# Patient Record
Sex: Female | Born: 1990
Health system: Southern US, Community
[De-identification: ages and names within clinical notes are randomized; demographics above are authoritative.]

## PROBLEM LIST (undated history)

## (undated) DIAGNOSIS — F909 Attention-deficit hyperactivity disorder, unspecified type: Secondary | ICD-10-CM

## (undated) DIAGNOSIS — M1990 Unspecified osteoarthritis, unspecified site: Secondary | ICD-10-CM

## (undated) DIAGNOSIS — F410 Panic disorder [episodic paroxysmal anxiety] without agoraphobia: Secondary | ICD-10-CM

## (undated) DIAGNOSIS — J45909 Unspecified asthma, uncomplicated: Secondary | ICD-10-CM

## (undated) DIAGNOSIS — K8020 Calculus of gallbladder without cholecystitis without obstruction: Secondary | ICD-10-CM

## (undated) HISTORY — DX: Panic disorder (episodic paroxysmal anxiety): F41.0

---
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
## 2010-12-01 ENCOUNTER — Emergency Department
Admission: EM | Admit: 2010-12-01 | Disposition: A | Discharge status: Home / Self Care | Payer: Self-pay | Source: Ambulatory Visit

## 2011-11-02 ENCOUNTER — Emergency Department
Admission: EM | Admit: 2011-11-02 | Disposition: A | Discharge status: Home / Self Care | Payer: Self-pay | Source: Ambulatory Visit

## 2012-01-12 ENCOUNTER — Emergency Department
Admission: EM | Admit: 2012-01-12 | Disposition: A | Discharge status: Home / Self Care | Payer: Self-pay | Source: Ambulatory Visit

## 2012-03-13 ENCOUNTER — Emergency Department
Admission: EM | Admit: 2012-03-13 | Disposition: A | Discharge status: Home / Self Care | Payer: Self-pay | Source: Ambulatory Visit

## 2012-08-13 NOTE — ED Notes (Signed)
[facility]                          EMERGENCY DEPARTMENT REPORT              NAME: Rachael Bradford, Rachael Bradford          DOB:         04-16-1990       MR#:  [id number]                            ACCT#:      [id number]              _________________________________________________________________       DATE OF VISIT:       10/25/2009              BED NUMBER:       25.              TIME OF VISIT:       The patient was seen at 8:10 p.m.              CHIEF COMPLAINT:       "My back and chest hurt."              PRE-HOSPITAL CARE:       None.              HISTORY OF PRESENT ILLNESS:       This is a 22 year old female.  She complains of pain which       started in her upper back.  She denies any injury or trauma,       recent heavy lifting or any exertional activity that would have       caused pain in this area.  She states the pain radiates into her       chest and across her back.  She denies any abdominal pain or       vomiting.  She states the pain is worse with deep inspiration.       She does complain of pain which radiates around to her chest as       well.  She denies any history of heart disease.  She has no       family history of heart disease.  However, mother did have a       pulmonary embolus.              REVIEW OF SYSTEMS:       No nausea, vomiting.  No headache.  No fever, chills.  Positive       nonproductive cough.  Positive chest pain.  No shortness of       breath.  No abdominal pain.  No urinary frequency, diarrhea, or       skin rash.  All other systems negative except as noted per HPI.              PAST MEDICAL HISTORY:       None.              MEDICATIONS:       None.              ALLERGIES:       None.              FAMILY HISTORY:       Significant for PE.  SOCIAL HISTORY:       No current use of alcohol, tobacco, illicit drugs.              PHYSICAL EXAMINATION:       VITAL SIGNS:  Temperature is 36.9, pulse 114, respiratory rate       18, BP  142/92, pulse ox 100% on room air.  Nurse's notes are       reviewed by myself.              GENERAL:  Awake, alert 22 year old female, sitting up on the       stretcher.  She appears somewhat uncomfortable.  She is       nontoxic.       HEENT:  Head and scalp show no contusions, lacerations,       abrasions.  Conjunctivae not pale.  Nares show no nasal       discharge.  Oropharynx moist without lesions.       NECK:  Supple.  No lymphadenopathy, JVD or thyromegaly.       CARDIOVASCULAR:  Slightly tachycardic without murmurs, gallops,       rubs.       LUNGS:  Clear to auscultation in all fields without rales,       rhonchi, or wheezing.  There is some splinting on deep       inspiration.       CHEST WALL:  Nontender to palpation.       ABDOMEN:  Soft, nontender in all quadrants.  No guarding,       rebound, or peritoneal signs.       BACK:  She has mild diffuse tenderness to palpation diffusely       across the entire thoracic area.  Lumbar area is unremarkable.       There is no focal bony point tenderness.  Pelvis is stable.       EXTREMITIES:  Warm, well perfused.  No calf swelling or       tenderness.       SKIN:  Warm, dry, without rashes.       NEUROLOGIC:  She is awake, alert, appropriate without focal       deficits.              IMPRESSION:       This is a 22 year old female with a 1-week history of       progressive back and chest pain.  She is tachycardiac.  Pain is       worse with deep inspiration.  Family history of pulmonary       embolus.  Certainly concern for possible pulmonary embolism.       This could also represent musculoskeletal chest wall and back       pain which is more likely.  Since she has been coughing       frequently this could be diagnosis as well.  I would also have       to consider pneumonia or pneumothorax; however, I think less       likely.  At this point, I will get a CT scan of her chest, chest       x-ray, CBC, Chem 8 for further evaluation.              ANCILLARY SERVICES  PERFORMED:       CBC shows white count 10.1, H and H 15.1 and 43.9, platelet       count  266.  Chest x-ray shows no acute abnormalities.  CT scan       of the chest shows no evidence of pulmonary embolus.  Chem 8 is       unremarkable.              EMERGENCY DEPARTMENT COURSE AND TREATMENT:       The patient remained stable during the course in the emergency       room.  I discussed with her and her family the findings.  EKG       was also performed which showed sinus rhythm with right axis       deviation.  No ischemic changes are noted.  I discussed with the       family the findings.  I suspect with negative CT of her chest       this is more likely musculoskeletal in origin.  I will treat her       with Toradol for pain.  She is to follow up with primary care       physician.  She is return for increasing pain, shortness of       breath, worsening symptoms or other concerns.              FINAL DIAGNOSES:       1.  Thoracic muscle strain.       2.  Chest pain.                                   **PRELIMINARY REPORT UNLESS SIGNED**       SEE DOCUMENT IMAGING SYSTEM FOR FINAL REPORT                                 ________________________________                              Myna Bright, MD                                            ID:   [id number]       DocType:   [id number]       \:   JR       /:   338       DD:  10/25/2009 22:46:35       DT:  10/26/2009 16:42:02       JOB: [id number]       >  Authenticated by Mariea Stable MD On 11/03/2009 09:48:11 AM

## 2012-09-11 ENCOUNTER — Emergency Department
Admission: EM | Admit: 2012-09-11 | Discharge: 2012-09-11 | Disposition: A | Discharge status: Home / Self Care | Payer: Self-pay | Attending: Emergency Medicine | Admitting: Emergency Medicine

## 2012-09-11 ENCOUNTER — Emergency Department: Payer: Self-pay

## 2012-09-11 DIAGNOSIS — G43909 Migraine, unspecified, not intractable, without status migrainosus: Secondary | ICD-10-CM | POA: Insufficient documentation

## 2012-09-11 DIAGNOSIS — Z87891 Personal history of nicotine dependence: Secondary | ICD-10-CM | POA: Insufficient documentation

## 2012-09-11 LAB — ECG 12-LEAD
P Wave Axis: 56 deg
P Wave Duration: 118 ms
P-R Interval: 176 ms
Patient Age: 22 years
Q-T Dispersion: 56 ms
Q-T Interval(Corrected): 433 ms
Q-T Interval: 346 ms
QRS Axis: 86 deg
QRS Duration: 102 ms
T Axis: 48 deg
Ventricular Rate: 94 /min

## 2012-09-11 MED ORDER — PROMETHAZINE HCL 25 MG/ML IJ SOLN
INTRAMUSCULAR | Status: AC
Start: 2012-09-11 — End: ?
  Filled 2012-09-11: qty 1

## 2012-09-11 MED ORDER — DIPHENHYDRAMINE HCL 50 MG/ML IJ SOLN
25.0000 mg | Freq: Once | INTRAMUSCULAR | Status: AC
Start: 2012-09-11 — End: 2012-09-11
  Administered 2012-09-11: 25 mg via INTRAVENOUS

## 2012-09-11 MED ORDER — KETOROLAC TROMETHAMINE 30 MG/ML IJ SOLN
30.0000 mg | Freq: Once | INTRAMUSCULAR | Status: AC
Start: 2012-09-11 — End: 2012-09-11
  Administered 2012-09-11: 30 mg via INTRAVENOUS

## 2012-09-11 MED ORDER — PROMETHAZINE HCL 25 MG/ML IJ SOLN
12.5000 mg | Freq: Once | INTRAMUSCULAR | Status: AC
Start: 2012-09-11 — End: 2012-09-11
  Administered 2012-09-11: 12.5 mg via INTRAVENOUS

## 2012-09-11 MED ORDER — DIPHENHYDRAMINE HCL 50 MG/ML IJ SOLN
INTRAMUSCULAR | Status: AC
Start: 2012-09-11 — End: ?
  Filled 2012-09-11: qty 1

## 2012-09-11 MED ORDER — KETOROLAC TROMETHAMINE 30 MG/ML IJ SOLN
INTRAMUSCULAR | Status: AC
Start: 2012-09-11 — End: ?
  Filled 2012-09-11: qty 1

## 2012-09-11 NOTE — ED Provider Notes (Signed)
[facility] History and Physical Exam      Patient Name: Rachael Bradford,Rachael Bradford  Encounter Date:  09/11/2012  Attending Physician: Myna Bright, MD  PCP: Christa See, MD  Patient DOB:  07/29/1990  MRN:  [id number]  Room:  N5/N5-A      History of Presenting Illness     Chief complaint: Migraine and Chest Pain    HPI/ROS is limited by: none  HPI/ROS given by: patient    Location: frontal head  Duration: 1 day  Severity: moderate    Rachael Bradford is a 22 y.o. female who presents with a migraine HA  Headache: Patient complains of headache. She does have a headache at this time.  She also starting having sharp pain in her chest 2 hours ago.      Description of Headaches:  Location of pain: retro-orbital  Radiation of pain?:occipital  Character of pain:sharp and throbbing  Severity of pain: 8  Accompanying symptoms: photophobia  Prodromal sx?: none  Rapidity of onset: gradual  Typical duration of individual headache: several year  Are most headaches similar in presentation? yes  Typical precipitants:none  Temporal Pattern of Headaches:  Started having HAs several years ago  Worst time of day: no pattern  Awaken from sleep?: no  Seasonal pattern?: no  'Clustering' of HAs over time? no  Overall pattern since problem began: unchanged    Degree of Functional Impairment: mild    Current Use of Meds to Treat HA:  Abortive meds? acetaminophen, NSAIDs)  Daily use? no  Prophylactic meds? none    Additional Relevant History:  History of head/neck trauma? no  History of head/neck surgery? no  Family h/o headache problems? no  Use of meds that might worsen HAs? no  Exposure to carbon monoxide? no  Substance use: caffeine: pt was drinking a "coke" when sx started      Review of Systems     Review of Systems   Constitutional: Negative for fever, chills, diaphoresis and fatigue.   HENT: Negative for ear pain, nosebleeds, congestion, sore throat, neck pain and dental problem.    Eyes: Negative for  pain, discharge, redness and itching.   Respiratory: Positive for chest tightness. Negative for cough, shortness of breath and wheezing.    Cardiovascular: Negative for chest pain, palpitations and leg swelling.   Gastrointestinal: Negative for nausea, vomiting, abdominal pain, diarrhea, constipation and blood in stool.   Genitourinary: Negative for dysuria, urgency, frequency, hematuria, flank pain, decreased urine volume and difficulty urinating.   Musculoskeletal: Negative for myalgias, back pain and joint swelling.   Skin: Negative for pallor, rash and wound.   Neurological: Positive for headaches. Negative for dizziness, syncope, speech difficulty, weakness and numbness.   Hematological: Negative for adenopathy. Does not bruise/bleed easily.   Psychiatric/Behavioral: Negative for suicidal ideas, confusion and agitation.   All other systems reviewed and are negative.      Allergies     Pt  has no known allergies.    Medications     No current outpatient prescriptions on file.     Past Medical History     Pt has no past medical history on file.    Past Surgical History     Pt has no past surgical history on file.    Family History     The family history is not on file.    Social History     Pt reports that she has never smoked. She does not have any smokeless  tobacco history on file. She reports that she drinks alcohol. She reports that she does not use illicit drugs.    Physical Exam     Blood pressure 131/81, pulse 83, temperature 98.2 F (36.8 C), resp. rate 18, height 1.6 m, weight 88.6 kg, last menstrual period 09/09/2012, SpO2 98.00%.  Physical Exam   Nursing note and vitals reviewed.  Constitutional: She is oriented to person, place, and time and well-developed, well-nourished, and in no distress.   HENT:   Head: Normocephalic and atraumatic.   Eyes: Conjunctivae normal and EOM are normal.   Neck: Normal range of motion. Neck supple.   Cardiovascular: Normal rate, regular rhythm and normal heart sounds.     Pulmonary/Chest: Effort normal and breath sounds normal. No respiratory distress.   Abdominal: Soft. There is no tenderness.   Musculoskeletal: Normal range of motion.   Neurological: She is alert and oriented to person, place, and time. She displays facial symmetry. She has a normal Finger-Nose-Finger Test and a normal Romberg Test. She shows no pronator drift. Gait normal. Gait normal.   Reflex Scores:       Patellar reflexes are 2+ on the right side and 2+ on the left side.  Skin: Skin is warm and dry.   Psychiatric: Affect and judgment normal.       Orders Placed     Orders Placed This Encounter   Procedures   . ECG 12 lead       Diagnostic Results         The results of the diagnostic studies below have been reviewed by myself:  EKG- nsr rate 94  Labs  Results     ** No Results found for the last 24 hours. **          Radiologic Studies  Radiology Results (24 Hour)     ** No Results found for the last 24 hours. **                MDM / Critical Care     Blood pressure 131/81, pulse 83, temperature 98.2 F (36.8 C), resp. rate 18, height 1.6 m, weight 88.6 kg, last menstrual period 09/09/2012, SpO2 98.00%.    This patient presents to the Emergency Department with a headache.  Based on my history and examination, several differential diagnoses including cluster headache, tension headache, sinusitis, and migraine have been considered.  Serious or potentially life-threatening causes of the patient's symptoms like meningitis or causes of brain swelling seem unlikely. The patient seems improved, is neurologically intact, and can be discharged home and managed with symptomatic care.  I advised the patient to return to the emergency department immediately should they develop worsening pain, fever, or any acute concerns .  Diagnostic impression and plan were discussed with the patient and/or family.  If ordered, results of lab/radiology tests were reviewed and discussed with the patient and/or family. Questions were  answered and concerns were addressed.  The patient was encouraged to follow-up with their primary care provider or specialist if not improved.      Procedures           Diagnosis / Disposition     Clinical Impression  1. Migraine without status migrainosus, not intractable        Disposition  ED Disposition     Discharge Caprisha Bridgett Marmolejos discharge to home/self care.    Condition at discharge: Good            Prescriptions  New Prescriptions  No medications on file                   Myna Bright, MD  09/11/12 2006

## 2012-09-11 NOTE — ED Notes (Signed)
Frontal migraine since last night, midsternal chest pain started 2 hrs ago. Posterior neck pain but able to move well. Mild sinus congestion.

## 2012-09-11 NOTE — Discharge Instructions (Signed)
Migraine Headache  Migraine headaches are related to changes in blood flow to the brain. This causes throbbing or constant pain on one or both sides of the head. The pain may last from a few hours to several days. There is usually nausea, vomiting, sensitivity to light and sound, and blurred vision. A migraine attack may be triggered by emotional stress, hormone changes during the menstrual cycle, oral contraceptives, alcohol use, certain foods containing tyramine, eye strain, weather changes, missing meals, or too little or too much sleep.  Home Care For This Headache:  1) If you were given pain medicine for this headache, do not drive yourself home . Arrange for a ride, instead. When you get home, try to sleep. You should feel much better when you wake up.  2) Migraine headaches may improve with an ice pack on the forehead or at the base of the skull. Heat to the back of your neck may relieve any neck spasm.  3) Drink only clear liquids or eat a very light diet to avoid nausea/vomiting until symptoms improve.  Preventing Future Headaches:  1) Pay attention to those factors that seem to trigger your headache. Try to avoid them when you can. If you have frequent headaches, it is useful to keep a diary of what you were doing, feeling or eating in the hours before each attack. Show this to your doctor to help find the cause of your headaches.  a) If you feel that stress is a factor in your headaches, look at the sources of stress in your life. Find ways to release the build-up of those stresses by using regular exercise, relaxation methods (yoga, meditation), bio-feedback or simply taking time-out for yourself. For more information about this, consult your doctor or go to a local bookstore and review books and tapes on this subject.  b) Tyramine is a substance present in the following foods : chocolate, yogurt, all cheeses except cottage cheese and cream cheese. smoked or pickled fish and meat (including herring,  caviar, bologna, pepperoni, salami), liver, avocados, bananas, figs, raisins, and red wine. Be aware that these foods may trigger a migraine in some persons. Try taking these foods out of your diet for 1-2 months to see if this reduces headache frequency.  Treating Future Attacks:  1) At the first sign of a headache, take time out if possible. Find a quiet, dark, comfortable place to sit or lie down. Let yourself relax or sleep.  2) An ice pack on the forehead or area of greatest pain may help. If you are having muscle spasm and tightness of the neck, a heating pad and massage to this area may be helpful.  3) If you have been prescribed a medicine to stop a migraine headache, use this at the very first warning sign of the headache (aura or initial pain) for best results.  Follow Up  with your doctor if the headache is not better within the next 24 hours. If you have frequent headaches you should discuss a treatment plan with your primary care doctor. Ask if you can have medicine to take at home the next time you get a bad headache. Poorly controlled chronic headaches may require a referral to a neurologist (headache specialist).  Get Prompt Medical Attention  if any of the following occur:   Your head pain gets worse, or does not improve within 24 hours   Repeated vomiting (can't keep liquids down)   Sinus or ear or throat pain (not already reported)     Fever of 100.4 F (38 C) or higher, or as directed by your healthcare provider   Stiff neck   Extreme drowsiness, confusion or fainting   Dizziness, vertigo (dizziness with spinning sensation)   Weakness of an arm or leg or one side of the face   Difficulty with speech or vision   2000-2014 [employer], [address], [place] [address]. All rights reserved. This information is not intended as a substitute for professional medical care. Always follow your healthcare professional's instructions.

## 2013-04-28 LAB — ECG 12-LEAD
Interpretation Text: BORDERLINE
P Wave Axis: 53 deg
P Wave Axis: 59 deg
P Wave Duration: 104 ms
P Wave Duration: 106 ms
P-R Interval: 168 ms
P-R Interval: 206 ms
Patient Age: 17 years
Patient Age: 19 years
Q-T Dispersion: 56 ms
Q-T Dispersion: 54 ms
Q-T Interval(Corrected): 397 ms
Q-T Interval(Corrected): 392 ms
Q-T Interval: 368 ms
Q-T Interval: 342 ms
QRS Axis: 95 deg
QRS Axis: 93 deg
QRS Duration: 98 ms
QRS Duration: 100 ms
T Axis: 59 deg
T Axis: 49 deg
Ventricular Rate: 77 /min
Ventricular Rate: 89 /min

## 2013-08-15 ENCOUNTER — Emergency Department: Payer: Self-pay

## 2013-08-15 ENCOUNTER — Emergency Department
Admission: EM | Admit: 2013-08-15 | Discharge: 2013-08-15 | Disposition: A | Discharge status: Home / Self Care | Payer: Self-pay | Attending: Emergency Medicine | Admitting: Emergency Medicine

## 2013-08-15 DIAGNOSIS — R519 Headache, unspecified: Secondary | ICD-10-CM

## 2013-08-15 DIAGNOSIS — R51 Headache: Secondary | ICD-10-CM | POA: Insufficient documentation

## 2013-08-15 MED ORDER — BUTALBITAL-APAP-CAFFEINE 50-325-40 MG PO TABS
1.0000 | ORAL_TABLET | ORAL | Status: AC | PRN
Start: 2013-08-15 — End: ?

## 2013-08-15 MED ORDER — IBUPROFEN 600 MG PO TABS
600.0000 mg | ORAL_TABLET | Freq: Four times a day (QID) | ORAL | Status: DC | PRN
Start: 2013-08-15 — End: 2013-08-21

## 2013-08-15 NOTE — ED Notes (Signed)
Pt has full ROM of neck but states it hurts when he moves her neck "sometimes". VSS. All system assessments clear.

## 2013-08-15 NOTE — ED Notes (Signed)
Pt was AAOx3. ABCs intact and no signs of distress noted. Steady Gait. No pt compliant upon leaving the ER. Pt stated that she understood her d/c instructions.

## 2013-08-15 NOTE — Discharge Instructions (Signed)
Headache [Unspecified]    The cause of your headache today is not clear, but it does not appear to be the sign of any serious illness.  Under stress, some people tense the muscles of their shoulder, neck and scalp without knowing it. If this condition lasts long enough, a TENSION HEADACHE can occur.  A MIGRAINE HEADACHE is caused by changes in blood flow to the brain. A migraine attack may be triggered by emotional stress, hormone changes during the menstrual cycle, oral contraceptives, alcohol use, certain foods containing tyramine, eye strain, weather changes, missing meals, lack of sleep or oversleeping.  Other causes of headache include a viral illness with high fever, head injury with concussion, sinus, ear or throat infection, dental pain and TMJ (jaw joint) pain. More serious but less common causes of headache include stroke, brain hemorrhage, brain tumor, meningitis and encephalitis.  Home Care:   If you were given pain medicine for this headache, do not drive yourself home. Arrange for a ride, instead. When you get home, try to sleep. You should feel much better when you wake up.   Apply heat to the back of your neck to relieve neck muscle spasm. Migraine headaches may respond best to an ice pack on the forehead or at the base of the skull.   If you are having nausea or vomiting, follow a light diet until your headache is relieved.   If you have a migraine type headache, use sunglasses when in the daylight or around bright indoor lighting until symptoms improve. Bright glaring light can worsen this kind of headache.  Follow Up  with your doctor if the headache is not better within the next 24 hours. If you have frequent headaches you should discuss a treatment plan with your primary care doctor. By being aware of the earliest signs of headache, and starting treatment right away, you may be able to stop the pain yourself.  Get Prompt Medical Attention  if any of the following occur:   Worsening of  your head pain or no improvement within 24 hours   Repeated vomiting (unable to keep liquids down)   Fever of 100.4F (38C) or higher, or as directed by your healthcare provider   Stiff neck   Extreme drowsiness, confusion or fainting   Dizziness, vertigo (dizziness with spinning sensation)   Weakness of an arm or leg or one side of the face   Difficulty with speech or vision   2000-2014 [employer], [address], [place] [address]. All rights reserved. This information is not intended as a substitute for professional medical care. Always follow your healthcare professional's instructions.

## 2013-08-15 NOTE — ED Notes (Signed)
Pt states that for the last week she has had horrible pain behind her right eye into her head. Pt states, " it is nothing like my usual headaches". Pt states it is a sharp pain that does not go away. AAOx4. PERRLA. ABCs intact and no signs of distress. Steady Gait.

## 2013-08-15 NOTE — ED Provider Notes (Signed)
[facility]  EMERGENCY DEPARTMENT  History and Physical Exam       Patient Name: Rachael, Bradford  Encounter Date:  08/15/2013  Physician Assistant: Boykin Peek, PA-C  Attending Physician: Dedra Skeens, MD  PCP: Christa See, MD  Patient DOB:  10/20/1990  MRN:  [id number]  Room:  E58/E58-A      History of Presenting Illness     Chief complaint: Headache    HPI/ROS given by: Patient    Rachael Bradford is a 23 y.o. female who presents with headache. Present x 1-2 weeks. Describes pain that originates either in the R temporal or R retroorbital area and then spreads across the R side of her head. Occasional photo/phonophobia. No nausea. No fever. No neck stiffness, but certain neck movements trigger the pain. Her pain also seems to be triggered by opening and closing her jaw.        Review of Systems     Review of Systems   Constitutional: Negative for fever, chills and unexpected weight change.   HENT: Negative for congestion, postnasal drip, rhinorrhea and sinus pressure.    Eyes: Positive for photophobia.   Respiratory: Negative for shortness of breath.    Cardiovascular: Negative for chest pain.   Gastrointestinal: Negative for nausea and vomiting.   Musculoskeletal: Positive for neck pain. Negative for neck stiffness.   Neurological: Positive for headaches. Negative for dizziness, weakness and numbness.   Hematological: Does not bruise/bleed easily.   Psychiatric/Behavioral: Negative for agitation.   All other systems reviewed and are negative.         Allergies & Medications     Pt has No Known Allergies.    Discharge Medication List as of 08/15/2013  7:37 PM           Past Medical History     Pt has no past medical history on file.     Past Surgical History     Pt  has no past surgical history on file.     Family History     The family history is not on file.     Social History     Pt reports that she has never smoked. She does not have any smokeless tobacco history  on file. She reports that she drinks alcohol. She reports that she does not use illicit drugs.     Physical Exam     Blood pressure 135/83, pulse 92, temperature 98.2 F (36.8 C), temperature source Oral, resp. rate 18, height 1.6 m, weight 83.6 kg, last menstrual period 07/21/2013, SpO2 99 %.    Constitutional: Well developed, well nourished, active, in no apparent distress.  HENT:   Head: Normocephalic, atraumatic. No scalp tenderness.  Ears: No external lesions.  Nose: No external lesions. No epistaxis or drainage.  Eyes: PERRL. No scleral icterus. No conjunctival injection. EOMI. No papilledema appreciated.  Neck: Trachea is midline. No JVD. Normal range of motion. No apparent masses. Neck supple. No meningismus.   Cardiovascular: Regular rhythm, S1 normal and S2 normal. No murmur heard.  Pulmonary/Chest: Effort normal. Lungs clear to auscultation bilaterally.   Abdominal: Soft, non-tender, non-distended. No masses.   Genitourinay/Anorectal: Defferred  Musculoskeletal: Normal range of motion. No deformity or apparent injury. R TMJ ttp and she experiences reproduction of pain with open/closing of jaw.  Neurological: Pt is alert. Cranial nerves are grossly intact. Moving all extremities without apparent deficit.   Psychiatric: Affect is appropriate. There is no agitation.   Skin: Skin is  warm, dry, well perfused. No rash noted. No cyanosis. No pallor. No apparent wound.       Diagnostic Results     The results of the diagnostic studies below have been reviewed by myself:    Labs  Results    ** No results found for the last 24 hours. **          Radiologic Studies  No results found.       ED Course and Medical Decision Making     ED Medication Orders    None        The differential diagnosis includes, but is not limited to meningitis, intracranial hemorrhage, subarachnoid hemorrhage, epidural hematoma, sinusitis, viral syndrome, temporal arteritis, intracranial mass, normal pressure hydrocephalus, pseudotumor  cerebri,  tension headache, migraine headache, cluster headache, herpes zoster, acute angle closure glaucoma, carbon monoxide poisoning.    Pain reproduced by jaw movements which suggested TMJ, although she also experiences pain independent of this. No meningismus. No sudden onset or other factor suggesting SAH. Will try fioricet and nsaid and pt instructed to f/u with neuro for persistent symptoms.     In addition to the above history, please see nursing notes. Allergies, meds, past medical, family, social hx, and the results of the diagnostic studies performed have been reviewed by myself.    I discussed this case with the attending physician in the emergency department and they agree with the assessment and treatment plan.     This chart was generated by an EMR and may contain errors or omissions not intended by the user.     Procedures / Critical Care     None     Diagnosis / Disposition     Clinical Impression  1. Headache        Disposition  ED Disposition    Discharge Somer Trotter Ramberg discharge to home/self care.    Condition at disposition: Stable              Follow up for Discharged Patients  Karlton Lemon, MD  [address]  [place] [address]  [phone]    Schedule an appointment as soon as possible for a visit        Prescriptions for Discharged Patients  Discharge Medication List as of 08/15/2013  7:37 PM      START taking these medications    Details   butalbital-acetaminophen-caffeine (FIORICET, ESGIC) 50-325-40 MG per tablet Take 1-2 tablets by mouth every 4 (four) hours as needed for Pain. Not to exceed 6 tablets in 24 hours, Starting 08/15/2013, Until Discontinued, Print      ibuprofen (ADVIL,MOTRIN) 600 MG tablet Take 1 tablet (600 mg total) by mouth every 6 (six) hours as needed for Pain., Starting 08/15/2013, Until Discontinued, Print                    Boykin Peek, [place]  08/16/13 [phone]

## 2013-08-21 ENCOUNTER — Emergency Department: Payer: Self-pay

## 2013-08-21 ENCOUNTER — Emergency Department
Admission: EM | Admit: 2013-08-21 | Discharge: 2013-08-21 | Disposition: A | Discharge status: Home / Self Care | Payer: Self-pay | Attending: Emergency Medicine | Admitting: Emergency Medicine

## 2013-08-21 DIAGNOSIS — M26629 Arthralgia of temporomandibular joint, unspecified side: Secondary | ICD-10-CM | POA: Insufficient documentation

## 2013-08-21 DIAGNOSIS — K0889 Other specified disorders of teeth and supporting structures: Secondary | ICD-10-CM | POA: Insufficient documentation

## 2013-08-21 MED ORDER — IBUPROFEN 600 MG PO TABS
ORAL_TABLET | ORAL | Status: AC
Start: 2013-08-21 — End: ?
  Filled 2013-08-21: qty 1

## 2013-08-21 MED ORDER — IBUPROFEN 600 MG PO TABS
600.0000 mg | ORAL_TABLET | Freq: Four times a day (QID) | ORAL | Status: AC | PRN
Start: 2013-08-21 — End: ?

## 2013-08-21 MED ORDER — TRAMADOL HCL 50 MG PO TABS
50.0000 mg | ORAL_TABLET | Freq: Three times a day (TID) | ORAL | Status: DC | PRN
Start: 2013-08-21 — End: 2017-07-08

## 2013-08-21 MED ORDER — IBUPROFEN 600 MG PO TABS
600.0000 mg | ORAL_TABLET | Freq: Once | ORAL | Status: AC
Start: 2013-08-21 — End: 2013-08-21
  Administered 2013-08-21: 600 mg via ORAL

## 2013-08-21 MED ORDER — PENICILLIN V POTASSIUM 500 MG PO TABS
500.0000 mg | ORAL_TABLET | Freq: Four times a day (QID) | ORAL | Status: AC
Start: 2013-08-21 — End: 2013-08-28

## 2013-08-21 NOTE — ED Notes (Addendum)
About three days ago pt starting having pain on right side of cheek. Yesterday pt states the pain started going to her teeth, ear, and behind right eye. Pt c/o "jaw popping sound" when she was eating this AM. Pt states pain is a constant throbbing and rating 10/10. Pt denies trauma to right side of jaw. Denies fever, n/v.

## 2013-08-21 NOTE — ED Provider Notes (Signed)
[facility] EMERGENCY DEPARTMENT History and Physical Exam      Patient Name: Rachael Bradford, TUCKERMAN  Encounter Date:  08/21/2013  Attending Physician: Hall Busing Jilliana Burkes, MD  PCP: Christa See, MD  Patient DOB:  [birth year]  MRN:  [id number]  Room:  W41/W41-A      History of Presenting Illness     Chief complaint: Jaw Pain    HPI/ROS is limited by: none  HPI/ROS given by: patient    Location: jaw  Duration: 3 days  Severity: mild    Rachael Bradford is a 23 y.o. female who presents with right sided jaw pain for the past 3 days. Yesterday she started to develop headaches and a right earache. She woke up this morning and with worsened pain the was spreading across her jaw. She was eating this morning and felt a shock of pain after chewing. She notice clicking and popping with chewing. Pain is exacerbated with opening her mouth. She has a fractured tooth but denies complications from this. This occurred around the end of last year. She denies fevers, chills, sore throat, or cough. She has a history of migraines. She has taken Fioricet in the past but has stopped taking it because it no longer seemed to be working. She has never been to a dentist. She does not have a PCP.      Review of Systems     Review of Systems   Constitutional: Negative for fever and chills.   HENT: Positive for dental problem and ear pain (right). Negative for facial swelling and sore throat.    Respiratory: Negative for cough.    Skin: Negative for color change and rash.   Neurological: Positive for headaches.   Psychiatric/Behavioral: Negative for agitation. The patient is not nervous/anxious.         Allergies     Pt has No Known Allergies.    Medications     Current Outpatient Rx   Name  Route  Sig  Dispense  Refill   . butalbital-acetaminophen-caffeine (FIORICET, ESGIC) 50-325-40 MG per tablet    Oral    Take 1-2 tablets by mouth every 4 (four) hours as needed for Pain. Not to exceed 6 tablets in 24 hours    20 tablet    0     . ibuprofen  (ADVIL,MOTRIN) 600 MG tablet    Oral    Take 1 tablet (600 mg total) by mouth every 6 (six) hours as needed for Pain.    40 tablet    0     . penicillin v potassium (VEETID) 500 MG tablet    Oral    Take 1 tablet (500 mg total) by mouth 4 (four) times daily.    28 tablet    0     . traMADol (ULTRAM) 50 MG tablet    Oral    Take 1-2 tablets (50-100 mg total) by mouth every 8 (eight) hours as needed for Pain.    20 tablet    0     . DISCONTD: ibuprofen (ADVIL,MOTRIN) 600 MG tablet    Oral    Take 1 tablet (600 mg total) by mouth every 6 (six) hours as needed for Pain.    40 tablet    0          Past Medical History     Pt has no past medical history on file.    Past Surgical History     Pt has no past surgical history on file.  Family History     The family history is not on file.    Social History     Pt reports that she has never smoked. She does not have any smokeless tobacco history on file. She reports that she drinks alcohol. She reports that she does not use illicit drugs.    Physical Exam     Blood pressure 138/96, pulse 84, temperature 98.2 F (36.8 C), temperature source Oral, resp. rate 20, height 1.6 m, weight 82.7 kg, last menstrual period 07/21/2013, SpO2 100 %.    Physical Exam   Constitutional: She is oriented to person, place, and time.   HENT:   Right Ear: Tympanic membrane, external ear and ear canal normal.   Left Ear: Tympanic membrane, external ear and ear canal normal.   Mouth/Throat: Mucous membranes are normal. Abnormal dentition. No dental abscesses. No oropharyngeal exudate or posterior oropharyngeal erythema.       Right sided jaw tenderness.   Eyes: Conjunctivae are normal. Pupils are equal, round, and reactive to light.   Neck: Normal range of motion.   Cardiovascular: Normal rate, regular rhythm and normal heart sounds.  Exam reveals no gallop and no friction rub.    No murmur heard.  Pulmonary/Chest: Effort normal and breath sounds normal. No respiratory distress. She has no wheezes.    Musculoskeletal: Normal range of motion.   Neurological: She is alert and oriented to person, place, and time.   Skin: Skin is warm and dry.   Psychiatric: She has a normal mood and affect. Judgment normal.   Nursing note and vitals reviewed.      Orders Placed     No orders of the defined types were placed in this encounter.       Diagnostic Results       The results of the diagnostic studies below have been reviewed by myself:    Labs  Results    ** No results found for the last 24 hours. **          Radiologic Studies  Radiology Results (24 Hour)    ** No results found for the last 24 hours. **            MDM / Critical Care     Blood pressure 138/96, pulse 84, temperature 98.2 F (36.8 C), temperature source Oral, resp. rate 20, height 1.6 m, weight 82.7 kg, last menstrual period 07/21/2013, SpO2 100 %.    The patient presents with uncomplicated dental pain without signs of abscess or serious infection. Antibiotics, pain meds and close re-evaluation with a dentist is recommended. The patient was instructed to return if symptoms worsen. Diagnostic impression and plan were discussed with the patient and/or family.  If ordered the results of lab/radiology tests were discussed with the patient and/or family. All questions were answered and concerns addressed.      Procedures     None    EKG     None    Diagnosis / Disposition     Clinical Impression  1. TMJ arthralgia    2. Pain, dental        Disposition  ED Disposition    Discharge Clevie Prout Emry discharge to home/self care.    Condition at disposition: Stable            Prescriptions  Discharge Medication List as of 08/21/2013 11:36 AM      START taking these medications    Details   penicillin v potassium (VEETID) 500  MG tablet Take 1 tablet (500 mg total) by mouth 4 (four) times daily., Starting 08/21/2013, Until Mon 08/28/13, Print      traMADol (ULTRAM) 50 MG tablet Take 1-2 tablets (50-100 mg total) by mouth every 8 (eight) hours as needed for Pain.,  Starting 08/21/2013, Until Discontinued, Print             Attestations     The documentation recorded by my scribe, Ishmael Holter, accurately reflects the services I personally performed and the decisions made by me.  Hall Busing Nate Perri, MD          Tessa Seaberry, Hall Busing, MD  08/21/13 [phone]

## 2013-08-21 NOTE — Discharge Instructions (Signed)
Dental Pain    A crack or cavity in the tooth, which exposes the sensitive inner area of the tooth can cause tooth pain. An infection in the gum or the root of the tooth can cause pain and swelling. The pain is often made worse by drinking hot or cold fluids, or biting on hard foods. Pain may spread from the tooth to the ear or jaw on the same side.  Home Care:   Avoid hot and cold foods and liquids since your tooth may be sensitive to temperature changes.   If your tooth is chipped or cracked, or if there is a large open cavity, apply OIL OF CLOVES (available over-the-counter in drug stores) directly to the tooth to reduce pain. Some pharmacies carry an over-the-counter "toothache kit." This contains a paste, which can be applied over the exposed tooth to decrease sensitivity.   A cold pack on your jaw over the sore area may help reduce pain.   You may use acetaminophen (Tylenol) or ibuprofen (Motrin, Advil) to control pain, unless another medicine was prescribed. [ NOTE: If you have chronic liver or kidney disease or ever had a stomach ulcer or GI bleeding, talk with your doctor before using these medicines.]   If you have signs of an infection, an antibiotic will be given. Take it as directed.  Follow-Up  as directed with a dentist. Your pain may go away with the treatment given. However, only a dentist can fully evaluate and treat the cause and prevent the pain from coming back again.  TOOTHACHE IS A SIGN OF DISEASE IN YOUR TOOTH AND SHOULD BE EXAMINED AND TREATED BY A DENTIST.  Get Prompt Medical Attention  if any of the following occur:   Your face becomes swollen or red   Pain worsens or spreads to the neck   Fever over 100.4 F (38.0 C)   Unusual drowsiness; headache or stiff neck; weakness or fainting   Pus drains from the tooth   Difficulty swallowing or breathing   [address], [address] [place], [place] [address]. All rights reserved. This information is not intended as  a substitute for professional medical care. Always follow your healthcare professional's instructions.      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 This term describes a group of problems related to the temporomandibular joint (TMJ) and nearby muscles. The TMJ is located where the upper and lower jaws meet. TMD can cause painful and frustrating symptoms. But your doctor can recommend various pain relief methods as part of your treatment. These may include medications and certain types of therapy such as massage or gentle exercise.  Using Medications    Medications may be prescribed to treat TMD. Others may be available over-the-counter. The medication type and dosage will depend on the problem you have. For your safety, tell your doctor if you are currently taking any medications. Also mention any herbs or supplements you are using. Common medications used to treat TMD include:   Anti-inflammatories and analgesics, which treat pain, inflammation, osteoarthritis, and rheumatoid arthritis. Anti-inflammatories reduce swelling, heat, redness, and pain. They also help restore function. Analgesics reduce pain. Nonsteroidal anti-inflammatories (NSAIDs) relieve inflammation as well as pain.   Muscle relaxants, which treat myofascial pain. This is pain that occurs in the soft tissues or muscles around the TMJ. Muscle relaxants help ease muscle tension. This reduces pressure on the TMJ from tight jaw muscles.   Antidepressants,which  can be used to reduce pain or bruxism (teeth grinding). At higher dosages, these medications are used to treat depression. Given at low dosages, antidepressants help relieve TMD symptoms. They can reduce muscle pain. They also raise the level of serotonin, a body chemical that improves sleep. This in turn can decrease bruxism during the night.  Treating Painful Muscles  A trigger point is a painful spot in a  tight muscle. It is often painful to the touch and may refer pain to other places. Your healthcare provider can focus on trigger points using:   Massage,both inside and outside the mouth. This relaxes muscles and improves circulation.   Palpation,which is applying pressure to points of the jaw and face with the fingers.   Cold sprayand stretching of the muscles to relax them.   An anestheticfor pain relief. This may be given as an injection by your dentist.  Treating the Joint  Therapy may focus directly on the TMJ. There are different ways to treat the joint:   A self-care programhelps you treat and manage symptoms on your own. Your program may include exercises. It may also include using ice and heat to relieve pain.   Gentle manipulationreduces pain and restores range of motion. The healthcare provider uses his or her hands to relax muscles and ligaments around the joint.   Exercisesstrengthen muscles in the jaw and face.   Ultrasounduses sound waves to reduce pain and swelling. It also improves pain and swelling.  Treating Inflammation  When the joint is inflamed, movement becomes difficult--even impossible at times. Your healthcare provider can help. Treatment may include:   Rest and gentle exerciseto increase range of motion. One common exercise is to apply pressure to the jaw and resist the movement.   A gel pack or ice wrapped in a towelapplied for10-20 minutes. This eases swelling and reduces pain.   Massage and gentle manipulationas described above.   [address] [place], [place] [address]. All rights reserved. This information is not intended as a substitute for professional medical care. Always follow your healthcare professional's instructions.

## 2013-11-08 ENCOUNTER — Encounter (HOSPITAL_COMMUNITY): Payer: Self-pay | Admitting: Emergency Medicine

## 2013-11-08 DIAGNOSIS — R079 Chest pain, unspecified: Secondary | ICD-10-CM | POA: Insufficient documentation

## 2013-11-08 DIAGNOSIS — R0602 Shortness of breath: Secondary | ICD-10-CM | POA: Insufficient documentation

## 2013-11-08 DIAGNOSIS — K8020 Calculus of gallbladder without cholecystitis without obstruction: Secondary | ICD-10-CM | POA: Insufficient documentation

## 2013-11-08 DIAGNOSIS — Z8659 Personal history of other mental and behavioral disorders: Secondary | ICD-10-CM | POA: Insufficient documentation

## 2013-11-08 DIAGNOSIS — Z79899 Other long term (current) drug therapy: Secondary | ICD-10-CM | POA: Insufficient documentation

## 2013-11-08 LAB — CBC
HCT: 37.9 % (ref 36.0–46.0)
Hemoglobin: 12.6 g/dL (ref 12.0–15.0)
MCH: 28.6 pg (ref 26.0–34.0)
MCHC: 33.2 g/dL (ref 30.0–36.0)
MCV: 86.1 fL (ref 78.0–100.0)
PLATELETS: 265 10*3/uL (ref 150–400)
RBC: 4.4 MIL/uL (ref 3.87–5.11)
RDW: 12.6 % (ref 11.5–15.5)
WBC: 8.5 10*3/uL (ref 4.0–10.5)

## 2013-11-08 LAB — BASIC METABOLIC PANEL
ANION GAP: 14 (ref 5–15)
BUN: 17 mg/dL (ref 6–23)
CALCIUM: 9.9 mg/dL (ref 8.4–10.5)
CHLORIDE: 101 meq/L (ref 96–112)
CO2: 24 meq/L (ref 19–32)
Creatinine, Ser: 0.78 mg/dL (ref 0.50–1.10)
GFR calc Af Amer: >90 mL/min (ref >90)
GFR calc non Af Amer: >90 mL/min (ref >90)
GLUCOSE: 83 mg/dL (ref 70–99)
POTASSIUM: 3.9 meq/L (ref 3.7–5.3)
SODIUM: 139 meq/L (ref 137–147)

## 2013-11-08 LAB — I-STAT TROPONIN, ED: TROPONIN I, POC: 0 ng/mL (ref 0.00–0.08)

## 2013-11-08 NOTE — ED Notes (Signed)
Pt presents with a sudden onset of mid-sternum chest pain radiating to her Left chest and SOB starting approx 2130 this evening. Pt states she went into her house to get her wallet she felt like she was going to pass out.

## 2013-11-09 ENCOUNTER — Emergency Department (HOSPITAL_COMMUNITY): Payer: Self-pay

## 2013-11-09 ENCOUNTER — Emergency Department (HOSPITAL_COMMUNITY)
Admission: EM | Admit: 2013-11-09 | Discharge: 2013-11-09 | Disposition: A | Discharge status: Home / Self Care | Payer: Self-pay | Attending: Emergency Medicine | Admitting: Emergency Medicine

## 2013-11-09 DIAGNOSIS — K8020 Calculus of gallbladder without cholecystitis without obstruction: Secondary | ICD-10-CM

## 2013-11-09 HISTORY — DX: Attention-deficit hyperactivity disorder, unspecified type: F90.9

## 2013-11-09 LAB — HEPATIC FUNCTION PANEL
ALT: 12 U/L (ref 0–35)
AST: 23 U/L (ref 0–37)
Albumin: 4.6 g/dL (ref 3.5–5.2)
Alkaline Phosphatase: 88 U/L (ref 39–117)
BILIRUBIN TOTAL: 0.3 mg/dL (ref 0.3–1.2)
Bilirubin, Direct: <0.2 mg/dL (ref 0.0–0.3)
Total Protein: 8.1 g/dL (ref 6.0–8.3)

## 2013-11-09 MED ORDER — FAMOTIDINE IN NACL 20-0.9 MG/50ML-% IV SOLN
20.0000 mg | Freq: Once | INTRAVENOUS | Status: AC
  Administered 2013-11-09: 20 mg via INTRAVENOUS
  Filled 2013-11-09: qty 50

## 2013-11-09 MED ORDER — MORPHINE SULFATE 4 MG/ML IJ SOLN: 4.0000 mg | Freq: Once | INTRAMUSCULAR | Status: DC

## 2013-11-09 MED ORDER — SODIUM CHLORIDE 0.9 % IV BOLUS (SEPSIS)
1000.0000 mL | Freq: Once | INTRAVENOUS | Status: AC
  Administered 2013-11-09: 1000 mL via INTRAVENOUS

## 2013-11-09 MED ORDER — OXYCODONE-ACETAMINOPHEN 5-325 MG PO TABS: 1.0000 | ORAL_TABLET | ORAL | Status: DC | PRN

## 2013-11-09 MED ORDER — GI COCKTAIL ~~LOC~~: 30.0000 mL | Freq: Once | ORAL | Status: DC

## 2013-11-09 MED ORDER — ONDANSETRON 4 MG PO TBDP: 4.0000 mg | ORAL_TABLET | Freq: Three times a day (TID) | ORAL | Status: DC | PRN

## 2013-11-09 MED ORDER — ONDANSETRON HCL 4 MG/2ML IJ SOLN
4.0000 mg | Freq: Once | INTRAMUSCULAR | Status: AC
  Administered 2013-11-09: 4 mg via INTRAVENOUS
  Filled 2013-11-09: qty 2

## 2013-11-09 MED ORDER — OMEPRAZOLE 20 MG PO CPDR: 20.0000 mg | DELAYED_RELEASE_CAPSULE | Freq: Every day | ORAL | Status: DC

## 2013-11-09 NOTE — Discharge Instructions (Signed)
Please follow up with your primary care physician in 1-2 days. If you do not have one please call the [facility] number listed above. Please take pain medication and/or muscle relaxants as prescribed and as needed for pain. Please do not drive on narcotic pain medication or on muscle relaxants. Please read all discharge instructions and return precautions.   Cholelithiasis Cholelithiasis (also called gallstones) is a form of gallbladder disease in which gallstones form in your gallbladder. The gallbladder is an organ that stores bile made in the liver, which helps digest fats. Gallstones begin as small crystals and slowly grow into stones. Gallstone pain occurs when the gallbladder spasms and a gallstone is blocking the duct. Pain can also occur when a stone passes out of the duct.  RISK FACTORS  Being female.   Having multiple pregnancies. Health care providers sometimes advise removing diseased gallbladders before future pregnancies.   Being obese.  Eating a diet heavy in fried foods and fat.   Being older than 60 years and increasing age.   Prolonged use of medicines containing female hormones.   Having diabetes mellitus.   Rapidly losing weight.   Having a family history of gallstones (heredity).  SYMPTOMS  Nausea.   Vomiting.  Abdominal pain.   Yellowing of the skin (jaundice).   Sudden pain. It may persist from several minutes to several hours.  Fever.   Tenderness to the touch. In some cases, when gallstones do not move into the bile duct, people have no pain or symptoms. These are called "silent" gallstones.  TREATMENT Silent gallstones do not need treatment. In severe cases, emergency surgery may be required. Options for treatment include:  Surgery to remove the gallbladder. This is the most common treatment.  Medicines. These do not always work and may take 6-12 months or more to work.  Shock wave treatment (extracorporeal  biliary lithotripsy). In this treatment an ultrasound machine sends shock waves to the gallbladder to break gallstones into smaller pieces that can pass into the intestines or be dissolved by medicine. HOME CARE INSTRUCTIONS   Only take over-the-counter or prescription medicines for pain, discomfort, or fever as directed by your health care provider.   Follow a low-fat diet until seen again by your health care provider. Fat causes the gallbladder to contract, which can result in pain.   Follow up with your health care provider as directed. Attacks are almost always recurrent and surgery is usually required for permanent treatment.  SEEK IMMEDIATE MEDICAL CARE IF:   Your pain increases and is not controlled by medicines.   You have a fever or persistent symptoms for more than 2-3 days.   You have a fever and your symptoms suddenly get worse.   You have persistent nausea and vomiting.  MAKE SURE YOU:   Understand these instructions.  Will watch your condition.  Will get help right away if you are not doing well or get worse. Document Released: 03/05/2005 Document Revised: 11/09/2012 Document Reviewed: 08/31/2012 [facility] Patient Information 2015 [employer] [place]. This information is not intended to replace advice given to you by your health care provider. Make sure you discuss any questions you have with your health care provider.

## 2013-11-09 NOTE — ED Provider Notes (Signed)
Medical screening examination/treatment/procedure(s) were performed by non-physician practitioner and as supervising physician I was immediately available for consultation/collaboration.   EKG Interpretation None        Loren Raceravid Amanda Steuart, MD 11/09/13 2312

## 2013-11-09 NOTE — ED Provider Notes (Signed)
CSN: [id number]     Arrival date & time 11/08/13  2217 History   First MD Initiated Contact with Patient 11/09/13 0054     Chief Complaint  Patient presents with  . Chest Pain     (Consider location/radiation/quality/duration/timing/severity/associated sxs/prior Treatment) HPI Comments: Patient is a 23 year old female past medical history significant for ADHD presenting to the emergency department for acute onset burning epigastric pain with radiation into chest that began at 9:30 this evening. Patient states her pain has been constant. She endorses associated shortness of breath, had an episode where she felt lightheaded without syncope. Denies any fevers, chills, nausea, vomiting, abdominal pain, diarrhea. No abdominal surgical history. PERC negative.   Patient is a 23 y.o. female presenting with chest pain.  Chest Pain Associated symptoms: shortness of breath   Associated symptoms: no abdominal pain, no nausea, no palpitations and not vomiting     Past Medical History  Diagnosis Date  . ADHD (attention deficit hyperactivity disorder)    History reviewed. No pertinent past surgical history. History reviewed. No pertinent family history. History  Substance Use Topics  . Smoking status: Never Smoker   . Smokeless tobacco: Not on file  . Alcohol Use: No   OB History   Grav Para Term Preterm Abortions TAB SAB Ect Mult Living                 Review of Systems  Respiratory: Positive for shortness of breath.   Cardiovascular: Positive for chest pain. Negative for palpitations and leg swelling.  Gastrointestinal: Negative for nausea, vomiting and abdominal pain.  All other systems reviewed and are negative.     Allergies  Review of patient's allergies indicates not on file.  Home Medications   Prior to Admission medications   Medication Sig Start Date End Date Taking? Authorizing Provider  ibuprofen (ADVIL,MOTRIN) 200 MG tablet Take 400 mg by mouth every 6 (six) hours as  needed for mild pain.   Yes Historical Provider, MD  omeprazole (PRILOSEC) 20 MG capsule Take 1 capsule (20 mg total) by mouth daily. 11/09/13   Makayia Duplessis L Paisyn Guercio, PA-C  ondansetron (ZOFRAN ODT) 4 MG disintegrating tablet Take 1 tablet (4 mg total) by mouth every 8 (eight) hours as needed for nausea or vomiting. 11/09/13   Lise Auer Deshawnda Acrey, PA-C  oxyCODONE-acetaminophen (PERCOCET/ROXICET) 5-325 MG per tablet Take 1 tablet by mouth every 4 (four) hours as needed for severe pain. May take 2 tablets PO q 6 hours for severe pain - Do not take with Tylenol as this tablet already contains tylenol 11/09/13   Javell Blackburn L Zakkiyya Barno, PA-C   BP 115/67  Pulse 73  Temp(Src) 98.3 F (36.8 C) (Oral)  Resp 14  Ht 5\' 8"  (1.727 m)  Wt 150 lb (68.04 kg)  BMI 22.81 kg/m2  SpO2 100%  LMP 11/08/2013 Physical Exam  Nursing note and vitals reviewed. Constitutional: She is oriented to person, place, and time. She appears well-developed and well-nourished.  HENT:  Head: Normocephalic and atraumatic.  Right Ear: External ear normal.  Left Ear: External ear normal.  Nose: Nose normal.  Mouth/Throat: Oropharynx is clear and moist.  Eyes: Conjunctivae are normal.  Neck: Neck supple.  Cardiovascular: Normal rate, regular rhythm and normal heart sounds.   Pulmonary/Chest: Effort normal and breath sounds normal. No respiratory distress.  Abdominal: Soft. Bowel sounds are normal. She exhibits no distension. There is no tenderness. There is no rebound and no guarding.  Musculoskeletal: Normal range of motion. She exhibits no  edema.  Neurological: She is alert and oriented to person, place, and time.  Skin: Skin is warm and dry.    ED Course  Procedures (including critical care time) Medications  morphine 4 MG/ML injection 4 mg (4 mg Intravenous Not Given 11/09/13 0426)  ondansetron (ZOFRAN) injection 4 mg (4 mg Intravenous Given 11/09/13 0222)  famotidine (PEPCID) IVPB 20 mg (0 mg Intravenous Stopped  11/09/13 0316)  sodium chloride 0.9 % bolus 1,000 mL (1,000 mLs Intravenous New Bag/Given 11/09/13 0223)    Labs Review Labs Reviewed  CBC  BASIC METABOLIC PANEL  HEPATIC FUNCTION PANEL  URINALYSIS, ROUTINE W REFLEX MICROSCOPIC  PREGNANCY, URINE  I-STAT TROPOININ, ED    Imaging Review Dg Chest 2 View  11/09/2013   CLINICAL DATA:  Chest pain and shortness of Breath.  EXAM: CHEST  2 VIEW  COMPARISON:  None.  FINDINGS: The heart size and mediastinal contours are within normal limits. Both lungs are clear. The visualized skeletal structures are unremarkable.  IMPRESSION: Normal chest x-ray.   Electronically Signed   By: Loralie ChampagneMark  Gallerani M.D.   On: 11/09/2013 01:34   [place] Abdomen Complete  11/09/2013   CLINICAL DATA:  Epigastric pain. Family history of gallbladder disease  EXAM: ULTRASOUND ABDOMEN COMPLETE  COMPARISON:  None.  FINDINGS: Gallbladder:  1.8 cm mobile, shadowing gallstone. No gallbladder wall thickening or focal tenderness.  Common bile duct:  Diameter: 4 mm.  Liver:  No focal lesion identified. Within normal limits in parenchymal echogenicity.  IVC:  No abnormality visualized.  Pancreas:  Visualized portion unremarkable.  Spleen:  Size and appearance within normal limits.  Right Kidney:  Length: 11 cm. Echogenicity within normal limits. No mass or hydronephrosis visualized.  Left Kidney:  Length: 10 cm. Echogenicity within normal limits. No mass or hydronephrosis visualized.  Abdominal aorta:  No aneurysm visualized.  Other findings:  None.  IMPRESSION: Cholelithiasis.  No acute cholecystitis.   Electronically Signed   By: Tiburcio PeaJonathan  Watts M.D.   On: 11/09/2013 04:00     EKG Interpretation None      MDM   Final diagnoses:  Cholelithiasis without cholangitis    Filed Vitals:   11/09/13 0415  BP: 115/67  Pulse: 73  Temp:   Resp:    Afebrile, NAD, non-toxic appearing, AAOx4. I have reviewed nursing notes, vital signs, and all appropriate lab and imaging results for this  patient. Abdomen soft, no tenderness, no distention. No guarding, rigidity or rebound. Lungs clear to auscultation. No LFT abnormalities. Lipase normal. Ultrasound confirms cholelithiasis without cholecystitis. Discussed symptomatic measures at home including pain management and diet modifications along with general surgery outpatient followup. Patient is agreeable to plan instable discharge.    Jeannetta EllisJennifer L Aadvika Konen, PA-C 11/09/13 0532

## 2013-11-09 NOTE — ED Notes (Signed)
Pt a&ox4 ambulatory at d/c with steady gait, nad

## 2013-11-22 ENCOUNTER — Encounter (HOSPITAL_COMMUNITY): Payer: Self-pay | Admitting: Emergency Medicine

## 2013-11-22 ENCOUNTER — Emergency Department (HOSPITAL_COMMUNITY)
Admission: EM | Admit: 2013-11-22 | Discharge: 2013-11-22 | Disposition: A | Discharge status: Home / Self Care | Payer: Self-pay | Attending: Emergency Medicine | Admitting: Emergency Medicine

## 2013-11-22 ENCOUNTER — Emergency Department (HOSPITAL_COMMUNITY): Payer: Self-pay

## 2013-11-22 DIAGNOSIS — Z8659 Personal history of other mental and behavioral disorders: Secondary | ICD-10-CM | POA: Insufficient documentation

## 2013-11-22 DIAGNOSIS — R110 Nausea: Secondary | ICD-10-CM | POA: Insufficient documentation

## 2013-11-22 DIAGNOSIS — N390 Urinary tract infection, site not specified: Secondary | ICD-10-CM | POA: Insufficient documentation

## 2013-11-22 DIAGNOSIS — Z3202 Encounter for pregnancy test, result negative: Secondary | ICD-10-CM | POA: Insufficient documentation

## 2013-11-22 DIAGNOSIS — R1013 Epigastric pain: Secondary | ICD-10-CM | POA: Insufficient documentation

## 2013-11-22 DIAGNOSIS — Z8719 Personal history of other diseases of the digestive system: Secondary | ICD-10-CM | POA: Insufficient documentation

## 2013-11-22 DIAGNOSIS — Z7982 Long term (current) use of aspirin: Secondary | ICD-10-CM | POA: Insufficient documentation

## 2013-11-22 DIAGNOSIS — Z79899 Other long term (current) drug therapy: Secondary | ICD-10-CM | POA: Insufficient documentation

## 2013-11-22 HISTORY — DX: Calculus of gallbladder without cholecystitis without obstruction: K80.20

## 2013-11-22 LAB — COMPREHENSIVE METABOLIC PANEL
ALT: 12 U/L (ref 0–35)
AST: 19 U/L (ref 0–37)
Albumin: 4.7 g/dL (ref 3.5–5.2)
Alkaline Phosphatase: 86 U/L (ref 39–117)
Anion gap: 14 (ref 5–15)
BILIRUBIN TOTAL: 0.3 mg/dL (ref 0.3–1.2)
BUN: 14 mg/dL (ref 6–23)
CHLORIDE: 101 meq/L (ref 96–112)
CO2: 24 meq/L (ref 19–32)
Calcium: 9.5 mg/dL (ref 8.4–10.5)
Creatinine, Ser: 0.59 mg/dL (ref 0.50–1.10)
GFR calc Af Amer: >90 mL/min (ref >90)
Glucose, Bld: 71 mg/dL (ref 70–99)
Potassium: 3.8 mEq/L (ref 3.7–5.3)
SODIUM: 139 meq/L (ref 137–147)
Total Protein: 8 g/dL (ref 6.0–8.3)

## 2013-11-22 LAB — CBC WITH DIFFERENTIAL/PLATELET
BASOS ABS: 0 10*3/uL (ref 0.0–0.1)
Basophils Relative: 0 % (ref 0–1)
Eosinophils Absolute: 0.3 10*3/uL (ref 0.0–0.7)
Eosinophils Relative: 4 % (ref 0–5)
HCT: 38.1 % (ref 36.0–46.0)
Hemoglobin: 13.5 g/dL (ref 12.0–15.0)
LYMPHS ABS: 2.4 10*3/uL (ref 0.7–4.0)
LYMPHS PCT: 32 % (ref 12–46)
MCH: 30.1 pg (ref 26.0–34.0)
MCHC: 35.4 g/dL (ref 30.0–36.0)
MCV: 84.9 fL (ref 78.0–100.0)
Monocytes Absolute: 0.4 10*3/uL (ref 0.1–1.0)
Monocytes Relative: 5 % (ref 3–12)
Neutro Abs: 4.2 10*3/uL (ref 1.7–7.7)
Neutrophils Relative %: 59 % (ref 43–77)
PLATELETS: 259 10*3/uL (ref 150–400)
RBC: 4.49 MIL/uL (ref 3.87–5.11)
RDW: 12.3 % (ref 11.5–15.5)
WBC: 7.4 10*3/uL (ref 4.0–10.5)

## 2013-11-22 LAB — URINE MICROSCOPIC-ADD ON

## 2013-11-22 LAB — URINALYSIS, ROUTINE W REFLEX MICROSCOPIC
BILIRUBIN URINE: NEGATIVE
GLUCOSE, UA: NEGATIVE mg/dL
Hgb urine dipstick: NEGATIVE
KETONES UR: NEGATIVE mg/dL
Nitrite: NEGATIVE
Protein, ur: NEGATIVE mg/dL
Specific Gravity, Urine: 1.029 (ref 1.005–1.030)
Urobilinogen, UA: 0.2 mg/dL (ref 0.0–1.0)
pH: 5.5 (ref 5.0–8.0)

## 2013-11-22 LAB — POC URINE PREG, ED: PREG TEST UR: NEGATIVE

## 2013-11-22 LAB — LIPASE, BLOOD: Lipase: 37 U/L (ref 11–59)

## 2013-11-22 MED ORDER — FAMOTIDINE 20 MG PO TABS
40.0000 mg | ORAL_TABLET | Freq: Once | ORAL | Status: AC
  Administered 2013-11-22: 40 mg via ORAL
  Filled 2013-11-22: qty 2

## 2013-11-22 MED ORDER — ONDANSETRON HCL 4 MG PO TABS: 4.0000 mg | ORAL_TABLET | Freq: Three times a day (TID) | ORAL | Status: DC | PRN

## 2013-11-22 MED ORDER — GI COCKTAIL ~~LOC~~
30.0000 mL | Freq: Once | ORAL | Status: AC
  Administered 2013-11-22: 30 mL via ORAL
  Filled 2013-11-22: qty 30

## 2013-11-22 MED ORDER — CEPHALEXIN 500 MG PO CAPS: 500.0000 mg | ORAL_CAPSULE | Freq: Four times a day (QID) | ORAL | Status: DC

## 2013-11-22 NOTE — ED Notes (Signed)
Pt presents to department for evaluation of diffuse abdominal pain and nausea. History of gallstones. 7/10 pain upon arrival. Denies urinary symptoms. Denies vaginal discharge/bleeding. Pt is alert and oriented x4. NAD.

## 2013-11-22 NOTE — ED Notes (Signed)
PT monitored by pulse ox, bp cuff, and 5-lead. 

## 2013-11-22 NOTE — Discharge Instructions (Signed)
°Emergency Department Resource Guide °1) Find a Doctor and Pay Out of Pocket °Although you won't have to find out who is covered by your insurance plan, it is a good idea to ask around and get recommendations. You will then need to call the office and see if the doctor you have chosen will accept you as a new patient and what types of options they offer for patients who are self-pay. Some doctors offer discounts or will set up payment plans for their patients who do not have insurance, but you will need to ask so you aren't surprised when you get to your appointment. ° °2) Contact Your Local Health Department °Not all health departments have doctors that can see patients for sick visits, but many do, so it is worth a call to see if yours does. If you don't know where your local health department is, you can check in your phone book. The CDC also has a tool to help you locate your state's health department, and many state websites also have listings of all of their local health departments. ° °3) Find a Walk-in Clinic °If your illness is not likely to be very severe or complicated, you may want to try a walk in clinic. These are popping up all over the country in pharmacies, drugstores, and shopping centers. They're usually staffed by nurse practitioners or physician assistants that have been trained to treat common illnesses and complaints. They're usually fairly quick and inexpensive. However, if you have serious medical issues or chronic medical problems, these are probably not your best option. ° °No Primary Care Doctor: °- Call [facility] at  832-8000 - they can help you locate a primary care doctor that  accepts your insurance, provides certain services, etc. °- Physician Referral Service- 1-800-533-3463 ° °Chronic Pain Problems: °Organization         Address  Phone   Notes  °[facility]  (336) 297-2271 Patients need to be referred by their primary care doctor.  ° °Medication  Assistance: °Organization         Address  Phone   Notes  °[place] [facility] [address] °[place], [place] [address] (336) 641-8030 --Must be a resident of [place] °-- Must have NO insurance coverage whatsoever (no Medicaid/ Medicare, etc.) °-- The pt. MUST have a primary care doctor that directs their care regularly and follows them in the community °  °[facility]  (866) 331-1348   °[employer]  (888) 892-1162   ° °Agencies that provide inexpensive medical care: °Organization         Address  Phone   Notes  °[facility]  (336) 832-8035   °[facility]    (336) 832-7272   °[facility] [address] °[place], [place] [address] (336) 832-4777   °[facility] [address], °[place] (336) 271-4999   °[facility]    (336) 373-0678   °[facility]    (336) 272-1050   °[facility] ° [address], [place] Phone:  (336) 832-4444, Fax:  (336) 832-4440 Hours of Operation:  9 am - 6 pm, M-F.  Also accepts Medicaid/Medicare and self-pay.  °[facility] ° [address], [place] Phone: (336) 832-3150, Fax: (336) 832-3151. Hours of Operation:  8:30 am - 5:30 pm, M-F.  Also accepts Medicaid and self-pay.  °[facility] [place] [address], [place] Phone: (336) 878-6027   °[facility] [address], [place], [place] (336)723-1848, Ext. 123 Mondays & Thursdays: 7-9 AM.  First 15 patients are seen on a first come, first serve basis. °  ° °Medicaid-accepting [place] Providers: ° °Organization         Address  Phone   Notes  °[facility] [address], [place] (336) 641-2100 Also accepts self-pay patients.  °[facility] [address], [place] ° (336) 856-9996   °[facility] [address], [place]  (336) 288-8857   °[facility] [address] [place] [address], [place] (336) 299-7000   °Veita Bland [address], [place]  ° (336) 373-1557 Only accepts [place] Access Medicaid patients after they have their name applied to their card.  ° °Self-Pay (no insurance) in [place]: ° °Organization         Address  Phone   Notes  °Sickle Cell Patients, [facility] [address], [place] (336) 832-1970   °[facility] [address], [place] (336) 832-4400   °[facility] ° [address], [place] (336) 992-4800   °[facility]/Dr. Osei-Bonsu ° [address] [place] [address], [place] or [address], [place] (336) 841-8500 Phone number for both [place] and [place] locations is the same.  °[facility] [address], [place] (336) 299-0000   °[facility] [address] [place] [address], [place] or [address] (336) 852-7530 °(336) 878-2260   °[facility] [address], [place] (336) 350-1642, phone; (336) 294-5005, fax Sees patients 1st and 3rd Saturday of every month.  Must not qualify for public or private insurance (i.e. Medicaid, Medicare, [place] Health Choice, Veterans' Benefits) • Household income should be no more than 200% of the poverty level •The clinic cannot treat you if you are pregnant or think you are pregnant • Sexually transmitted diseases are not treated at the clinic.  ° ° °Dental Care: °Organization         Address  Phone  Notes  °[place] [facility] [address], [place] (336) 641-6152 Accepts children up to age 21 who are enrolled in Medicaid or [place] Health Choice; pregnant women with a Medicaid card; and children who have applied for Medicaid or [place] Health Choice, but were declined, whose parents can pay a reduced fee at time of service.  °[place]  [facility] [place]  [address], [place] (336) 641-7733 Accepts children up to age 21 who are enrolled in Medicaid or [place] Health Choice; pregnant women with a Medicaid card; and children who have applied for Medicaid or [place] Health Choice, but were declined, whose parents can pay a reduced fee at time of service.  °[facility] ° [address], [place] (336) 641-4533 Patients are seen by appointment only. Walk-ins are not accepted. [facility] will see patients 18 years of age and older. °Monday - Tuesday (8am-5pm) °Most Wednesdays (8:30-5pm) °$30 per visit, cash only  °[facility] ° [address], [place] (336) 641-4533 Patients are seen by appointment only. Walk-ins are not accepted. [facility] will see patients 18 years of age and older. °One   Wednesday Evening (Monthly: Volunteer Based).  $30 per visit, cash only  °[facility]  (919) 537-3737 for adults; Children under age 4, call Graduate Pediatric Dentistry at (919) [phone]. Children aged 4-14, please call (919) [phone] to request a pediatric application. ° Dental services are provided in all areas of dental care including fillings, crowns and bridges, complete and partial dentures, implants, gum treatment, root canals, and extractions. Preventive care is also provided. Treatment is provided to both adults and children. °Patients are selected via a lottery and there is often a waiting list. °  °[facility] [address], °[place] ° (336) 763-8833 [email or web address] °  °[facility] [address], [place], [place] (336)723-1848, Ext. 123 Second and Fourth Thursday of each month, opens at 6:30 AM; Clinic ends at 9 AM.  Patients are seen on a first-come first-served basis, and a limited number are seen during each clinic.  ° °[facility] ° [address], [place], [place] (336) 723-7904    Eligibility Requirements °You must have lived in [place], [place], or [place] counties for at least the last three months. °  You cannot be eligible for state or federal sponsored healthcare insurance, including Veterans Administration, Medicaid, or Medicare. °  You generally cannot be eligible for healthcare insurance through your employer.  °  How to apply: °Eligibility screenings are held every Tuesday and Wednesday afternoon from 1:00 pm until 4:00 pm. You do not need an appointment for the interview!  °[facility] [address], [place], [place] 336-631-2330   °[facility]  336-342-8273   °[facility]  336-703-3100   °[facility]  336-570-6415   ° °Behavioral Health Resources in the Community: °Intensive Outpatient Programs °Organization         Address  Phone  Notes  °[facility] [address], [place], [place] 336-878-6098   °[facility] [address], [place], [place] 336-832-9800   °[facility] [address], [place], [place] ° 336-882-2125   °[facility] [address],  °[place], [place] 1-800-853-5163 or 336-641-4981   °Substance Abuse Resources °Organization         Address  Phone  Notes  °[facility]  336-882-2125   °[facility]  336-784-9470   °[facility]  336-285-9073   °[facility]  336-845-3988   °Residential & Outpatient Substance Abuse Program  1-800-659-3381   °Psychological Services °Organization         Address  Phone  Notes  °[facility]  336- 832-9600   °[facility]  336- 378-7881   °[facility] [address], [place] 1-800-853-5163 or 336-641-4981   ° °Mobile Crisis Teams °Organization         Address  Phone  Notes  °[facility], Mobile Crisis Care Unit  1-877-626-1772   °Assertive °[facility] ° [address]  [place], [place] 336-834-9664   °Sharon DeEsch [address] °[place] 336-554-5454   ° °Self-Help/Support Groups °Organization         Address  Phone             Notes  °[facility]  - variety of support groups  336- 373-1402 Call for more information  °Narcotics Anonymous (NA), [facility] [address], °[place]  2 meetings at this location  ° °  Residential Treatment Programs Organization         Address  Phone  Notes  [facility] [address],    [place]  1-610-960-4540   [facility]  [address] [place] [address], [place], [place] 478-295-6213   [facility] [address] [place], [place] 086-578-4696 Admissions: 8am-3pm M-F  [facility] [address]    [place], [place] 295-284-1324   [facility] [address] [place], [place], [place] 401-027-2536   [facility] [address],  [place], [place] 644-034-7425   [facility] Intensive Outpatient [address], [place], [place] 956-387-5643   [facility] ([facility] [address] [place]  [place], [place] 3-295-188-4166 or 754-514-0797   [facility] ([facility] [address] [place], [place] 323-557-3220 Accepts Medicaid  [facility] [address]  [place] 2-542-706-2376 Substance Abuse/Addiction Treatment   [facility]         Address  Phone  Notes  [facility]  306-358-6626   Angie Fava, PhD [address] Knack [place], [place]   9133343063 or (519)049-8862   [facility]   [address] [place], [place] (717)049-8783   [facility] [address] [place], [place] 9108467146 Insurance/Medicaid/sponsorship through [facility] [address]                                    [place], [place] [phone] Therapy/tele-psych/case    [facility] [address], [place] 563-869-5804    Dr. Lolly Mustache  [phone]   [facility]  [employer] [facility] Dept. 1) [address] [place] 2) [address], [place] 3)  [address], [place] [phone] 414-262-1250  (251) 705-4191   [facility] Child Abuse Hotline 915-409-8007 or (408) 770-2331 (After Hours)       Eat a bland diet, avoiding greasy, fatty, fried foods, as well as spicy and acidic foods or beverages.  Avoid eating within the hour or 2 before going to bed or laying down.  Also avoid teas, colas, coffee, chocolate, pepermint and spearment.  Take over the counter pepcid, one tablet by mouth twice a day, for the next 2 to 3 weeks.  May also take over the counter maalox/mylanta, as directed on packaging, as needed for discomfort.  Take the prescriptions as directed.  Call the GI doctor and the General Surgeon tomorrow to schedule a follow up appointment within the next week.  Return to the Emergency Department immediately if worsening.

## 2013-11-22 NOTE — ED Provider Notes (Signed)
CSN: [id number]     Arrival date & time 11/22/13  1223 History   First MD Initiated Contact with Patient 11/22/13 1507     Chief Complaint  Patient presents with  . Abdominal Pain  . Nausea     HPI Pt was seen at 1520.  Per pt, c/o gradual onset and persistence of constant upper abd "pain" that began when she woke up this morning.  Has been associated with nausea.  Describes the abd pain as "burning," with radiation into her lower chest. Pt was evaluated in the ED 2 weeks ago for this complaint, dx gallstones and instructed to f/u with General Surgery. Pt has not f/u as instructed. Denies vomiting/diarrhea, no fevers, no back pain, no rash, no CP/SOB, no black or blood in stools.      Past Medical History  Diagnosis Date  . ADHD (attention deficit hyperactivity disorder)   . Gallstones    History reviewed. No pertinent past surgical history.  History  Substance Use Topics  . Smoking status: Never Smoker   . Smokeless tobacco: Not on file  . Alcohol Use: No    Review of Systems ROS: Statement: All systems negative except as marked or noted in the HPI; Constitutional: Negative for fever and chills. ; ; Eyes: Negative for eye pain, redness and discharge. ; ; ENMT: Negative for ear pain, hoarseness, nasal congestion, sinus pressure and sore throat. ; ; Cardiovascular: Negative for chest pain, palpitations, diaphoresis, dyspnea and peripheral edema. ; ; Respiratory: Negative for cough, wheezing and stridor. ; ; Gastrointestinal: +nausea, abd pain. Negative for vomiting, diarrhea, blood in stool, hematemesis, jaundice and rectal bleeding. . ; ; Genitourinary: Negative for dysuria, flank pain and hematuria. ; ; Musculoskeletal: Negative for back pain and neck pain. Negative for swelling and trauma.; ; Skin: Negative for pruritus, rash, abrasions, blisters, bruising and skin lesion.; ; Neuro: Negative for headache, lightheadedness and neck stiffness. Negative for weakness, altered level of  consciousness , altered mental status, extremity weakness, paresthesias, involuntary movement, seizure and syncope.      Allergies  Review of patient's allergies indicates no known allergies.  Home Medications   Prior to Admission medications   Medication Sig Start Date End Date Taking? Authorizing Provider  aspirin 325 MG tablet Take 325 mg by mouth every 6 (six) hours as needed for headache.   Yes Historical Provider, MD  omeprazole (PRILOSEC) 20 MG capsule Take 1 capsule (20 mg total) by mouth daily. 11/09/13  Yes Jennifer L Piepenbrink, PA-C  oxyCODONE-acetaminophen (PERCOCET/ROXICET) 5-325 MG per tablet Take 1 tablet by mouth every 4 (four) hours as needed for severe pain. May take 2 tablets PO q 6 hours for severe pain - Do not take with Tylenol as this tablet already contains tylenol 11/09/13  Yes Jennifer L Piepenbrink, PA-C  ondansetron (ZOFRAN ODT) 4 MG disintegrating tablet Take 1 tablet (4 mg total) by mouth every 8 (eight) hours as needed for nausea or vomiting. 11/09/13   Victorino Dike L Piepenbrink, PA-C   BP 117/62  Pulse 77  Temp(Src) 97.8 F (36.6 C) (Oral)  Resp 13  Ht  (1.6 m)  Wt 150 lb (68.04 kg)  BMI 26.58 kg/m2  SpO2 100%  LMP 11/08/2013 Physical Exam 1525: Physical examination:  Nursing notes reviewed; Vital signs and O2 SAT reviewed;  Constitutional: Well developed, Well nourished, Well hydrated, In no acute distress; Head:  Normocephalic, atraumatic; Eyes: EOMI, PERRL, No scleral icterus; ENMT: Mouth and pharynx normal, Mucous membranes moist; Neck: Supple,  Full range of motion, No lymphadenopathy; Cardiovascular: Regular rate and rhythm, No murmur, rub, or gallop; Respiratory: Breath sounds clear & equal bilaterally, No rales, rhonchi, wheezes.  Speaking full sentences with ease, Normal respiratory effort/excursion; Chest: Nontender, Movement normal; Abdomen: Soft, +very mild mid-epigastric tenderness to palp. No rebound or guarding. Nondistended, Normal bowel  sounds; Genitourinary: No CVA tenderness; Extremities: Pulses normal, No tenderness, No edema, No calf edema or asymmetry.; Neuro: AA&Ox3, Major CN grossly intact.  Speech clear. No gross focal motor or sensory deficits in extremities.; Skin: Color normal, Warm, Dry.    ED Course  Procedures     EKG Interpretation None      MDM  MDM Reviewed: previous chart, nursing note and vitals Reviewed previous: labs and ultrasound Interpretation: labs and x-ray    Results for orders placed during the hospital encounter of 11/22/13  CBC WITH DIFFERENTIAL      Result Value Ref Range   WBC 7.4  4.0 - 10.5 K/uL   RBC 4.49  3.87 - 5.11 MIL/uL   Hemoglobin 13.5  12.0 - 15.0 g/dL   HCT 81.1  91.4 - 78.2 %   MCV 84.9  78.0 - 100.0 fL   MCH 30.1  26.0 - 34.0 pg   MCHC 35.4  30.0 - 36.0 g/dL   RDW 95.6  21.3 - 08.6 %   Platelets 259  150 - 400 K/uL   Neutrophils Relative % 59  43 - 77 %   Neutro Abs 4.2  1.7 - 7.7 K/uL   Lymphocytes Relative 32  12 - 46 %   Lymphs Abs 2.4  0.7 - 4.0 K/uL   Monocytes Relative 5  3 - 12 %   Monocytes Absolute 0.4  0.1 - 1.0 K/uL   Eosinophils Relative 4  0 - 5 %   Eosinophils Absolute 0.3  0.0 - 0.7 K/uL   Basophils Relative 0  0 - 1 %   Basophils Absolute 0.0  0.0 - 0.1 K/uL  COMPREHENSIVE METABOLIC PANEL      Result Value Ref Range   Sodium 139  137 - 147 mEq/L   Potassium 3.8  3.7 - 5.3 mEq/L   Chloride 101  96 - 112 mEq/L   CO2 24  19 - 32 mEq/L   Glucose, Bld 71  70 - 99 mg/dL   BUN 14  6 - 23 mg/dL   Creatinine, Ser 5.78  0.50 - 1.10 mg/dL   Calcium 9.5  8.4 - 46.9 mg/dL   Total Protein 8.0  6.0 - 8.3 g/dL   Albumin 4.7  3.5 - 5.2 g/dL   AST 19  0 - 37 U/L   ALT 12  0 - 35 U/L   Alkaline Phosphatase 86  39 - 117 U/L   Total Bilirubin 0.3  0.3 - 1.2 mg/dL   GFR calc non Af Amer >90  >90 mL/min   GFR calc Af Amer >90  >90 mL/min   Anion gap 14  5 - 15  LIPASE, BLOOD      Result Value Ref Range   Lipase 37  11 - 59 U/L  URINALYSIS,  ROUTINE W REFLEX MICROSCOPIC      Result Value Ref Range   Color, Urine AMBER (*) YELLOW   APPearance CLOUDY (*) CLEAR   Specific Gravity, Urine 1.029  1.005 - 1.030   pH 5.5  5.0 - 8.0   Glucose, UA NEGATIVE  NEGATIVE mg/dL   Hgb urine dipstick NEGATIVE  NEGATIVE  Bilirubin Urine NEGATIVE  NEGATIVE   Ketones, ur NEGATIVE  NEGATIVE mg/dL   Protein, ur NEGATIVE  NEGATIVE mg/dL   Urobilinogen, UA 0.2  0.0 - 1.0 mg/dL   Nitrite NEGATIVE  NEGATIVE   Leukocytes, UA MODERATE (*) NEGATIVE  URINE MICROSCOPIC-ADD ON      Result Value Ref Range   Squamous Epithelial / LPF MANY (*) RARE   WBC, UA 11-20  <3 WBC/hpf   Bacteria, UA MANY (*) RARE  POC URINE PREG, ED      Result Value Ref Range   Preg Test, Ur NEGATIVE  NEGATIVE   US Abdomen Complete 11/09/2013   CLINICAL DATA:  Epigastric pain. Family history of gallbladder disease  EXAM: ULTRASOUND ABDOMEN COMPLETE  COMPARISON:  None.  FINDINGS: Gallbladder:  1.8 cm mobile, shadowing gallstone. No gallbladder wall thickening or focal tenderness.  Common bile duct:  Diameter: 4 mm.  Liver:  No focal lesion identified. Within normal limits in parenchymal echogenicity.  IVC:  No abnormality visualized.  Pancreas:  Visualized portion unremarkable.  Spleen:  Size and appearance within normal limits.  Right Kidney:  Length: 11 cm. Echogenicity within normal limits. No mass or hydronephrosis visualized.  Left Kidney:  Length: 10 cm. Echogenicity within normal limits. No mass or hydronephrosis visualized.  Abdominal aorta:  No aneurysm visualized.  Other findings:  None.  IMPRESSION: Cholelithiasis.  No acute cholecystitis.   Electronically Signed   By: Tiburcio Pea M.D.   On: 11/09/2013 04:00   Dg Abd Acute W/chest 11/22/2013   CLINICAL DATA:  Epigastric pain.  Smoking history  EXAM: ACUTE ABDOMEN SERIES (ABDOMEN 2 VIEW & CHEST 1 VIEW)  COMPARISON:  11/09/2013 chest extra  FINDINGS: There is no evidence of dilated bowel loops or free intraperitoneal air. No  radiopaque calculi or other significant radiographic abnormality is seen. Heart size and mediastinal contours are within normal limits. Both lungs are clear.  IMPRESSION: Negative abdominal radiographs.  No acute cardiopulmonary disease.   Electronically Signed   By: Tiburcio Pea M.D.   On: 11/22/2013 16:36    1800:  Pt has tol PO well while in the ED without N/V.  No stooling while in the ED.  Abd benign, VSS. Feels better and wants to go home now. Dx and testing d/w pt and family.  Questions answered.  Verb understanding, agreeable to d/c home with outpt f/u.   Samuel Jester, DO 11/24/13 2037

## 2014-02-23 ENCOUNTER — Encounter (HOSPITAL_COMMUNITY): Payer: Self-pay | Admitting: Emergency Medicine

## 2014-02-23 DIAGNOSIS — H532 Diplopia: Secondary | ICD-10-CM | POA: Insufficient documentation

## 2014-02-23 DIAGNOSIS — Z79899 Other long term (current) drug therapy: Secondary | ICD-10-CM | POA: Insufficient documentation

## 2014-02-23 DIAGNOSIS — Z8719 Personal history of other diseases of the digestive system: Secondary | ICD-10-CM | POA: Insufficient documentation

## 2014-02-23 DIAGNOSIS — G4489 Other headache syndrome: Secondary | ICD-10-CM | POA: Insufficient documentation

## 2014-02-23 DIAGNOSIS — Z8659 Personal history of other mental and behavioral disorders: Secondary | ICD-10-CM | POA: Insufficient documentation

## 2014-02-23 DIAGNOSIS — Z7982 Long term (current) use of aspirin: Secondary | ICD-10-CM | POA: Insufficient documentation

## 2014-02-23 NOTE — ED Notes (Signed)
Pt. reports intermittent migraine headache/vertigo onset last month , denies nausea or vomitting , no blurred vision , alert/oriented .

## 2014-02-24 ENCOUNTER — Emergency Department (HOSPITAL_COMMUNITY)
Admission: EM | Admit: 2014-02-24 | Discharge: 2014-02-24 | Disposition: A | Discharge status: Home / Self Care | Payer: Self-pay | Attending: Emergency Medicine | Admitting: Emergency Medicine

## 2014-02-24 DIAGNOSIS — G4489 Other headache syndrome: Secondary | ICD-10-CM

## 2014-02-24 MED ORDER — METOCLOPRAMIDE HCL 5 MG/ML IJ SOLN
10.0000 mg | Freq: Once | INTRAMUSCULAR | Status: AC
  Administered 2014-02-24: 10 mg via INTRAVENOUS

## 2014-02-24 MED ORDER — DIPHENHYDRAMINE HCL 50 MG/ML IJ SOLN
25.0000 mg | Freq: Once | INTRAMUSCULAR | Status: AC
  Administered 2014-02-24: 25 mg via INTRAVENOUS

## 2014-02-24 MED ORDER — KETOROLAC TROMETHAMINE 30 MG/ML IJ SOLN
30.0000 mg | Freq: Once | INTRAMUSCULAR | Status: AC
  Administered 2014-02-24: 30 mg via INTRAVENOUS

## 2014-02-24 NOTE — ED Provider Notes (Signed)
CSN: [id number]     Arrival date & time 02/23/14  2302 History  This chart was scribed for Tanya Sanchez [name], MD by Elon SpannerGarrett Cook, ED Scribe. This patient was seen in room A09C/A09C and the patient's care was started at 12:20 AM.   Chief Complaint  Patient presents with  . Migraine   Patient is a 23 y.o. female presenting with migraines. The history is provided by the patient. No language interpreter was used.  Migraine This is a recurrent problem. The current episode started more than 2 days ago. The problem occurs daily. The problem has been gradually worsening. Associated symptoms include headaches. Pertinent negatives include no abdominal pain. Nothing aggravates the symptoms. Nothing relieves the symptoms.   HPI Comments: Tanya Sanchez is a 23 y.o. female with a history of chronic headache over the past 3 months and no other significant medical history.  She presents to the Emergency Department complaining of an intermittent headache onset 5 days ago with associated intermittent double vision and dizziness.  She reports the headache appears gradually and is both frontal sometimes and global at others.  She describes the sensation as "headspinning".   Patient denies history of brain trauma, brain aneurysm.  Patient denies fever, vomiting, CP, abdominal pain.  She has not been seen for this complaint previously.    Past Medical History  Diagnosis Date  . ADHD (attention deficit hyperactivity disorder)   . Gallstones    History reviewed. No pertinent past surgical history. No family history on file. History  Substance Use Topics  . Smoking status: Never Smoker   . Smokeless tobacco: Not on file  . Alcohol Use: No   OB History    No data available     Review of Systems  Constitutional: Negative for fever.  Eyes: Positive for visual disturbance.  Gastrointestinal: Negative for abdominal pain.  Neurological: Positive for dizziness and headaches.  All other systems reviewed and  are negative.   Allergies  Review of patient's allergies indicates no known allergies.  Home Medications   Prior to Admission medications   Medication Sig Start Date End Date Taking? Authorizing Provider  aspirin 325 MG tablet Take 325 mg by mouth every 6 (six) hours as needed for headache.    Historical Provider, MD  cephALEXin (KEFLEX) 500 MG capsule Take 1 capsule (500 mg total) by mouth 4 (four) times daily. 11/22/13   Samuel JesterKathleen McManus, DO  omeprazole (PRILOSEC) 20 MG capsule Take 1 capsule (20 mg total) by mouth daily. 11/09/13   Jennifer L Piepenbrink, PA-C  ondansetron (ZOFRAN ODT) 4 MG disintegrating tablet Take 1 tablet (4 mg total) by mouth every 8 (eight) hours as needed for nausea or vomiting. 11/09/13   Victorino DikeJennifer L Piepenbrink, PA-C  ondansetron (ZOFRAN) 4 MG tablet Take 1 tablet (4 mg total) by mouth every 8 (eight) hours as needed for nausea or vomiting. 11/22/13   Samuel JesterKathleen McManus, DO  oxyCODONE-acetaminophen (PERCOCET/ROXICET) 5-325 MG per tablet Take 1 tablet by mouth every 4 (four) hours as needed for severe pain. May take 2 tablets PO q 6 hours for severe pain - Do not take with Tylenol as this tablet already contains tylenol 11/09/13   Jennifer L Piepenbrink, PA-C   BP 141/87 mmHg  Pulse 84  Temp(Src) 98 F (36.7 C) (Oral)  Resp 20  Ht 5\' 4"  (1.626 m)  Wt 177 lb 1.6 oz (80.332 kg)  BMI 30.38 kg/m2  SpO2 99%  LMP 02/21/2014 Physical Exam  Nursing note and vitals reviewed.  CONSTITUTIONAL: Well developed/well nourished HEAD: Normocephalic/atraumatic EYES: EOMI/PERRL, no nystagmus, no ptosis, normal fundoscopic exam (no papilledema)  ENMT: Mucous membranes moist NECK: supple no meningeal signs, no bruits SPINE/BACK:entire spine nontender CV: S1/S2 noted, no murmurs/rubs/gallops noted LUNGS: Lungs are clear to auscultation bilaterally, no apparent distress ABDOMEN: soft, nontender, no rebound or guarding GU:no cva tenderness NEURO:Awake/alert, facies symmetric, no arm  or leg drift is noted Equal 5/5 strength with shoulder abduction, elbow flex/extension, wrist flex/extension in upper extremities and equal hand grips bilaterally Equal 5/5 strength with hip flexion,knee flex/extension, foot dorsi/plantar flexion Cranial nerves 3/4/5/6/09/28/08/11/12 tested and intact Gait normal without ataxia No past pointing Sensation to light touch intact in all extremities EXTREMITIES: pulses normal, full ROM SKIN: warm, color normal PSYCH: no abnormalities of mood noted, alert and oriented to situation   ED Course  Procedures   DIAGNOSTIC STUDIES: Oxygen Saturation is 99% on RA, normal by my interpretation.    COORDINATION OF CARE:  12:30 AM discussed plan to order medication for headache.  Advised patient to follow-up with neurologist.  Patient acknowledges and agrees with plan.    At time of discharge, pt felt improved She is in no distress She has no neuro deficits I doubt SAH, and I doubt pseudotumor at this time I feel she is safe/appropriate for outpatient management   Medications  metoCLOPramide (REGLAN) injection 10 mg (10 mg Intravenous Given 02/24/14 0100)  diphenhydrAMINE (BENADRYL) injection 25 mg (25 mg Intravenous Given 02/24/14 0101)  ketorolac (TORADOL) 30 MG/ML injection 30 mg (30 mg Intravenous Given 02/24/14 0101)    MDM   Final diagnoses:  Other headache syndrome    Nursing notes including past medical history and social history reviewed and considered in documentation   I personally performed the services described in this documentation, which was scribed in my presence. The recorded information has been reviewed and is accurate.       Tanya Sanchez Tamie Minteer, MD 02/24/14 [phone]

## 2014-02-24 NOTE — Discharge Instructions (Signed)

## 2014-03-23 DIAGNOSIS — F410 Panic disorder [episodic paroxysmal anxiety] without agoraphobia: Secondary | ICD-10-CM

## 2014-03-23 HISTORY — DX: Panic disorder (episodic paroxysmal anxiety): F41.0

## 2014-06-08 ENCOUNTER — Encounter (HOSPITAL_COMMUNITY): Payer: Self-pay | Admitting: *Deleted

## 2014-06-08 ENCOUNTER — Emergency Department (HOSPITAL_COMMUNITY)
Admission: EM | Admit: 2014-06-08 | Discharge: 2014-06-08 | Disposition: A | Discharge status: Home / Self Care | Payer: Self-pay | Attending: Emergency Medicine | Admitting: Emergency Medicine

## 2014-06-08 DIAGNOSIS — Z8719 Personal history of other diseases of the digestive system: Secondary | ICD-10-CM | POA: Insufficient documentation

## 2014-06-08 DIAGNOSIS — Z8659 Personal history of other mental and behavioral disorders: Secondary | ICD-10-CM | POA: Insufficient documentation

## 2014-06-08 DIAGNOSIS — G5601 Carpal tunnel syndrome, right upper limb: Secondary | ICD-10-CM | POA: Insufficient documentation

## 2014-06-08 DIAGNOSIS — Z7982 Long term (current) use of aspirin: Secondary | ICD-10-CM | POA: Insufficient documentation

## 2014-06-08 MED ORDER — IBUPROFEN 800 MG PO TABS: 800.0000 mg | ORAL_TABLET | Freq: Three times a day (TID) | ORAL | Status: DC

## 2014-06-08 NOTE — ED Provider Notes (Signed)
CSN: [id number]     Arrival date & time 06/08/14  1814 History  This chart was scribed for non-physician practitioner Ivar Drapeob Sima Lindenberger, PA-C working with Rolland PorterMark James, MD by Murriel HopperAlec Bankhead, ED Scribe. This patient was seen in room TR05C/TR05C and the patient's care was started at 7:28 PM.    Chief Complaint  Patient presents with  . Wrist Pain     The history is provided by the patient. No language interpreter was used.    HPI Comments: Tanya Sanchez is a 24 y.o. female who presents to the Emergency Department complaining of worsening intermittent, sharp, shooting right wrist pain that radiates to the elbow and fingers with movement that has been present for a few weeks. 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 Pt tried ibuprofen and tylenol as well as a topical pain reliever with no relief. She denies any fevers chills. Denies any traumatic injury.    Past Medical History  Diagnosis Date  . ADHD (attention deficit hyperactivity disorder)   . Gallstones    History reviewed. No pertinent past surgical history. No family history on file. History  Substance Use Topics  . Smoking status: Never Smoker   . Smokeless tobacco: Not on file  . Alcohol Use: No   OB History    No data available     Review of Systems  Constitutional: Negative for fever and chills.  Respiratory: Negative for shortness of breath.   Cardiovascular: Negative for chest pain.  Gastrointestinal: Negative for nausea, vomiting, diarrhea and constipation.  Genitourinary: Negative for dysuria.  Musculoskeletal: Positive for arthralgias.      Allergies  Review of patient's allergies indicates no known allergies.  Home Medications   Prior to Admission medications   Medication Sig Start Date End Date Taking? Authorizing Provider  acetaminophen (TYLENOL) 325 MG tablet Take 650 mg by mouth every 6 (six) hours as needed for headache.    Historical  Provider, MD  acetaminophen (TYLENOL) 650 MG CR tablet Take 650 mg by mouth every 8 (eight) hours as needed for pain.    Historical Provider, MD  aspirin 325 MG tablet Take 325 mg by mouth every 6 (six) hours as needed for headache.    Historical Provider, MD  aspirin-acetaminophen-caffeine (EXCEDRIN MIGRAINE) 6055595802250-250-65 MG per tablet Take 1 tablet by mouth every 6 (six) hours as needed for headache.    Historical Provider, MD  ondansetron (ZOFRAN ODT) 4 MG disintegrating tablet Take 1 tablet (4 mg total) by mouth every 8 (eight) hours as needed for nausea or vomiting. Patient not taking: Reported on 02/24/2014 11/09/13   Francee PiccoloJennifer Piepenbrink, PA-C  ondansetron (ZOFRAN) 4 MG tablet Take 1 tablet (4 mg total) by mouth every 8 (eight) hours as needed for nausea or vomiting. Patient not taking: Reported on 02/24/2014 11/22/13   Samuel JesterKathleen McManus, DO  oxyCODONE-acetaminophen (PERCOCET/ROXICET) 5-325 MG per tablet Take 1 tablet by mouth every 4 (four) hours as needed for severe pain. May take 2 tablets PO q 6 hours for severe pain - Do not take with Tylenol as this tablet already contains tylenol Patient not taking: Reported on 02/24/2014 11/09/13   Victorino DikeJennifer Piepenbrink, PA-C   BP 135/90 mmHg  Pulse 77  Temp(Src) 98 F (36.7 C) (Oral)  Resp 16  SpO2 98% Physical Exam  Constitutional: She is oriented to person, place, and time. She appears well-developed and well-nourished.  HENT:  Head: Normocephalic and atraumatic.  Eyes:  Conjunctivae and EOM are normal.  Neck: Normal range of motion.  Cardiovascular: Normal rate.   Pulmonary/Chest: Effort normal.  Abdominal: She exhibits no distension.  Musculoskeletal: Normal range of motion.  Right wrist positive Tinel and Phalen sign, no range of motion or strength deficits, no bony abnormality or deformity  Neurological: She is alert and oriented to person, place, and time.  Skin: Skin is dry.  No cellulitis, erythema, or sign of infection  Psychiatric: She  has a normal mood and affect. Her behavior is normal. Judgment and thought content normal.  Nursing note and vitals reviewed.   ED Course  Procedures (including critical care time)  DIAGNOSTIC STUDIES: Oxygen Saturation is 98% on RA, normal by my interpretation.    COORDINATION OF CARE: 7:32 PM Discussed treatment plan with pt at bedside and pt agreed to plan.   Labs Review Labs Reviewed - No data to display  Imaging Review No results found.   EKG Interpretation None      MDM   Final diagnoses:  Carpal tunnel syndrome of right wrist    Patient with symptoms consistent with carpal tunnel syndrome. No indication for imaging at this time. No traumatic mechanism of injury. She uses her wrists on a daily basis at work whether it as sweeping, mopping, or lifting heavy boxes. I will give her a wrist up splint and recommend rice therapy and NSAIDs. Recommend primary care/orthopedic hand follow-up. Patient understands and agrees with the plan. She is stable and ready for discharge.   I personally performed the services described in this documentation, which was scribed in my presence. The recorded information has been reviewed and is accurate.     Roxy Horseman, PA-C 06/08/14 1948  Rolland Porter, MD 06/09/14 2222

## 2014-06-08 NOTE — ED Notes (Signed)
Pt reports right wrist and arm pain. States that she does a lot of lifting and repetitive movements at work.

## 2014-06-08 NOTE — Discharge Instructions (Signed)
Carpal Tunnel Syndrome °Carpal tunnel syndrome is a disorder of the nervous system in the wrist that causes pain, hand weakness, and/or loss of feeling. Carpal tunnel syndrome is caused by the compression, stretching, or irritation of the median nerve at the wrist joint. Athletes who experience carpal tunnel syndrome may notice a decrease in their performance to the condition, especially for sports that require strong hand or wrist action.  °SYMPTOMS  °· Tingling, numbness, or burning pain in the hand or fingers. °· Inability to sleep due to pain in the hand. °· Sharp pains that shoot from the wrist up the arm or to the fingers, especially at night. °· Morning stiffness or cramping of the hand. °· Thumb weakness, resulting in difficulty holding objects or making a fist. °· Shiny, dry skin on the hand. °· Reduced performance in any sport requiring a strong grip. °CAUSES  °· Median nerve damage at the wrist is caused by pressure due to swelling, inflammation, or scarred tissue. °· Sources of pressure include: °¨ Repetitive gripping or squeezing that causes inflammation of the tendon sheaths. °¨ Scarring or shortening of the ligament that covers the median nerve. °¨ Traumatic injury to the wrist or forearm such as fracture, sprain, or dislocation. °¨ Prolonged hyperextension (wrist bent backward) or hyperflexion (wrist bent downward) of the wrist. °RISK INCREASES WITH: °· Diabetes mellitus. °· Menopause or amenorrhea. °· Rheumatoid arthritis. °· Raynaud disease. °· Pregnancy. °· Gout. °· Kidney disease. °· Ganglion cyst. °· Repetitive hand or wrist action. °· Hypothyroidism (underactive thyroid gland). °· Repetitive jolting or shaking of the hands or wrist. °· Prolonged forceful weight-bearing on the hands. °PREVENTION °· Bracing the hand and wrist straight during activities that involve repetitive grasping. °· For activities that require prolonged extension of the wrist (bending towards the top of the forearm)  periodically change the position of your wrists. °· Learn and use proper technique in activities that result in the wrist position in neutral to slight extension. °· Avoid bending the wrist into full extension or flexion (up or down). °· Keep the wrist in a straight (neutral) position. To keep the wrist in this position, wear a splint. °· Avoid repetitive hand and wrist motions. °· When possible avoid prolonged grasping of items (steering wheel of a car, a pen, a vacuum cleaner, or a rake). °· Loosen your grip for activities that require prolonged grasping of items. °· Place keyboards and writing surfaces at the correct height as to decrease strain on the wrist and hand. °· Alternate work tasks to avoid prolonged wrist flexion. °· Avoid pinching activities (needlework and writing) as they may irritate your carpal tunnel syndrome. °· If these activities are necessary, complete them for shorter periods of time. °· When writing, use a felt tip or rollerball pen and/or build up the grip on a pen to decrease the forces required for writing. °PROGNOSIS  °Carpal tunnel syndrome is usually curable with appropriate conservative treatment and sometimes resolves spontaneously. For some cases, surgery is necessary, especially if muscle wasting or nerve changes have developed.  °RELATED COMPLICATIONS  °· Permanent numbness and a weak thumb or fingers in the affected hand. °· Permanent paralysis of a portion of the hand and fingers. °TREATMENT  °Treatment initially consists of stopping activities that aggravate the symptoms as well as medication and ice to reduce inflammation. A wrist splint is often recommended for wear during activities of repetitive motion as well as at night. It is also important to learn and use proper technique when   performing activities that typically cause pain. On occasion, a corticosteroid injection may be given. °If symptoms persist despite conservative treatment, surgery may be an option. Surgical  techniques free the pinched or compressed nerve. Carpal tunnel surgery is usually performed on an outpatient basis, meaning you go home the same day as surgery. These procedures provide almost complete relief of all symptoms in 95% of patients. Expect at least 2 weeks for healing after surgery. For cases that are the result of repeated jolting or shaking of the hand or wrist or prolonged hyperextension, surgery is not usually recommended because stretching of the median nerve, not compression, is usually the cause of carpal tunnel syndrome in these cases. °MEDICATION  °· If pain medication is necessary, nonsteroidal anti-inflammatory medications, such as aspirin and ibuprofen, or other minor pain relievers, such as acetaminophen, are often recommended. °· Do not take pain medication for 7 days before surgery. °· Prescription pain relievers are usually only prescribed after surgery. Use only as directed and only as much as you need. °· Corticosteroid injections may be given to reduce inflammation. However, they are not always recommended. °· Vitamin B6 (pyridoxine) may reduce symptoms; use only if prescribed for your disorder. °SEEK MEDICAL CARE IF:  °· Symptoms get worse or do not improve in 2 weeks despite treatment. °· You also have a current or recent history of neck or shoulder injury that has resulted in pain or tingling elsewhere in your arm. °Document Released: 03/09/2005 Document Revised: 07/24/2013 Document Reviewed: 06/21/2008 °Exit[employer]® Patient Information ©2015 [employer]. This information is not intended to replace advice given to you by your health care provider. Make sure you discuss any questions you have with your health care provider. ° °

## 2014-06-08 NOTE — ED Notes (Addendum)
Pt sts a few days ago the pain started in her wrist.  Denies recalling any specific injury.  Says she does a lot of heavy lifting for work.  Pt sts pain began to radiate down to right ring finger with movement, and then today pain began to shoot up through her elbow and into her shoulder blade.  Pt sts pain is worse in her elbow at this time, and any movement causing shooting pain up to her shoulder.   Pt sts she has tried ibuprofen and tylenol as well as muscle rub without any relief.

## 2014-07-14 ENCOUNTER — Encounter (HOSPITAL_COMMUNITY): Payer: Self-pay | Admitting: Emergency Medicine

## 2014-07-14 ENCOUNTER — Emergency Department (HOSPITAL_COMMUNITY)
Admission: EM | Admit: 2014-07-14 | Discharge: 2014-07-15 | Disposition: A | Discharge status: Home / Self Care | Payer: Self-pay | Attending: Emergency Medicine | Admitting: Emergency Medicine

## 2014-07-14 DIAGNOSIS — Z8659 Personal history of other mental and behavioral disorders: Secondary | ICD-10-CM | POA: Insufficient documentation

## 2014-07-14 DIAGNOSIS — Z791 Long term (current) use of non-steroidal anti-inflammatories (NSAID): Secondary | ICD-10-CM | POA: Insufficient documentation

## 2014-07-14 DIAGNOSIS — R079 Chest pain, unspecified: Secondary | ICD-10-CM | POA: Insufficient documentation

## 2014-07-14 DIAGNOSIS — Z3202 Encounter for pregnancy test, result negative: Secondary | ICD-10-CM | POA: Insufficient documentation

## 2014-07-14 DIAGNOSIS — R000 Tachycardia, unspecified: Secondary | ICD-10-CM | POA: Insufficient documentation

## 2014-07-14 DIAGNOSIS — Z8719 Personal history of other diseases of the digestive system: Secondary | ICD-10-CM | POA: Insufficient documentation

## 2014-07-14 DIAGNOSIS — R0602 Shortness of breath: Secondary | ICD-10-CM | POA: Insufficient documentation

## 2014-07-14 LAB — CBC WITH DIFFERENTIAL/PLATELET
Basophils Absolute: 0.1 10*3/uL (ref 0.0–0.1)
Basophils Relative: 1 % (ref 0–1)
EOS ABS: 0.4 10*3/uL (ref 0.0–0.7)
Eosinophils Relative: 5 % (ref 0–5)
HCT: 41.4 % (ref 36.0–46.0)
Hemoglobin: 14.7 g/dL (ref 12.0–15.0)
LYMPHS ABS: 2.1 10*3/uL (ref 0.7–4.0)
Lymphocytes Relative: 29 % (ref 12–46)
MCH: 30.1 pg (ref 26.0–34.0)
MCHC: 35.5 g/dL (ref 30.0–36.0)
MCV: 84.8 fL (ref 78.0–100.0)
Monocytes Absolute: 0.8 10*3/uL (ref 0.1–1.0)
Monocytes Relative: 11 % (ref 3–12)
NEUTROS PCT: 54 % (ref 43–77)
Neutro Abs: 3.9 10*3/uL (ref 1.7–7.7)
Platelets: 258 10*3/uL (ref 150–400)
RBC: 4.88 MIL/uL (ref 3.87–5.11)
RDW: 12 % (ref 11.5–15.5)
WBC: 7.2 10*3/uL (ref 4.0–10.5)

## 2014-07-14 LAB — COMPREHENSIVE METABOLIC PANEL
ALT: 15 U/L (ref 0–35)
AST: 19 U/L (ref 0–37)
Albumin: 4.2 g/dL (ref 3.5–5.2)
Alkaline Phosphatase: 79 U/L (ref 39–117)
Anion gap: 9 (ref 5–15)
BUN: 11 mg/dL (ref 6–23)
CALCIUM: 9.6 mg/dL (ref 8.4–10.5)
CO2: 24 mmol/L (ref 19–32)
CREATININE: 0.66 mg/dL (ref 0.50–1.10)
Chloride: 102 mmol/L (ref 96–112)
GFR calc Af Amer: >90 mL/min (ref >90)
GFR calc non Af Amer: >90 mL/min (ref >90)
Glucose, Bld: 83 mg/dL (ref 70–99)
Potassium: 3.3 mmol/L — ABNORMAL LOW (ref 3.5–5.1)
Sodium: 135 mmol/L (ref 135–145)
TOTAL PROTEIN: 7.2 g/dL (ref 6.0–8.3)
Total Bilirubin: 0.5 mg/dL (ref 0.3–1.2)

## 2014-07-14 LAB — I-STAT VENOUS BLOOD GAS, ED
Acid-base deficit: 1 mmol/L (ref 0.0–2.0)
Bicarbonate: 22.9 mEq/L (ref 20.0–24.0)
O2 SAT: 65 %
PH VEN: 7.41 — AB (ref 7.250–7.300)
TCO2: 24 mmol/L (ref 0–100)
pCO2, Ven: 36.2 mmHg — ABNORMAL LOW (ref 45.0–50.0)
pO2, Ven: 33 mmHg (ref 30.0–45.0)

## 2014-07-14 LAB — URINALYSIS, ROUTINE W REFLEX MICROSCOPIC
BILIRUBIN URINE: NEGATIVE
Glucose, UA: NEGATIVE mg/dL
HGB URINE DIPSTICK: NEGATIVE
Ketones, ur: NEGATIVE mg/dL
Nitrite: NEGATIVE
PH: 7.5 (ref 5.0–8.0)
PROTEIN: NEGATIVE mg/dL
Specific Gravity, Urine: 1.01 (ref 1.005–1.030)
UROBILINOGEN UA: 0.2 mg/dL (ref 0.0–1.0)

## 2014-07-14 LAB — URINE MICROSCOPIC-ADD ON

## 2014-07-14 LAB — POC URINE PREG, ED: PREG TEST UR: NEGATIVE

## 2014-07-14 LAB — I-STAT TROPONIN, ED: TROPONIN I, POC: 0 ng/mL (ref 0.00–0.08)

## 2014-07-14 LAB — D-DIMER, QUANTITATIVE (NOT AT ARMC)

## 2014-07-14 LAB — LIPASE, BLOOD: Lipase: 44 U/L (ref 11–59)

## 2014-07-14 MED ORDER — LORAZEPAM 1 MG PO TABS
1.0000 mg | ORAL_TABLET | Freq: Once | ORAL | Status: AC
  Administered 2014-07-14: 1 mg via ORAL
  Filled 2014-07-14: qty 1

## 2014-07-14 MED ORDER — MORPHINE SULFATE 4 MG/ML IJ SOLN
4.0000 mg | Freq: Once | INTRAMUSCULAR | Status: AC
Start: 2014-07-14 — End: 2014-07-14
  Administered 2014-07-14: 4 mg via INTRAVENOUS
  Filled 2014-07-14: qty 1

## 2014-07-14 NOTE — ED Notes (Signed)
Dr Anitra LauthPlunkett given a copy of venous blood gas

## 2014-07-14 NOTE — ED Notes (Signed)
Patient here with numerous complaints. States that she began experiencing chest pain tonight while at dinner; then shortness of breath, bilateral arm numbness, dizziness, and left abdominal pain radiating to the posterior. Denies history of the same or anxiety attacks. Appears in no distress in triage, but states shortness of breath and has difficulty completing sentences without taking a breath.

## 2014-07-14 NOTE — ED Provider Notes (Signed)
CSN: [id number]     Arrival date & time 07/14/14  1907 History   First MD Initiated Contact with Patient 07/14/14 2031     Chief Complaint  Patient presents with  . Chest Pain  . Abdominal Pain  . Shortness of Breath     (Consider location/radiation/quality/duration/timing/severity/associated sxs/prior Treatment) HPI Comments: Patient presents to the emergency department with chief complaint of shortness of breath and chest pain that started this morning. She states that she has had progressively worsening shortness breath. States that tonight she began to experience chest pain. Reports some pain that radiates to her arms. She denies any history of the same. Denies any history of anxiety. She denies any recent travel, surgery, immobilization, exogenous estrogen use. Denies any history of PE or DVT. Denies history of ACS. There are no aggravating or alleviating factors. Patient has not taken anything for her symptoms.  The history is provided by the patient. No language interpreter was used.    Past Medical History  Diagnosis Date  . ADHD (attention deficit hyperactivity disorder)   . Gallstones    History reviewed. No pertinent past surgical history. History reviewed. No pertinent family history. History  Substance Use Topics  . Smoking status: Never Smoker   . Smokeless tobacco: Not on file  . Alcohol Use: No   OB History    No data available     Review of Systems  Constitutional: Negative for fever and chills.  Respiratory: Positive for shortness of breath.   Cardiovascular: Positive for chest pain.  Gastrointestinal: Negative for nausea, vomiting, diarrhea and constipation.  Genitourinary: Negative for dysuria.  All other systems reviewed and are negative.     Allergies  Review of patient's allergies indicates no known allergies.  Home Medications   Prior to Admission medications   Medication Sig Start Date End Date Taking? Authorizing Provider  acetaminophen  (TYLENOL) 325 MG tablet Take 650 mg by mouth every 6 (six) hours as needed for headache.   Yes Historical Provider, MD  aspirin-acetaminophen-caffeine (EXCEDRIN MIGRAINE) [phone] MG per tablet Take 1 tablet by mouth every 6 (six) hours as needed for headache.   Yes Historical Provider, MD  ibuprofen (ADVIL,MOTRIN) 800 MG tablet Take 1 tablet (800 mg total) by mouth 3 (three) times daily. 06/08/14   Roxy Horseman, PA-C  ondansetron (ZOFRAN ODT) 4 MG disintegrating tablet Take 1 tablet (4 mg total) by mouth every 8 (eight) hours as needed for nausea or vomiting. Patient not taking: Reported on 02/24/2014 11/09/13   Francee Piccolo, PA-C  ondansetron (ZOFRAN) 4 MG tablet Take 1 tablet (4 mg total) by mouth every 8 (eight) hours as needed for nausea or vomiting. Patient not taking: Reported on 02/24/2014 11/22/13   Samuel Jester, DO  oxyCODONE-acetaminophen (PERCOCET/ROXICET) 5-325 MG per tablet Take 1 tablet by mouth every 4 (four) hours as needed for severe pain. May take 2 tablets PO q 6 hours for severe pain - Do not take with Tylenol as this tablet already contains tylenol Patient not taking: Reported on 02/24/2014 11/09/13   Victorino Dike Piepenbrink, PA-C   BP 137/82 mmHg  Pulse 78  Temp(Src) 97.8 F (36.6 C)  Resp 17  Ht  (1.6 m)  Wt 170 lb (77.111 kg)  BMI 30.12 kg/m2  SpO2 100% Physical Exam  Constitutional: She is oriented to person, place, and time. She appears well-developed and well-nourished.  HENT:  Head: Normocephalic and atraumatic.  Eyes: Conjunctivae and EOM are normal. Pupils are equal, round, and reactive to  light.  Neck: Normal range of motion. Neck supple.  Cardiovascular: Regular rhythm.  Exam reveals no gallop and no friction rub.   No murmur heard. Tachycardic  Pulmonary/Chest: Effort normal and breath sounds normal. No respiratory distress. She has no wheezes. She has no rales. She exhibits no tenderness.  Speaks in 3 word sentences, increased work of  breathing, lungs are clear to auscultation bilaterally  Abdominal: Soft. Bowel sounds are normal. She exhibits no distension and no mass. There is no tenderness. There is no rebound and no guarding.  Musculoskeletal: Normal range of motion. She exhibits no edema or tenderness.  No lower extremity swelling, or calf tenderness bilaterally  Neurological: She is alert and oriented to person, place, and time.  Skin: Skin is warm and dry.  Psychiatric: She has a normal mood and affect. Her behavior is normal. Judgment and thought content normal.  Nursing note and vitals reviewed.   ED Course  Procedures (including critical care time) Results for orders placed or performed during the hospital encounter of 07/14/14  CBC with Differential  Result Value Ref Range   WBC 7.2 4.0 - 10.5 K/uL   RBC 4.88 3.87 - 5.11 MIL/uL   Hemoglobin 14.7 12.0 - 15.0 g/dL   HCT 16.141.4 09.636.0 - 04.546.0 %   MCV 84.8 78.0 - 100.0 fL   MCH 30.1 26.0 - 34.0 pg   MCHC 35.5 30.0 - 36.0 g/dL   RDW 40.912.0 81.111.5 - 91.415.5 %   Platelets 258 150 - 400 K/uL   Neutrophils Relative % 54 43 - 77 %   Neutro Abs 3.9 1.7 - 7.7 K/uL   Lymphocytes Relative 29 12 - 46 %   Lymphs Abs 2.1 0.7 - 4.0 K/uL   Monocytes Relative 11 3 - 12 %   Monocytes Absolute 0.8 0.1 - 1.0 K/uL   Eosinophils Relative 5 0 - 5 %   Eosinophils Absolute 0.4 0.0 - 0.7 K/uL   Basophils Relative 1 0 - 1 %   Basophils Absolute 0.1 0.0 - 0.1 K/uL  Comprehensive metabolic panel  Result Value Ref Range   Sodium 135 135 - 145 mmol/L   Potassium 3.3 (L) 3.5 - 5.1 mmol/L   Chloride 102 96 - 112 mmol/L   CO2 24 19 - 32 mmol/L   Glucose, Bld 83 70 - 99 mg/dL   BUN 11 6 - 23 mg/dL   Creatinine, Ser 7.820.66 0.50 - 1.10 mg/dL   Calcium 9.6 8.4 - 95.610.5 mg/dL   Total Protein 7.2 6.0 - 8.3 g/dL   Albumin 4.2 3.5 - 5.2 g/dL   AST 19 0 - 37 U/L   ALT 15 0 - 35 U/L   Alkaline Phosphatase 79 39 - 117 U/L   Total Bilirubin 0.5 0.3 - 1.2 mg/dL   GFR calc non Af Amer >90 >90 mL/min    GFR calc Af Amer >90 >90 mL/min   Anion gap 9 5 - 15  Lipase, blood  Result Value Ref Range   Lipase 44 11 - 59 U/L  Urinalysis, Routine w reflex microscopic  Result Value Ref Range   Color, Urine YELLOW YELLOW   APPearance CLOUDY (A) CLEAR   Specific Gravity, Urine 1.010 1.005 - 1.030   pH 7.5 5.0 - 8.0   Glucose, UA NEGATIVE NEGATIVE mg/dL   Hgb urine dipstick NEGATIVE NEGATIVE   Bilirubin Urine NEGATIVE NEGATIVE   Ketones, ur NEGATIVE NEGATIVE mg/dL   Protein, ur NEGATIVE NEGATIVE mg/dL   Urobilinogen, UA 0.2  0.0 - 1.0 mg/dL   Nitrite NEGATIVE NEGATIVE   Leukocytes, UA MODERATE (A) NEGATIVE  Urine microscopic-add on  Result Value Ref Range   Squamous Epithelial / LPF FEW (A) RARE   WBC, UA 3-6 <3 WBC/hpf   RBC / HPF 0-2 <3 RBC/hpf   Bacteria, UA RARE RARE  D-dimer, quantitative  Result Value Ref Range   D-Dimer, Quant <0.27 0.00 - 0.48 ug/mL-FEU  I-stat troponin, ED (only if pt is 24 y.o. or older & pain is above umbilicus) - do not order at [facility]  Result Value Ref Range   Troponin i, poc 0.00 0.00 - 0.08 ng/mL   Comment 3          POC Urine Pregnancy, ED  (If Pre-menopausal female) - do not order at [facility]  Result Value Ref Range   Preg Test, Ur NEGATIVE NEGATIVE  I-Stat venous blood gas, ED  Result Value Ref Range   pH, Ven 7.410 (H) 7.250 - 7.300   pCO2, Ven 36.2 (L) 45.0 - 50.0 mmHg   pO2, Ven 33.0 30.0 - 45.0 mmHg   Bicarbonate 22.9 20.0 - 24.0 mEq/L   TCO2 24 0 - 100 mmol/L   O2 Saturation 65.0 %   Acid-base deficit 1.0 0.0 - 2.0 mmol/L   Sample type VENOUS    Comment NOTIFIED PHYSICIAN    No results found.   Imaging Review No results found.   EKG Interpretation None     ED ECG REPORT  I personally interpreted this EKG   Date: 07/14/2014   Rate:106  Rhythm: sinus tachycardia  QRS Axis: normal  Intervals: normal  ST/T Wave abnormalities: normal  Conduction Disutrbances:none  Narrative Interpretation:   Old EKG Reviewed: none available   MDM    Final diagnoses:  SOB (shortness of breath)    Patient with shortness breath, she does have some increased work of breathing, speaking in 3-4 sentences. Initial labs are reassuring. Will give fluids, check d-dimer, and will reassess.  D-dimer is negative, patient states that she is feeling much better. Labs and workup are reassuring. Patient can be discharged home. No further emergent workup indicated at this time. Recommend follow[facility]up with [facility]. Patient discussed with Dr. Anitra Lauth, who agrees with plan.    Roxy Horseman, PA-C 07/14/14 1610  Gwyneth Sprout, MD 07/15/14 [id number]

## 2014-07-14 NOTE — Discharge Instructions (Signed)

## 2014-07-15 ENCOUNTER — Emergency Department (HOSPITAL_COMMUNITY)
Admission: EM | Admit: 2014-07-15 | Discharge: 2014-07-15 | Discharge status: Against Medical Advice | Payer: Self-pay | Attending: Emergency Medicine | Admitting: Emergency Medicine

## 2014-07-15 ENCOUNTER — Encounter (HOSPITAL_COMMUNITY): Payer: Self-pay | Admitting: Emergency Medicine

## 2014-07-15 DIAGNOSIS — R1013 Epigastric pain: Secondary | ICD-10-CM | POA: Insufficient documentation

## 2014-07-15 LAB — COMPREHENSIVE METABOLIC PANEL
ALK PHOS: 79 U/L (ref 39–117)
ALT: 15 U/L (ref 0–35)
ANION GAP: 7 (ref 5–15)
AST: 18 U/L (ref 0–37)
Albumin: 4.8 g/dL (ref 3.5–5.2)
BILIRUBIN TOTAL: 0.5 mg/dL (ref 0.3–1.2)
BUN: 15 mg/dL (ref 6–23)
CHLORIDE: 104 mmol/L (ref 96–112)
CO2: 27 mmol/L (ref 19–32)
CREATININE: 0.7 mg/dL (ref 0.50–1.10)
Calcium: 9.9 mg/dL (ref 8.4–10.5)
GFR calc non Af Amer: >90 mL/min (ref >90)
GLUCOSE: 76 mg/dL (ref 70–99)
POTASSIUM: 4.2 mmol/L (ref 3.5–5.1)
Sodium: 138 mmol/L (ref 135–145)
TOTAL PROTEIN: 8.4 g/dL — AB (ref 6.0–8.3)

## 2014-07-15 LAB — LIPASE, BLOOD: LIPASE: 35 U/L (ref 11–59)

## 2014-07-15 LAB — CBC WITH DIFFERENTIAL/PLATELET
BASOS PCT: 0 % (ref 0–1)
Basophils Absolute: 0 10*3/uL (ref 0.0–0.1)
EOS PCT: 6 % — AB (ref 0–5)
Eosinophils Absolute: 0.4 10*3/uL (ref 0.0–0.7)
HCT: 42.7 % (ref 36.0–46.0)
Hemoglobin: 14.9 g/dL (ref 12.0–15.0)
LYMPHS PCT: 31 % (ref 12–46)
Lymphs Abs: 2.3 10*3/uL (ref 0.7–4.0)
MCH: 30.5 pg (ref 26.0–34.0)
MCHC: 34.9 g/dL (ref 30.0–36.0)
MCV: 87.3 fL (ref 78.0–100.0)
MONO ABS: 0.7 10*3/uL (ref 0.1–1.0)
MONOS PCT: 9 % (ref 3–12)
Neutro Abs: 4 10*3/uL (ref 1.7–7.7)
Neutrophils Relative %: 54 % (ref 43–77)
Platelets: 266 10*3/uL (ref 150–400)
RBC: 4.89 MIL/uL (ref 3.87–5.11)
RDW: 12.1 % (ref 11.5–15.5)
WBC: 7.4 10*3/uL (ref 4.0–10.5)

## 2014-07-15 LAB — TROPONIN I: Troponin I: <0.03 ng/mL (ref <0.031)

## 2014-07-15 NOTE — ED Notes (Signed)
Pt reports she was seen in ED last night. Pt states she is having epigastric pain and was told a couple of months ago she had inflamed gallbladder. Pt reports every time she eats she have heart burn.

## 2014-07-15 NOTE — ED Notes (Signed)
Pt called for room placement with no response. 

## 2014-07-17 ENCOUNTER — Emergency Department (HOSPITAL_COMMUNITY)
Admission: EM | Admit: 2014-07-17 | Discharge: 2014-07-17 | Discharge status: Against Medical Advice | Payer: Self-pay | Attending: Emergency Medicine | Admitting: Emergency Medicine

## 2014-07-17 ENCOUNTER — Encounter (HOSPITAL_COMMUNITY): Payer: Self-pay | Admitting: Emergency Medicine

## 2014-07-17 DIAGNOSIS — N390 Urinary tract infection, site not specified: Secondary | ICD-10-CM | POA: Insufficient documentation

## 2014-07-17 DIAGNOSIS — Z3202 Encounter for pregnancy test, result negative: Secondary | ICD-10-CM | POA: Insufficient documentation

## 2014-07-17 DIAGNOSIS — Z8659 Personal history of other mental and behavioral disorders: Secondary | ICD-10-CM | POA: Insufficient documentation

## 2014-07-17 DIAGNOSIS — R079 Chest pain, unspecified: Secondary | ICD-10-CM | POA: Insufficient documentation

## 2014-07-17 DIAGNOSIS — Z79899 Other long term (current) drug therapy: Secondary | ICD-10-CM | POA: Insufficient documentation

## 2014-07-17 DIAGNOSIS — Z8719 Personal history of other diseases of the digestive system: Secondary | ICD-10-CM | POA: Insufficient documentation

## 2014-07-17 DIAGNOSIS — Z791 Long term (current) use of non-steroidal anti-inflammatories (NSAID): Secondary | ICD-10-CM | POA: Insufficient documentation

## 2014-07-17 DIAGNOSIS — R0602 Shortness of breath: Secondary | ICD-10-CM | POA: Insufficient documentation

## 2014-07-17 LAB — URINALYSIS, ROUTINE W REFLEX MICROSCOPIC
Bilirubin Urine: NEGATIVE
GLUCOSE, UA: NEGATIVE mg/dL
Hgb urine dipstick: NEGATIVE
KETONES UR: NEGATIVE mg/dL
Nitrite: NEGATIVE
PROTEIN: NEGATIVE mg/dL
SPECIFIC GRAVITY, URINE: 1.021 (ref 1.005–1.030)
Urobilinogen, UA: 0.2 mg/dL (ref 0.0–1.0)
pH: 6.5 (ref 5.0–8.0)

## 2014-07-17 LAB — URINE MICROSCOPIC-ADD ON

## 2014-07-17 LAB — POC URINE PREG, ED: PREG TEST UR: NEGATIVE

## 2014-07-17 MED ORDER — CEFTRIAXONE SODIUM 1 G IJ SOLR
1.0000 g | Freq: Once | INTRAMUSCULAR | Status: AC
  Administered 2014-07-17: 1 g via INTRAMUSCULAR
  Filled 2014-07-17: qty 10

## 2014-07-17 MED ORDER — STERILE WATER FOR INJECTION IJ SOLN
INTRAMUSCULAR | Status: AC
  Filled 2014-07-17: qty 10

## 2014-07-17 MED ORDER — KETOROLAC TROMETHAMINE 60 MG/2ML IM SOLN
60.0000 mg | Freq: Once | INTRAMUSCULAR | Status: AC
  Administered 2014-07-17: 60 mg via INTRAMUSCULAR
  Filled 2014-07-17: qty 2

## 2014-07-17 MED ORDER — CEPHALEXIN 500 MG PO CAPS: 500.0000 mg | ORAL_CAPSULE | Freq: Four times a day (QID) | ORAL | Status: DC

## 2014-07-17 MED ORDER — LIDOCAINE HCL 1 % IJ SOLN
INTRAMUSCULAR | Status: AC
  Administered 2014-07-17: 1 mL
  Filled 2014-07-17: qty 20

## 2014-07-17 NOTE — ED Notes (Signed)
I made PA aware.

## 2014-07-17 NOTE — ED Provider Notes (Signed)
CSN: [id number]     Arrival date & time 07/17/14  1157 History   First MD Initiated Contact with Patient 07/17/14 1503     Chief Complaint  Patient presents with  . Abdominal Pain  . Shortness of Breath     (Consider location/radiation/quality/duration/timing/severity/associated sxs/prior Treatment) HPI Tanya Sanchez is a 24 y.o. female who comes in for evaluation of chest pain, shortness of breath, tingling in abdominal pain. Patient states earlier this morning approximately 10:30 AM she was working at [employer] one of [employer] when she began to have a sudden, sharp shooting central chest pain with associated shortness of breath and "my whole upper body was tingling". She also reports for the past 3 days she has had a sharp abdominal pain in her lower abdomen. She reports increased urinary frequency. No dysuria, hematuria, vaginal bleeding or discharge. No nausea or vomiting. Rates her discomfort now is a 3/10. PERC negative. Of note, patient seen 3 days ago in the ED for similar complaints and with negative workup. Patient states she has an appointment at 9:00 in the morning for evaluation by PCP at [facility].  Past Medical History  Diagnosis Date  . ADHD (attention deficit hyperactivity disorder)   . Gallstones    History reviewed. No pertinent past surgical history. No family history on file. History  Substance Use Topics  . Smoking status: Never Smoker   . Smokeless tobacco: Not on file  . Alcohol Use: No   OB History    No data available     Review of Systems  A 10 point review of systems was completed and was negative except for pertinent positives and negatives as mentioned in the history of present illness    Allergies  Review of patient's allergies indicates no known allergies.  Home Medications   Prior to Admission medications   Medication Sig Start Date End Date Taking? Authorizing Provider  acetaminophen (TYLENOL) 325  MG tablet Take 650 mg by mouth every 6 (six) hours as needed for headache.   Yes Historical Provider, MD  aspirin-acetaminophen-caffeine (EXCEDRIN MIGRAINE) [phone] MG per tablet Take 1 tablet by mouth every 6 (six) hours as needed for headache.   Yes Historical Provider, MD  sodium chloride (OCEAN) 0.65 % SOLN nasal spray Place 1 spray into both nostrils daily as needed for congestion.   Yes Historical Provider, MD  cephALEXin (KEFLEX) 500 MG capsule Take 1 capsule (500 mg total) by mouth 4 (four) times daily. 07/17/14   Joycie Peek, PA-C  ibuprofen (ADVIL,MOTRIN) 800 MG tablet Take 1 tablet (800 mg total) by mouth 3 (three) times daily. Patient not taking: Reported on 07/17/2014 06/08/14   Roxy Horseman, PA-C  ondansetron (ZOFRAN ODT) 4 MG disintegrating tablet Take 1 tablet (4 mg total) by mouth every 8 (eight) hours as needed for nausea or vomiting. Patient not taking: Reported on 02/24/2014 11/09/13   Francee Piccolo, PA-C  ondansetron (ZOFRAN) 4 MG tablet Take 1 tablet (4 mg total) by mouth every 8 (eight) hours as needed for nausea or vomiting. Patient not taking: Reported on 02/24/2014 11/22/13   Samuel Jester, DO  oxyCODONE-acetaminophen (PERCOCET/ROXICET) 5-325 MG per tablet Take 1 tablet by mouth every 4 (four) hours as needed for severe pain. May take 2 tablets PO q 6 hours for severe pain - Do not take with Tylenol as this tablet already contains tylenol Patient not taking: Reported on 02/24/2014 11/09/13   Victorino Dike Piepenbrink, PA-C   BP 133/70 mmHg  Pulse 61  Temp(Src) 97.9 F (36.6 C) (Oral)  Resp 20  SpO2 100%  LMP 06/06/2014 Physical Exam  Constitutional: She is oriented to person, place, and time. She appears well-developed and well-nourished.  HENT:  Head: Normocephalic and atraumatic.  Mouth/Throat: Oropharynx is clear and moist.  Eyes: Conjunctivae are normal. Pupils are equal, round, and reactive to light. Right eye exhibits no discharge. Left eye exhibits no  discharge. No scleral icterus.  Neck: Neck supple.  Cardiovascular: Normal rate, regular rhythm and normal heart sounds.   Pulmonary/Chest: Effort normal and breath sounds normal. No respiratory distress. She has no wheezes. She has no rales.  Abdominal: Soft. She exhibits no distension and no mass. There is no rebound and no guarding.  Mild Right-sided CVA tenderness. Mild tenderness in the suprapubic region. No other lesions or abnormalities noted. Negative Murphy sign, McBurney's.  Musculoskeletal: She exhibits no tenderness.  Neurological: She is alert and oriented to person, place, and time.  Cranial Nerves II-XII grossly intact  Skin: Skin is warm and dry. No rash noted.  Psychiatric: She has a normal mood and affect.  Nursing note and vitals reviewed.   ED Course  Procedures (including critical care time) Labs Review Labs Reviewed  URINALYSIS, ROUTINE W REFLEX MICROSCOPIC - Abnormal; Notable for the following:    APPearance CLOUDY (*)    Leukocytes, UA MODERATE (*)    All other components within normal limits  URINE MICROSCOPIC-ADD ON - Abnormal; Notable for the following:    Squamous Epithelial / LPF FEW (*)    Bacteria, UA FEW (*)    All other components within normal limits  URINE CULTURE  POC URINE PREG, ED  I-STAT CHEM 8, ED    Imaging Review No results found.   EKG Interpretation   Date/Time:  Tuesday July 17 2014 15:44:14 EDT Ventricular Rate:  63 PR Interval:  176 QRS Duration: 106 QT Interval:  404 QTC Calculation: 413 R Axis:   92 Text Interpretation:  Sinus arrhythmia No significant change since last  tracing Confirmed by POLLINA  MD, CHRISTOPHER [phone]([id number]) on 07/17/2014 4:01:14  PM     Meds given in ED:  Medications  sterile water (preservative free) injection (not administered)  ketorolac (TORADOL) injection 60 mg (60 mg Intramuscular Given 07/17/14 1556)  cefTRIAXone (ROCEPHIN) injection 1 g (1 g Intramuscular Given 07/17/14 1716)  lidocaine  (XYLOCAINE) 1 % (with pres) injection (1 mL  Given 07/17/14 1716)    New Prescriptions   CEPHALEXIN (KEFLEX) 500 MG CAPSULE    Take 1 capsule (500 mg total) by mouth 4 (four) times daily.   Filed Vitals:   07/17/14 1207 07/17/14 1544  BP: 118/70 133/70  Pulse: 73 61  Temp: 97.9 F (36.6 C)   TempSrc: Oral   Resp: 14 20  SpO2: 98% 100%    MDM  Vitals stable - WNL -afebrile Pt resting comfortably in ED. refuses further treatment at this time. Advised patient that she would be leaving AGAINST MEDICAL ADVICE if she refuses to allow us to collect labs. Patient verbalizes understanding and I believe that she is sound to make this decision. States she is in no discomfort and wants to go home. PE--mild suprapubic tenderness with right-sided CVA tenderness. Otherwise normal abdominal exam. Labwork--evidence of UTI on urinalysis. We will obtain urine culture.   DDX--patient with evidence of UTI, diffuse abdominal pain. Will treat empirically for pyelonephritis.  I discussed all relevant lab findings and imaging results with pt and they verbalized understanding.  Discussed f/u with PCP within 48 hrs and return precautions, pt very amenable to plan.  Final diagnoses:  UTI (lower urinary tract infection)        Joycie Peek, PA-C 07/17/14 1747  Gilda Crease, MD 07/17/14 1755

## 2014-07-17 NOTE — ED Notes (Signed)
Per EMS pt from work at [employer] and reports SOB after cleaning one of the bathrooms. Reported to a coworker that she was having difficulty breathing and increase anxiety with numbness and tingling in hands. Sats 99% on room air at the scene. Also reports "sharp" lower abdominal pain at this time. [facility]. Vitals BP 138/90 HR 100 CBG 63. Pt ambulated to restroom without difficulty.

## 2014-07-17 NOTE — Discharge Instructions (Signed)
Urinary Tract Infection Urinary tract infections (UTIs) can develop anywhere along your urinary tract. Your urinary tract is your body's drainage system for removing wastes and extra water. Your urinary tract includes two kidneys, two ureters, a bladder, and a urethra. Your kidneys are a pair of bean-shaped organs. Each kidney is about the size of your fist. They are located below your ribs, one on each side of your spine. CAUSES Infections are caused by microbes, which are microscopic organisms, including fungi, viruses, and bacteria. These organisms are so small that they can only be seen through a microscope. Bacteria are the microbes that most commonly cause UTIs. SYMPTOMS  Symptoms of UTIs may vary by age and gender of the patient and by the location of the infection. Symptoms in young women typically include a frequent and intense urge to urinate and a painful, burning feeling in the bladder or urethra during urination. Older women and men are more likely to be tired, shaky, and weak and have muscle aches and abdominal pain. A fever may mean the infection is in your kidneys. Other symptoms of a kidney infection include pain in your back or sides below the ribs, nausea, and vomiting. DIAGNOSIS To diagnose a UTI, your caregiver will ask you about your symptoms. Your caregiver also will ask to provide a urine sample. The urine sample will be tested for bacteria and white blood cells. White blood cells are made by your body to help fight infection. TREATMENT  Typically, UTIs can be treated with medication. Because most UTIs are caused by a bacterial infection, they usually can be treated with the use of antibiotics. The choice of antibiotic and length of treatment depend on your symptoms and the type of bacteria causing your infection. HOME CARE INSTRUCTIONS  If you were prescribed antibiotics, take them exactly as your caregiver instructs you. Finish the medication even if you feel better after you  have only taken some of the medication.  Drink enough water and fluids to keep your urine clear or pale yellow.  Avoid caffeine, tea, and carbonated beverages. They tend to irritate your bladder.  Empty your bladder often. Avoid holding urine for long periods of time.  Empty your bladder before and after sexual intercourse.  After a bowel movement, women should cleanse from front to back. Use each tissue only once. SEEK MEDICAL CARE IF:   You have back pain.  You develop a fever.  Your symptoms do not begin to resolve within 3 days. SEEK IMMEDIATE MEDICAL CARE IF:   You have severe back pain or lower abdominal pain.  You develop chills.  You have nausea or vomiting.  You have continued burning or discomfort with urination. MAKE SURE YOU:   Understand these instructions.  Will watch your condition.  Will get help right away if you are not doing well or get worse. Document Released: 12/17/2004 Document Revised: 09/08/2011 Document Reviewed: 04/17/2011 [facility] Patient Information 2015 [place], [place]. This information is not intended to replace advice given to you by your health care provider. Make sure you discuss any questions you have with your health care provider.  It is important for you to follow-up with primary care for further evaluation and management of your symptoms. Please take all of your antibiotics as directed. Return to ED for new or worsening symptoms.

## 2014-07-17 NOTE — ED Notes (Signed)
Patient refused to let me collect labs. 

## 2014-07-19 LAB — URINE CULTURE
Colony Count: 30000
SPECIAL REQUESTS: NORMAL

## 2014-07-30 ENCOUNTER — Encounter: Payer: Self-pay | Admitting: Family Medicine

## 2014-07-30 ENCOUNTER — Ambulatory Visit: Payer: Self-pay | Attending: Family Medicine | Admitting: Family Medicine

## 2014-07-30 VITALS — BP 106/75 | HR 69 | Temp 98.9°F | Resp 18 | Ht 63.0 in | Wt 175.0 lb

## 2014-07-30 DIAGNOSIS — J309 Allergic rhinitis, unspecified: Secondary | ICD-10-CM | POA: Insufficient documentation

## 2014-07-30 DIAGNOSIS — F410 Panic disorder [episodic paroxysmal anxiety] without agoraphobia: Secondary | ICD-10-CM | POA: Insufficient documentation

## 2014-07-30 DIAGNOSIS — K088 Other specified disorders of teeth and supporting structures: Secondary | ICD-10-CM

## 2014-07-30 DIAGNOSIS — K089 Disorder of teeth and supporting structures, unspecified: Secondary | ICD-10-CM | POA: Insufficient documentation

## 2014-07-30 DIAGNOSIS — J301 Allergic rhinitis due to pollen: Secondary | ICD-10-CM

## 2014-07-30 DIAGNOSIS — E559 Vitamin D deficiency, unspecified: Secondary | ICD-10-CM

## 2014-07-30 DIAGNOSIS — Z114 Encounter for screening for human immunodeficiency virus [HIV]: Secondary | ICD-10-CM | POA: Insufficient documentation

## 2014-07-30 LAB — TSH: TSH: 1.949 u[IU]/mL (ref 0.350–4.500)

## 2014-07-30 MED ORDER — CETIRIZINE HCL 10 MG PO TABS: 10.0000 mg | ORAL_TABLET | Freq: Every day | ORAL | Status: DC

## 2014-07-30 NOTE — Assessment & Plan Note (Signed)
A: noted on today's exam P: zyrtec daily

## 2014-07-30 NOTE — Assessment & Plan Note (Addendum)
A: 3 month of panic attacks with 3 ED visits in the last 2 months. Patient with mom as a sources of stress. Music as an outlet.  P: Vit D, TSH check  In house referral to CSW

## 2014-07-30 NOTE — Patient Instructions (Signed)
Ms. Tanya Sanchez,  Thank you for coming in today. It was a pleasure meeting you. I look forward to being your primary doctor.  1. Panic attacks:  I recommend checking in with yourself regularly, journalling may help Checking vit D, TSH today to rule out secondary causes.   2. Dental referral placed  3. Swelling in nose with history of migraine: zyrtec recommended  F/u in 6 weeks for pap smear  Dr. Armen PickupFunches

## 2014-07-30 NOTE — Assessment & Plan Note (Signed)
Dental referral placed.

## 2014-07-30 NOTE — Progress Notes (Signed)
Establish Care Panic attack, last episode 3 weeks ago

## 2014-07-30 NOTE — Progress Notes (Signed)
ASSESSMENT: Pt experiencing panic attacks, with no known cause. Pt would benefit from psychoeducation and supportive counseling to learn to cope with symptoms of panic attack; would also benefit by keeping track of panic attacks for self-awareness and continued management of symptoms. Stage of Change: contemplative  PLAN: 1. F/U with behavioral health consultant in as needed  2. Psychiatric Medications: none. 3. Behavioral recommendation(s):   -Consider reading over educational material about coping with panic attacks -Consider keeping ice pack on hand as one method to help cope with symptom of panic attack -Consider breathing techniques discussed as another method to help cope with symptom of panic attack -Consider keeping log of when and where panic attacks occur, to see if a pattern emerges  SUBJECTIVE: Pt. referred by Dr Armen PickupFunches for panic attacks:  Pt. here for referral regarding coping with symptoms of panic attacks.  Pt. reports the following symptoms/concerns: Pt states that she is having panic attacks, with no warning. She states that the one thing that seems to help her to calm down in the midst of a panic attack is cooling down via cool air.  Duration of problem: past year Severity: mild  OBJECTIVE: Orientation & Cognition: Oriented x3. Thought processes normal and appropriate to situation. Mood: appropriate. Affect: appropriate Appearance: appropriate Risk of harm to self or others: no risk of harm to self or others Substance use: none known Psychiatric medication use: Unchanged from prior contact. Assessments administered: none  Diagnosis: Panic attacks CPT Code: F41.0 -------------------------------------------- Other(s) present in the room: maternal grandmother  Time spent with patient in exam room: 16 minutes

## 2014-07-30 NOTE — Progress Notes (Signed)
   Subjective:    Patient ID: Tanya BarkerKeshia Sanchez, female    DOB: 1990-11-12, 24 y.o.   MRN: [id number] CC: establish care, panic attacks,  HPI  1. Anxiety Patient is here for evaluation of panic attacks.  She has the following anxiety symptoms: paresthesias, shortness of breath. Onset of symptoms was approximately 3 months ago.  Symptoms have been unchanged since that time. She denies current suicidal and homicidal ideation. Family history significant for depression.Possible organic causes contributing are: none. Risk factors: positive family history in  grandmother and mother Previous treatment includes none.  She admits that her mother is a source of irritability. She reports listening to music is source of stress relief.   Soc Hx: chronic non smoker  Med Hx: migraine HA  Fam Hx: depression in mom, MGM, HTN in MGM.   Review of Systems  Constitutional: Negative for fever, chills, diaphoresis, activity change, appetite change, fatigue and unexpected weight change.  HENT: Negative for dental problem.   Eyes: Negative.   Respiratory: Positive for shortness of breath.   Cardiovascular: Negative.  Negative for chest pain and palpitations.  Gastrointestinal: Negative.   Musculoskeletal: Negative.   Skin: Negative.   Allergic/Immunologic: Negative.   Neurological: Positive for numbness and headaches.  Psychiatric/Behavioral: Positive for agitation. Negative for suicidal ideas, hallucinations, behavioral problems, confusion, sleep disturbance, self-injury, dysphoric mood and decreased concentration. The patient is nervous/anxious. The patient is not hyperactive.     GAD-7: score of 8. 1-2,4,5. 2-1. 3-6.     Objective:   Physical Exam BP 106/75 mmHg  Pulse 69  Temp(Src) 98.9 F (37.2 C) (Oral)  Resp 18  Ht 5\' 3"  (1.6 m)  Wt 175 lb (79.379 kg)  BMI 31.01 kg/m2  SpO2 99%  LMP 07/10/2014 General appearance: alert, cooperative and no distress Head: Normocephalic, without obvious  abnormality, atraumatic Eyes: conjunctivae/corneas clear. PERRL, EOM's intact.  Ears: normal TM's and external ear canals both ears Nose: no discharge, turbinates pink, swollen Throat: normal findings: lips normal without lesions, palate normal and oropharynx pink & moist without lesions or evidence of thrush and abnormal findings: dentition: poor Neck: no adenopathy, supple, symmetrical, trachea midline and thyroid not enlarged, symmetric, no tenderness/mass/nodules Lungs: clear to auscultation bilaterally Heart: regular rate and rhythm, S1, S2 normal, no murmur, click, rub or gallop Extremities: extremities normal, atraumatic, no cyanosis or edema       Assessment & Plan:

## 2014-07-30 NOTE — Assessment & Plan Note (Addendum)
Screening HIV ordered  HIV neg

## 2014-07-31 DIAGNOSIS — E559 Vitamin D deficiency, unspecified: Secondary | ICD-10-CM | POA: Insufficient documentation

## 2014-07-31 LAB — HIV ANTIBODY (ROUTINE TESTING W REFLEX): HIV: NONREACTIVE

## 2014-07-31 LAB — VITAMIN D 25 HYDROXY (VIT D DEFICIENCY, FRACTURES): VIT D 25 HYDROXY: 15 ng/mL — AB (ref 30–100)

## 2014-07-31 MED ORDER — VITAMIN D (ERGOCALCIFEROL) 1.25 MG (50000 UNIT) PO CAPS: 50000.0000 [IU] | ORAL_CAPSULE | ORAL | Status: DC

## 2014-07-31 NOTE — Addendum Note (Signed)
Addended by: Dessa PhiFUNCHES, Eriana Suliman on: 07/31/2014 03:59 PM   Modules accepted: Orders

## 2014-07-31 NOTE — Assessment & Plan Note (Signed)
A: vit  D def P: vit D 50 K IU weekly x 8 weeks then 2000 IU daily

## 2014-08-01 ENCOUNTER — Telehealth: Payer: Self-pay | Admitting: *Deleted

## 2014-08-01 NOTE — Telephone Encounter (Signed)
-----   Message from Dessa PhiJosalyn Funches, MD sent at 07/31/2014  3:58 PM EDT ----- Vit D def will replace Normal TSH and screening HIV

## 2014-08-01 NOTE — Telephone Encounter (Signed)
Pt aware of results 

## 2014-08-24 ENCOUNTER — Ambulatory Visit: Payer: Self-pay | Attending: Internal Medicine

## 2014-09-12 ENCOUNTER — Ambulatory Visit: Payer: Self-pay | Attending: Family Medicine | Admitting: Family Medicine

## 2014-09-12 ENCOUNTER — Encounter: Payer: Self-pay | Admitting: Family Medicine

## 2014-09-12 VITALS — BP 105/73 | HR 82 | Temp 98.2°F | Resp 16 | Ht 63.0 in | Wt 179.0 lb

## 2014-09-12 DIAGNOSIS — Z124 Encounter for screening for malignant neoplasm of cervix: Secondary | ICD-10-CM | POA: Insufficient documentation

## 2014-09-12 DIAGNOSIS — Z23 Encounter for immunization: Secondary | ICD-10-CM

## 2014-09-12 NOTE — Progress Notes (Signed)
Annual pap  No vaginal discharge no odor

## 2014-09-12 NOTE — Assessment & Plan Note (Signed)
Screening pap done today  

## 2014-09-12 NOTE — Progress Notes (Signed)
   Subjective:    Patient ID: Tanya Sanchez, female    DOB: Jan 05, 1991, 24 y.o.   MRN: [id number] CC; pap smear   HPI  24 yo F presents for pap. No vaginal discharge or odor. Has never been sexually active. No irregular bleeding. Has not had a pap before.   Soc Hx: non smoker  Review of Systems  Constitutional: Negative for fever and chills.  Gastrointestinal: Negative for abdominal pain.  Genitourinary: Negative for vaginal bleeding, vaginal discharge, vaginal pain, menstrual problem and pelvic pain.       Objective:   Physical Exam BP 105/73 mmHg  Pulse 82  Temp(Src) 98.2 F (36.8 C) (Oral)  Resp 16  Ht 5\' 3"  (1.6 m)  Wt 179 lb (81.194 kg)  BMI 31.72 kg/m2  SpO2 98%  LMP 08/14/2014 General appearance: alert, cooperative and no distress Abdomen: soft, non-tender; bowel sounds normal; no masses,  no organomegaly Pelvic: cervix normal in appearance, external genitalia normal, no adnexal masses or tenderness, no cervical motion tenderness, rectovaginal septum normal, uterus normal size, shape, and consistency and vagina normal without discharge       Assessment & Plan:

## 2014-09-12 NOTE — Patient Instructions (Addendum)
Tanya Sanchez  Pap done today You will be called with results  F/u in 3 months for flu shot, nurse visit  F/u with me in one year, sooner if needed    Dr. Armen Pickup

## 2014-09-13 ENCOUNTER — Telehealth: Payer: Self-pay | Admitting: *Deleted

## 2014-09-13 LAB — CERVICOVAGINAL ANCILLARY ONLY
Chlamydia: NEGATIVE
Neisseria Gonorrhea: NEGATIVE
Wet Prep (BD Affirm): POSITIVE — AB

## 2014-09-13 LAB — CYTOLOGY - PAP

## 2014-09-13 NOTE — Telephone Encounter (Signed)
-----   Message from Dessa Phi, MD sent at 09/13/2014  3:37 PM EDT ----- Negative pap, repeat in 3 years

## 2014-09-13 NOTE — Telephone Encounter (Signed)
-----   Message from Dessa Phi, MD sent at 09/13/2014  3:39 PM EDT ----- BV on wet prep this is an overgrowth of normal vaginal flora, treatment not needed unless patient prefers or she develops heavy discharge with fishy odor

## 2014-09-19 NOTE — Telephone Encounter (Signed)
Pt aware of results 

## 2014-10-11 ENCOUNTER — Ambulatory Visit: Payer: Self-pay | Attending: Family Medicine | Admitting: Family Medicine

## 2014-10-11 ENCOUNTER — Encounter: Payer: Self-pay | Admitting: Family Medicine

## 2014-10-11 VITALS — BP 116/78 | HR 99 | Temp 98.5°F | Resp 16 | Ht 63.0 in | Wt 180.0 lb

## 2014-10-11 DIAGNOSIS — J301 Allergic rhinitis due to pollen: Secondary | ICD-10-CM

## 2014-10-11 DIAGNOSIS — R1013 Epigastric pain: Secondary | ICD-10-CM | POA: Insufficient documentation

## 2014-10-11 DIAGNOSIS — G44229 Chronic tension-type headache, not intractable: Secondary | ICD-10-CM

## 2014-10-11 DIAGNOSIS — E559 Vitamin D deficiency, unspecified: Secondary | ICD-10-CM

## 2014-10-11 MED ORDER — OMEPRAZOLE 20 MG PO CPDR: 20.0000 mg | DELAYED_RELEASE_CAPSULE | Freq: Every day | ORAL | Status: DC

## 2014-10-11 MED ORDER — FLUTICASONE PROPIONATE 50 MCG/ACT NA SUSP: 2.0000 | Freq: Every day | NASAL | Status: DC

## 2014-10-11 NOTE — Assessment & Plan Note (Addendum)
Headaches: Tension type related to sinus inflammation and/or poor vision  Referral to optometry placed flonase ordered Added to zyrtec

## 2014-10-11 NOTE — Assessment & Plan Note (Addendum)
A: worsening post prandial epigastric abdominal pain, known hx of gallstones. GI cocktail: did not help with pain. DDx: gastritis, biliary colic P: Add prilosec once daily 30 minutes before supper Abdominal ultrasound ordered

## 2014-10-11 NOTE — Progress Notes (Signed)
   Subjective:    Patient ID: Tanya Sanchez, female    DOB: 13-Oct-1990, 24 y.o.   MRN: [id number] CC: chronic headaches, upper abdominal pain  HPI  1. Headaches: started in 2015. Across the forehead. Sharp pain. May last most of the day, better in AM, then returns after a few hours. Moderate to severe. Currently moderate. Always some blurry vision. Some nausea or vomiting. No tingling or numbness. No family history of headaches. Working but no work stress. A lot of home stress with her mom.   2. Abdominal pain: epigastric pain. Sharp pains. A few minutes after eating. No fever or chills. At time associated with nausea and heartburn. No ETOH. Avoids fatty foods. Has known gallstones from [place] of abdomen done last year.  Soc Hx: non smoker  Review of Systems  Constitutional: Negative for fever and chills.  Respiratory: Negative for shortness of breath.   Cardiovascular: Negative for chest pain.  Gastrointestinal: Positive for abdominal pain. Negative for blood in stool.  Skin: Negative for rash.  Neurological: Positive for headaches.  Psychiatric/Behavioral: Negative for suicidal ideas and dysphoric mood.      Objective:   Physical Exam BP 116/78 mmHg  Pulse 99  Temp(Src) 98.5 F (36.9 C) (Oral)  Resp 16  Ht  (1.6 m)  Wt 180 lb (81.647 kg)  BMI 31.89 kg/m2  SpO2 98%  LMP 09/26/2014 General appearance: alert, cooperative and no distress Head: Normocephalic, without obvious abnormality, atraumatic Eyes: conjunctivae/corneas clear. PERRL, EOM's intact.  Ears: normal TM's and external ear canals both ears Nose: no discharge, turbinates pink, swollen Throat: lips, mucosa, and tongue normal; teeth and gums normal Neck: no adenopathy, no JVD and thyroid not enlarged, symmetric, no tenderness/mass/nodules Back: symmetric, no curvature. ROM normal. No CVA tenderness. Abdomen: soft, flat, no mass. TTP in epigastric area w/o rebound or guarding. Negative Murphy sign.       Assessment & Plan:

## 2014-10-11 NOTE — Patient Instructions (Signed)
Tanya Sanchez,  Thank you for coming in today  1. Headaches: Tension type related to sinus inflammation and/or poor vision  Referral to optometry placed flonase ordered  2. Abdominal pain: Add prilosec once daily 30 minutes before supper Abdominal ultrasound ordered   F/u in 2 months   Dr. Armen Pickup

## 2014-10-11 NOTE — Progress Notes (Signed)
Complaining of HA, upper abdominal pain  No hx tobacco

## 2014-10-12 LAB — COMPLETE METABOLIC PANEL WITH GFR
ALT: 8 U/L (ref 0–35)
AST: 13 U/L (ref 0–37)
Albumin: 4.2 g/dL (ref 3.5–5.2)
Alkaline Phosphatase: 64 U/L (ref 39–117)
BILIRUBIN TOTAL: 0.4 mg/dL (ref 0.2–1.2)
BUN: 12 mg/dL (ref 6–23)
CO2: 24 meq/L (ref 19–32)
Calcium: 9.6 mg/dL (ref 8.4–10.5)
Chloride: 105 mEq/L (ref 96–112)
Creat: 0.61 mg/dL (ref 0.50–1.10)
GFR, Est Non African American: >89 mL/min
GLUCOSE: 74 mg/dL (ref 70–99)
POTASSIUM: 4.6 meq/L (ref 3.5–5.3)
SODIUM: 143 meq/L (ref 135–145)
Total Protein: 7.7 g/dL (ref 6.0–8.3)

## 2014-10-12 LAB — LIPASE: Lipase: 37 U/L (ref 0–75)

## 2014-10-12 LAB — VITAMIN D 25 HYDROXY (VIT D DEFICIENCY, FRACTURES): Vit D, 25-Hydroxy: 22 ng/mL — ABNORMAL LOW (ref 30–100)

## 2014-10-14 ENCOUNTER — Encounter (HOSPITAL_COMMUNITY): Payer: Self-pay | Admitting: Nurse Practitioner

## 2014-10-14 ENCOUNTER — Inpatient Hospital Stay (HOSPITAL_COMMUNITY)
Admission: EM | Admit: 2014-10-14 | Discharge: 2014-10-16 | DRG: 419 | Disposition: A | Discharge status: Home / Self Care | Payer: Self-pay | Attending: Surgery | Admitting: Surgery

## 2014-10-14 ENCOUNTER — Emergency Department (HOSPITAL_COMMUNITY): Payer: Self-pay

## 2014-10-14 DIAGNOSIS — K8020 Calculus of gallbladder without cholecystitis without obstruction: Principal | ICD-10-CM | POA: Diagnosis present

## 2014-10-14 DIAGNOSIS — F410 Panic disorder [episodic paroxysmal anxiety] without agoraphobia: Secondary | ICD-10-CM | POA: Diagnosis present

## 2014-10-14 DIAGNOSIS — R1011 Right upper quadrant pain: Secondary | ICD-10-CM

## 2014-10-14 DIAGNOSIS — Z79899 Other long term (current) drug therapy: Secondary | ICD-10-CM

## 2014-10-14 DIAGNOSIS — Z791 Long term (current) use of non-steroidal anti-inflammatories (NSAID): Secondary | ICD-10-CM

## 2014-10-14 DIAGNOSIS — F909 Attention-deficit hyperactivity disorder, unspecified type: Secondary | ICD-10-CM | POA: Diagnosis present

## 2014-10-14 LAB — COMPREHENSIVE METABOLIC PANEL
ALT: 14 U/L (ref 14–54)
AST: 32 U/L (ref 15–41)
Albumin: 4.3 g/dL (ref 3.5–5.0)
Alkaline Phosphatase: 68 U/L (ref 38–126)
Anion gap: 8 (ref 5–15)
BUN: 10 mg/dL (ref 6–20)
CALCIUM: 9.3 mg/dL (ref 8.9–10.3)
CHLORIDE: 106 mmol/L (ref 101–111)
CO2: 23 mmol/L (ref 22–32)
Creatinine, Ser: 0.6 mg/dL (ref 0.44–1.00)
GFR calc non Af Amer: >60 mL/min (ref >60)
Glucose, Bld: 80 mg/dL (ref 65–99)
Potassium: 5.1 mmol/L (ref 3.5–5.1)
Sodium: 137 mmol/L (ref 135–145)
TOTAL PROTEIN: 7.3 g/dL (ref 6.5–8.1)
Total Bilirubin: 1.2 mg/dL (ref 0.3–1.2)

## 2014-10-14 LAB — URINALYSIS, ROUTINE W REFLEX MICROSCOPIC
Bilirubin Urine: NEGATIVE
Glucose, UA: NEGATIVE mg/dL
Hgb urine dipstick: NEGATIVE
Ketones, ur: NEGATIVE mg/dL
Nitrite: NEGATIVE
Protein, ur: NEGATIVE mg/dL
Specific Gravity, Urine: 1.026 (ref 1.005–1.030)
Urobilinogen, UA: 0.2 mg/dL (ref 0.0–1.0)
pH: 8 (ref 5.0–8.0)

## 2014-10-14 LAB — LIPASE, BLOOD: Lipase: 22 U/L (ref 22–51)

## 2014-10-14 LAB — CBC
HEMATOCRIT: 41 % (ref 36.0–46.0)
HEMOGLOBIN: 14.8 g/dL (ref 12.0–15.0)
MCH: 30.6 pg (ref 26.0–34.0)
MCHC: 36.1 g/dL — AB (ref 30.0–36.0)
MCV: 84.9 fL (ref 78.0–100.0)
Platelets: 193 10*3/uL (ref 150–400)
RBC: 4.83 MIL/uL (ref 3.87–5.11)
RDW: 12 % (ref 11.5–15.5)
WBC: 7.6 10*3/uL (ref 4.0–10.5)

## 2014-10-14 LAB — POC URINE PREG, ED: PREG TEST UR: NEGATIVE

## 2014-10-14 LAB — URINE MICROSCOPIC-ADD ON

## 2014-10-14 MED ORDER — FLUTICASONE PROPIONATE 50 MCG/ACT NA SUSP
2.0000 | Freq: Every day | NASAL | Status: DC
  Administered 2014-10-15 – 2014-10-16 (×2): 2 via NASAL
  Filled 2014-10-14: qty 16

## 2014-10-14 MED ORDER — ONDANSETRON HCL 4 MG/2ML IJ SOLN
4.0000 mg | Freq: Once | INTRAMUSCULAR | Status: AC
  Administered 2014-10-14: 4 mg via INTRAVENOUS
  Filled 2014-10-14: qty 2

## 2014-10-14 MED ORDER — POTASSIUM CHLORIDE IN NACL 20-0.9 MEQ/L-% IV SOLN
INTRAVENOUS | Status: DC
  Administered 2014-10-14 – 2014-10-16 (×4): via INTRAVENOUS
  Filled 2014-10-14 (×4): qty 1000

## 2014-10-14 MED ORDER — HYDROMORPHONE HCL 1 MG/ML IJ SOLN
1.0000 mg | Freq: Once | INTRAMUSCULAR | Status: AC
  Administered 2014-10-14: 1 mg via INTRAVENOUS
  Filled 2014-10-14: qty 1

## 2014-10-14 MED ORDER — FENTANYL CITRATE (PF) 100 MCG/2ML IJ SOLN
100.0000 ug | Freq: Once | INTRAMUSCULAR | Status: AC
  Administered 2014-10-14: 100 ug via INTRAVENOUS
  Filled 2014-10-14: qty 2

## 2014-10-14 MED ORDER — CEFTRIAXONE SODIUM IN DEXTROSE 40 MG/ML IV SOLN
2.0000 g | INTRAVENOUS | Status: DC
  Administered 2014-10-14 – 2014-10-15 (×2): 2 g via INTRAVENOUS
  Filled 2014-10-14 (×3): qty 50

## 2014-10-14 MED ORDER — OXYCODONE-ACETAMINOPHEN 5-325 MG PO TABS
2.0000 | ORAL_TABLET | Freq: Once | ORAL | Status: DC
  Filled 2014-10-14: qty 2

## 2014-10-14 MED ORDER — ENOXAPARIN SODIUM 40 MG/0.4ML ~~LOC~~ SOLN
40.0000 mg | SUBCUTANEOUS | Status: DC
  Administered 2014-10-16: 40 mg via SUBCUTANEOUS
  Filled 2014-10-14: qty 0.4

## 2014-10-14 MED ORDER — HYDROMORPHONE HCL 1 MG/ML IJ SOLN: 1.0000 mg | INTRAMUSCULAR | Status: DC | PRN

## 2014-10-14 MED ORDER — ONDANSETRON HCL 4 MG/2ML IJ SOLN
4.0000 mg | Freq: Four times a day (QID) | INTRAMUSCULAR | Status: DC | PRN
  Administered 2014-10-15: 4 mg via INTRAVENOUS

## 2014-10-14 MED ORDER — PANTOPRAZOLE SODIUM 40 MG IV SOLR
40.0000 mg | Freq: Every day | INTRAVENOUS | Status: DC
  Administered 2014-10-14 – 2014-10-15 (×2): 40 mg via INTRAVENOUS
  Filled 2014-10-14 (×2): qty 40

## 2014-10-14 NOTE — ED Notes (Signed)
She c/o upper abd pain, nausea, poor appetite for past few days. She is concerned because she has a history of gallbladder disease. She tried GI cocktail, otc pain meds with no relief. She is A&Ox4, resp e/u

## 2014-10-14 NOTE — ED Provider Notes (Signed)
CSN: [id number]     Arrival date & time 10/14/14  1246 History   First MD Initiated Contact with Patient 10/14/14 1302     Chief Complaint  Patient presents with  . Abdominal Pain     (Consider location/radiation/quality/duration/timing/severity/associated sxs/prior Treatment) HPI Comments: Patient with history of cholelithiasis presents with complaint of upper abdominal pain in the epigastrium with nausea and decreased appetite for the past 2 days. Pain does not radiate. It is not associated with fever, vomiting, diarrhea or constipation. No previous abdominal surgeries. Patient states that her pain feels similar to her previous gallbladder pain, however this time the pain is not going away. It is worse with any food. No treatments prior to arrival. Patient states that she has an ultrasound scheduled for 4 days from now. The onset of this condition was acute. The course is constant. Aggravating factors: eating, palpation. Alleviating factors: none.    Patient is a 24 y.o. female presenting with abdominal pain. The history is provided by the patient.  Abdominal Pain Associated symptoms: nausea   Associated symptoms: no chest pain, no chills, no constipation, no cough, no diarrhea, no dysuria, no fever, no sore throat and no vomiting     Past Medical History  Diagnosis Date  . ADHD (attention deficit hyperactivity disorder)   . Gallstones   . Panic attack 2016   History reviewed. No pertinent past surgical history. Family History  Problem Relation Age of Onset  . Heart disease Maternal Grandmother   . Hypertension Maternal Grandmother   . Depression Maternal Grandmother   . Arthritis Maternal Grandmother   . Depression Mother   . Cancer Mother   . Depression Sister   . Bladder Cancer Mother    History  Substance Use Topics  . Smoking status: Never Smoker   . Smokeless tobacco: Never Used  . Alcohol Use: No   OB History    No data available     Review of Systems   Constitutional: Positive for appetite change. Negative for fever and chills.  HENT: Negative for rhinorrhea and sore throat.   Eyes: Negative for redness.  Respiratory: Negative for cough.   Cardiovascular: Negative for chest pain.  Gastrointestinal: Positive for nausea and abdominal pain. Negative for vomiting, diarrhea, constipation and blood in stool.  Genitourinary: Negative for dysuria.  Musculoskeletal: Negative for myalgias.  Skin: Negative for rash.  Neurological: Negative for headaches.    Allergies  Review of patient's allergies indicates no known allergies.  Home Medications   Prior to Admission medications   Medication Sig Start Date End Date Taking? Authorizing Provider  acetaminophen (TYLENOL) 325 MG tablet Take 650 mg by mouth every 6 (six) hours as needed for headache.   Yes Historical Provider, MD  cetirizine (ZYRTEC) 10 MG tablet Take 1 tablet (10 mg total) by mouth daily. 07/30/14  Yes Josalyn Funches, MD  Multiple Vitamins-Calcium (ONE-A-DAY WOMENS FORMULA PO) Take 1 tablet by mouth daily.   Yes Historical Provider, MD  fluticasone (FLONASE) 50 MCG/ACT nasal spray Place 2 sprays into both nostrils daily. 10/11/14   Josalyn Funches, MD  omeprazole (PRILOSEC) 20 MG capsule Take 1 capsule (20 mg total) by mouth daily. 10/11/14   Josalyn Funches, MD   BP 126/91 mmHg  Pulse 87  Temp(Src) 98.3 F (36.8 C) (Oral)  Resp 16  Ht  (1.6 m)  Wt 178 lb 6.4 oz (80.922 kg)  BMI 31.61 kg/m2  SpO2 98%  LMP 09/21/2014   Physical Exam  Constitutional: She  appears well-developed and well-nourished.  HENT:  Head: Normocephalic and atraumatic.  Eyes: Conjunctivae are normal. Right eye exhibits no discharge. Left eye exhibits no discharge.  Neck: Normal range of motion. Neck supple.  Cardiovascular: Normal rate, regular rhythm and normal heart sounds.   Pulmonary/Chest: Effort normal and breath sounds normal.  Abdominal: Soft. There is tenderness in the right upper quadrant  and epigastric area. There is no rigidity, no rebound, no guarding, no CVA tenderness, no tenderness at McBurney's point and negative Murphy's sign.  Neurological: She is alert.  Skin: Skin is warm and dry.  Psychiatric: She has a normal mood and affect.  Nursing note and vitals reviewed.   ED Course  Procedures (including critical care time) Labs Review Labs Reviewed  CBC - Abnormal; Notable for the following:    MCHC 36.1 (*)    All other components within normal limits  URINALYSIS, ROUTINE W REFLEX MICROSCOPIC (NOT AT [facility]) - Abnormal; Notable for the following:    APPearance CLOUDY (*)    Leukocytes, UA LARGE (*)    All other components within normal limits  URINE MICROSCOPIC-ADD ON - Abnormal; Notable for the following:    Squamous Epithelial / LPF FEW (*)    All other components within normal limits  LIPASE, BLOOD  COMPREHENSIVE METABOLIC PANEL  POC URINE PREG, ED    Imaging Review US Abdomen Complete  10/14/2014   CLINICAL DATA:  Right upper quadrant pain.  EXAM: ULTRASOUND ABDOMEN COMPLETE  COMPARISON:  None.  FINDINGS: Gallbladder: Cholelithiasis. No gallbladder wall thickening or pericholecystic fluid. Negative sonographic Murphy sign.  Common bile duct: Diameter: 3 mm  Liver: No focal lesion identified. Within normal limits in parenchymal echogenicity.  IVC: No abnormality visualized.  Pancreas: Visualized portion unremarkable.  Spleen: Size and appearance within normal limits.  Right Kidney: Length: 10 cm. Echogenicity within normal limits. No mass or hydronephrosis visualized.  Left Kidney: Length: 10.4 cm. Echogenicity within normal limits. No mass or hydronephrosis visualized.  Abdominal aorta: No aneurysm visualized.  Other findings: None.  IMPRESSION: 1. Cholelithiasis without sonographic evidence of acute cholecystitis.   Electronically Signed   By: Elige Ko   On: 10/14/2014 15:10     EKG Interpretation None       1:55 PM Patient seen and examined. Work-up  initiated. Medications ordered.   Vital signs reviewed and are as follows: BP 126/91 mmHg  Pulse 87  Temp(Src) 98.3 F (36.8 C) (Oral)  Resp 16  Ht 5\' 3"  (1.6 m)  Wt 178 lb 6.4 oz (80.922 kg)  BMI 31.61 kg/m2  SpO2 98%  LMP 09/21/2014  4:52 PM Patient updated on results. Fentanyl ordered.   Symptoms are not yet adequately controlled.   Handoff to [employer]. Plan: re-eval. Consider surgery consult if pain/vomiting uncontrolled.     MDM   Final diagnoses:  RUQ abdominal pain   Pending symptoms controlled.    Renne Crigler, PA-C 10/14/14 1653  Richardean Canal, MD 10/15/14 [phone]

## 2014-10-14 NOTE — ED Provider Notes (Signed)
Received signout end of shift. Patient presents with right upper quadrant and epigastric tenderness for the past several days worsening with eating. No leukocytosis and normal electrolyte panel. Abdominal ultrasound showing evidence of cholelithiasis without evidence of cholecystitis. However, patient has received a total of 2 mg of Dilaudid, of fentanyl, and oxycodone without adequate relief. She continues to endorse pain along with nausea and vomiting. At this time, I will consult with general surgery for further evaluation and treatment.  5:53 PM Appreciate consultation from General Surgery Dr. Corliss Skains who agrees to see pt in ER and will admit for further care.     Physical Exam  BP 111/69 mmHg  Pulse 91  Temp(Src) 98.3 F (36.8 C) (Oral)  Resp 16  Ht 5\' 3"  (1.6 m)  Wt 178 lb 6.4 oz (80.922 kg)  BMI 31.61 kg/m2  SpO2 100%  LMP 09/21/2014  Physical Exam  Constitutional: She appears well-developed and well-nourished. No distress.  HENT:  Head: Atraumatic.  Eyes: Conjunctivae are normal.  Neck: Neck supple.  Abdominal: Soft. There is tenderness (Epigastric tenderness to palpation without guarding or rebound tenderness.).  Neurological: She is alert.  Skin: No rash noted.  Psychiatric: She has a normal mood and affect.  Nursing note and vitals reviewed.  Results for orders placed or performed during the hospital encounter of 10/14/14  Lipase, blood  Result Value Ref Range   Lipase 22 22 - 51 U/L  Comprehensive metabolic panel  Result Value Ref Range   Sodium 137 135 - 145 mmol/L   Potassium 5.1 3.5 - 5.1 mmol/L   Chloride 106 101 - 111 mmol/L   CO2 23 22 - 32 mmol/L   Glucose, Bld 80 65 - 99 mg/dL   BUN 10 6 - 20 mg/dL   Creatinine, Ser 7.82 0.44 - 1.00 mg/dL   Calcium 9.3 8.9 - 95.6 mg/dL   Total Protein 7.3 6.5 - 8.1 g/dL   Albumin 4.3 3.5 - 5.0 g/dL   AST 32 15 - 41 U/L   ALT 14 14 - 54 U/L   Alkaline Phosphatase 68 38 - 126 U/L   Total Bilirubin 1.2 0.3 - 1.2  mg/dL   GFR calc non Af Amer >60 >60 mL/min   GFR calc Af Amer >60 >60 mL/min   Anion gap 8 5 - 15  CBC  Result Value Ref Range   WBC 7.6 4.0 - 10.5 K/uL   RBC 4.83 3.87 - 5.11 MIL/uL   Hemoglobin 14.8 12.0 - 15.0 g/dL   HCT 21.3 08.6 - 57.8 %   MCV 84.9 78.0 - 100.0 fL   MCH 30.6 26.0 - 34.0 pg   MCHC 36.1 (H) 30.0 - 36.0 g/dL   RDW 46.9 62.9 - 52.8 %   Platelets 193 150 - 400 K/uL  Urinalysis, Routine w reflex microscopic (not at [facility])  Result Value Ref Range   Color, Urine YELLOW YELLOW   APPearance CLOUDY (A) CLEAR   Specific Gravity, Urine 1.026 1.005 - 1.030   pH 8.0 5.0 - 8.0   Glucose, UA NEGATIVE NEGATIVE mg/dL   Hgb urine dipstick NEGATIVE NEGATIVE   Bilirubin Urine NEGATIVE NEGATIVE   Ketones, ur NEGATIVE NEGATIVE mg/dL   Protein, ur NEGATIVE NEGATIVE mg/dL   Urobilinogen, UA 0.2 0.0 - 1.0 mg/dL   Nitrite NEGATIVE NEGATIVE   Leukocytes, UA LARGE (A) NEGATIVE  Urine microscopic-add on  Result Value Ref Range   Squamous Epithelial / LPF FEW (A) RARE   WBC, UA 7-10 <  3 WBC/hpf   Bacteria, UA RARE RARE  POC urine preg, ED (not at [facility])  Result Value Ref Range   Preg Test, Ur NEGATIVE NEGATIVE   US Abdomen Complete  10/14/2014   CLINICAL DATA:  Right upper quadrant pain.  EXAM: ULTRASOUND ABDOMEN COMPLETE  COMPARISON:  None.  FINDINGS: Gallbladder: Cholelithiasis. No gallbladder wall thickening or pericholecystic fluid. Negative sonographic Murphy sign.  Common bile duct: Diameter: 3 mm  Liver: No focal lesion identified. Within normal limits in parenchymal echogenicity.  IVC: No abnormality visualized.  Pancreas: Visualized portion unremarkable.  Spleen: Size and appearance within normal limits.  Right Kidney: Length: 10 cm. Echogenicity within normal limits. No mass or hydronephrosis visualized.  Left Kidney: Length: 10.4 cm. Echogenicity within normal limits. No mass or hydronephrosis visualized.  Abdominal aorta: No aneurysm visualized.  Other findings: None.   IMPRESSION: 1. Cholelithiasis without sonographic evidence of acute cholecystitis.   Electronically Signed   By: Elige Ko   On: 10/14/2014 15:10      ED Course  Procedures        Fayrene Helper, PA-C 10/14/14 1928  Pricilla Loveless, MD 10/17/14 [phone]

## 2014-10-14 NOTE — H&P (Signed)
Tanya Sanchez is an 24 y.o. female.   Chief Complaint: Abdominal pain, N/V HPI: This is a 23 yo female with a documented history of cholelithiasis who never saw surgical care because of financial reasons. She presents with several days of worsening epigastric abdominal pain that was intermittent and worsened by eating. This was associated with some nausea and vomiting. Her symptoms of worsening to the point that the pain is constant. Even when she tries to drink water she gets nausea and vomiting. The pain persists in her epigastrium. She presented to the emergency department for evaluation. White count was normal. Ultrasound showed cholelithiasis but no sign of cholecystitis. However the patient's symptoms are poorly managed with IV pain medication.   Past Medical History  Diagnosis Date  . ADHD (attention deficit hyperactivity disorder)   . Gallstones   . Panic attack 2016    History reviewed. No pertinent past surgical history.  Family History  Problem Relation Age of Onset  . Heart disease Maternal Grandmother   . Hypertension Maternal Grandmother   . Depression Maternal Grandmother   . Arthritis Maternal Grandmother   . Depression Mother   . Cancer Mother   . Depression Sister   . Bladder Cancer Mother    Social History:  reports that she has never smoked. She has never used smokeless tobacco. She reports that she does not drink alcohol or use illicit drugs.  Allergies: No Known Allergies  Prior to Admission medications   Medication Sig Start Date End Date Taking? Authorizing Provider  acetaminophen (TYLENOL) 325 MG tablet Take 650 mg by mouth every 6 (six) hours as needed for headache.   Yes Historical Provider, MD  cetirizine (ZYRTEC) 10 MG tablet Take 1 tablet (10 mg total) by mouth daily. 07/30/14  Yes Josalyn Funches, MD  Multiple Vitamins-Calcium (ONE-A-DAY WOMENS FORMULA PO) Take 1 tablet by mouth daily.   Yes Historical Provider, MD  fluticasone (FLONASE) 50 MCG/ACT  nasal spray Place 2 sprays into both nostrils daily. 10/11/14   Josalyn Funches, MD  omeprazole (PRILOSEC) 20 MG capsule Take 1 capsule (20 mg total) by mouth daily. 10/11/14   Boykin Nearing, MD     Results for orders placed or performed during the hospital encounter of 10/14/14 (from the past 48 hour(s))  Urinalysis, Routine w reflex microscopic (not at [facility])     Status: Abnormal   Collection Time: 10/14/14  1:00 PM  Result Value Ref Range   Color, Urine YELLOW YELLOW   APPearance CLOUDY (A) CLEAR   Specific Gravity, Urine 1.026 1.005 - 1.030   pH 8.0 5.0 - 8.0   Glucose, UA NEGATIVE NEGATIVE mg/dL   Hgb urine dipstick NEGATIVE NEGATIVE   Bilirubin Urine NEGATIVE NEGATIVE   Ketones, ur NEGATIVE NEGATIVE mg/dL   Protein, ur NEGATIVE NEGATIVE mg/dL   Urobilinogen, UA 0.2 0.0 - 1.0 mg/dL   Nitrite NEGATIVE NEGATIVE   Leukocytes, UA LARGE (A) NEGATIVE  Urine microscopic-add on     Status: Abnormal   Collection Time: 10/14/14  1:00 PM  Result Value Ref Range   Squamous Epithelial / LPF FEW (A) RARE   WBC, UA 7-10 <3 WBC/hpf   Bacteria, UA RARE RARE  POC urine preg, ED (not at [facility])     Status: None   Collection Time: 10/14/14  1:17 PM  Result Value Ref Range   Preg Test, Ur NEGATIVE NEGATIVE    Comment:        THE SENSITIVITY OF THIS METHODOLOGY IS >24 mIU/mL  Lipase, blood     Status: None   Collection Time: 10/14/14  1:45 PM  Result Value Ref Range   Lipase 22 22 - 51 U/L  Comprehensive metabolic panel     Status: None   Collection Time: 10/14/14  1:45 PM  Result Value Ref Range   Sodium 137 135 - 145 mmol/L   Potassium 5.1 3.5 - 5.1 mmol/L   Chloride 106 101 - 111 mmol/L   CO2 23 22 - 32 mmol/L   Glucose, Bld 80 65 - 99 mg/dL   BUN 10 6 - 20 mg/dL   Creatinine, Ser 0.60 0.44 - 1.00 mg/dL   Calcium 9.3 8.9 - 10.3 mg/dL   Total Protein 7.3 6.5 - 8.1 g/dL   Albumin 4.3 3.5 - 5.0 g/dL   AST 32 15 - 41 U/L   ALT 14 14 - 54 U/L   Alkaline Phosphatase 68 38 - 126 U/L    Total Bilirubin 1.2 0.3 - 1.2 mg/dL   GFR calc non Af Amer >60 >60 mL/min   GFR calc Af Amer >60 >60 mL/min    Comment: (NOTE) The eGFR has been calculated using the CKD EPI equation. This calculation has not been validated in all clinical situations. eGFR's persistently <60 mL/min signify possible Chronic Kidney Disease.    Anion gap 8 5 - 15  CBC     Status: Abnormal   Collection Time: 10/14/14  1:45 PM  Result Value Ref Range   WBC 7.6 4.0 - 10.5 K/uL   RBC 4.83 3.87 - 5.11 MIL/uL   Hemoglobin 14.8 12.0 - 15.0 g/dL   HCT 41.0 36.0 - 46.0 %   MCV 84.9 78.0 - 100.0 fL   MCH 30.6 26.0 - 34.0 pg   MCHC 36.1 (H) 30.0 - 36.0 g/dL   RDW 12.0 11.5 - 15.5 %   Platelets 193 150 - 400 K/uL   US Abdomen Complete  10/14/2014   CLINICAL DATA:  Right upper quadrant pain.  EXAM: ULTRASOUND ABDOMEN COMPLETE  COMPARISON:  None.  FINDINGS: Gallbladder: Cholelithiasis. No gallbladder wall thickening or pericholecystic fluid. Negative sonographic Murphy sign.  Common bile duct: Diameter: 3 mm  Liver: No focal lesion identified. Within normal limits in parenchymal echogenicity.  IVC: No abnormality visualized.  Pancreas: Visualized portion unremarkable.  Spleen: Size and appearance within normal limits.  Right Kidney: Length: 10 cm. Echogenicity within normal limits. No mass or hydronephrosis visualized.  Left Kidney: Length: 10.4 cm. Echogenicity within normal limits. No mass or hydronephrosis visualized.  Abdominal aorta: No aneurysm visualized.  Other findings: None.  IMPRESSION: 1. Cholelithiasis without sonographic evidence of acute cholecystitis.   Electronically Signed   By: Kathreen Devoid   On: 10/14/2014 15:10    Review of Systems  Constitutional: Negative for weight loss.  HENT: Negative for ear discharge, ear pain, hearing loss and tinnitus.   Eyes: Negative for blurred vision, double vision, photophobia and pain.  Respiratory: Negative for cough, sputum production and shortness of breath.    Cardiovascular: Negative for chest pain.  Gastrointestinal: Positive for nausea, vomiting and abdominal pain.  Genitourinary: Negative for dysuria, urgency, frequency and flank pain.  Musculoskeletal: Negative for myalgias, back pain, joint pain, falls and neck pain.  Neurological: Negative for dizziness, tingling, sensory change, focal weakness, loss of consciousness and headaches.  Endo/Heme/Allergies: Does not bruise/bleed easily.  Psychiatric/Behavioral: Negative for depression, memory loss and substance abuse. The patient is not nervous/anxious.     Blood pressure 117/64, pulse 76,  temperature 98.3 F (36.8 C), temperature source Oral, resp. rate 16, height 5' 3"  (1.6 m), weight 80.922 kg (178 lb 6.4 oz), last menstrual period 09/21/2014, SpO2 98 %. Physical Exam  WDWN in NAD HEENT:  EOMI, sclera anicteric Neck:  No masses, no thyromegaly Lungs:  CTA bilaterally; normal respiratory effort CV:  Regular rate and rhythm; no murmurs Abd:  +bowel sounds, soft, tender in epigastrium; no palpable masses; no rebound Ext:  Well-perfused; no edema Skin:  Warm, dry; no sign of jaundice  Assessment/Plan 1.  Cholelithiasis 2.  Refractory epigastric discomfort - likely from symptomatic cholelithiasis, but could also be peptic ulcer disease.  Admit overnight for pain control.  Protonix.  Recheck labs in AM Possible laparoscopic cholecystectomy with intraoperative cholangiogram tomorrow.   Jaeleen Inzunza K. 10/14/2014, 8:09 PM

## 2014-10-15 ENCOUNTER — Inpatient Hospital Stay (HOSPITAL_COMMUNITY): Payer: MEDICAID | Admitting: Anesthesiology

## 2014-10-15 ENCOUNTER — Inpatient Hospital Stay (HOSPITAL_COMMUNITY): Payer: Self-pay | Admitting: Anesthesiology

## 2014-10-15 ENCOUNTER — Encounter (HOSPITAL_COMMUNITY): Admission: EM | Disposition: A | Discharge status: Home / Self Care | Payer: Self-pay | Source: Home / Self Care

## 2014-10-15 ENCOUNTER — Encounter (HOSPITAL_COMMUNITY): Payer: Self-pay | Admitting: General Practice

## 2014-10-15 HISTORY — PX: CHOLECYSTECTOMY: SHX55

## 2014-10-15 LAB — COMPREHENSIVE METABOLIC PANEL
ALBUMIN: 3.9 g/dL (ref 3.5–5.0)
ALT: 11 U/L — ABNORMAL LOW (ref 14–54)
AST: 15 U/L (ref 15–41)
Alkaline Phosphatase: 66 U/L (ref 38–126)
Anion gap: 6 (ref 5–15)
BILIRUBIN TOTAL: 0.6 mg/dL (ref 0.3–1.2)
BUN: 10 mg/dL (ref 6–20)
CHLORIDE: 106 mmol/L (ref 101–111)
CO2: 24 mmol/L (ref 22–32)
CREATININE: 0.69 mg/dL (ref 0.44–1.00)
Calcium: 8.9 mg/dL (ref 8.9–10.3)
GFR calc Af Amer: >60 mL/min (ref >60)
GFR calc non Af Amer: >60 mL/min (ref >60)
GLUCOSE: 82 mg/dL (ref 65–99)
POTASSIUM: 4.4 mmol/L (ref 3.5–5.1)
Sodium: 136 mmol/L (ref 135–145)
TOTAL PROTEIN: 7.1 g/dL (ref 6.5–8.1)

## 2014-10-15 LAB — CBC
HCT: 39.6 % (ref 36.0–46.0)
Hemoglobin: 13.9 g/dL (ref 12.0–15.0)
MCH: 30.3 pg (ref 26.0–34.0)
MCHC: 35.1 g/dL (ref 30.0–36.0)
MCV: 86.3 fL (ref 78.0–100.0)
Platelets: 227 10*3/uL (ref 150–400)
RBC: 4.59 MIL/uL (ref 3.87–5.11)
RDW: 12.2 % (ref 11.5–15.5)
WBC: 8.8 10*3/uL (ref 4.0–10.5)

## 2014-10-15 SURGERY — LAPAROSCOPIC CHOLECYSTECTOMY WITH INTRAOPERATIVE CHOLANGIOGRAM
Anesthesia: General | Site: Abdomen

## 2014-10-15 MED ORDER — BUPIVACAINE-EPINEPHRINE 0.25% -1:200000 IJ SOLN
INTRAMUSCULAR | Status: DC | PRN
  Administered 2014-10-15: 10 mL

## 2014-10-15 MED ORDER — LIDOCAINE HCL (CARDIAC) 20 MG/ML IV SOLN
INTRAVENOUS | Status: DC | PRN
  Administered 2014-10-15: 50 mg via INTRAVENOUS

## 2014-10-15 MED ORDER — HYDROMORPHONE HCL 1 MG/ML IJ SOLN
0.2500 mg | INTRAMUSCULAR | Status: DC | PRN
  Administered 2014-10-15 (×4): 0.5 mg via INTRAVENOUS

## 2014-10-15 MED ORDER — MIDAZOLAM HCL 5 MG/5ML IJ SOLN
INTRAMUSCULAR | Status: DC | PRN
  Administered 2014-10-15: 2 mg via INTRAVENOUS

## 2014-10-15 MED ORDER — MIDAZOLAM HCL 2 MG/2ML IJ SOLN
INTRAMUSCULAR | Status: AC
  Filled 2014-10-15: qty 2

## 2014-10-15 MED ORDER — LACTATED RINGERS IV SOLN
INTRAVENOUS | Status: DC
  Administered 2014-10-15 (×3): via INTRAVENOUS

## 2014-10-15 MED ORDER — MEPERIDINE HCL 25 MG/ML IJ SOLN: 6.2500 mg | INTRAMUSCULAR | Status: DC | PRN

## 2014-10-15 MED ORDER — FENTANYL CITRATE (PF) 100 MCG/2ML IJ SOLN
INTRAMUSCULAR | Status: DC | PRN
  Administered 2014-10-15: 100 ug via INTRAVENOUS
  Administered 2014-10-15: 150 ug via INTRAVENOUS

## 2014-10-15 MED ORDER — ROCURONIUM BROMIDE 100 MG/10ML IV SOLN
INTRAVENOUS | Status: DC | PRN
  Administered 2014-10-15: 35 mg via INTRAVENOUS

## 2014-10-15 MED ORDER — 0.9 % SODIUM CHLORIDE (POUR BTL) OPTIME
TOPICAL | Status: DC | PRN
  Administered 2014-10-15: 1000 mL

## 2014-10-15 MED ORDER — LIDOCAINE HCL (CARDIAC) 20 MG/ML IV SOLN
INTRAVENOUS | Status: AC
  Filled 2014-10-15: qty 5

## 2014-10-15 MED ORDER — ONDANSETRON HCL 4 MG/2ML IJ SOLN
INTRAMUSCULAR | Status: AC
  Filled 2014-10-15: qty 2

## 2014-10-15 MED ORDER — ROCURONIUM BROMIDE 50 MG/5ML IV SOLN
INTRAVENOUS | Status: AC
  Filled 2014-10-15: qty 1

## 2014-10-15 MED ORDER — HYDROMORPHONE HCL 1 MG/ML IJ SOLN
INTRAMUSCULAR | Status: AC
  Filled 2014-10-15: qty 1

## 2014-10-15 MED ORDER — NEOSTIGMINE METHYLSULFATE 10 MG/10ML IV SOLN
INTRAVENOUS | Status: DC | PRN
  Administered 2014-10-15: 3 mg via INTRAVENOUS

## 2014-10-15 MED ORDER — PROPOFOL 10 MG/ML IV BOLUS
INTRAVENOUS | Status: AC
  Filled 2014-10-15: qty 20

## 2014-10-15 MED ORDER — FENTANYL CITRATE (PF) 250 MCG/5ML IJ SOLN
INTRAMUSCULAR | Status: AC
  Filled 2014-10-15: qty 5

## 2014-10-15 MED ORDER — SUCCINYLCHOLINE CHLORIDE 20 MG/ML IJ SOLN
INTRAMUSCULAR | Status: DC | PRN
  Administered 2014-10-15: 100 mg via INTRAVENOUS

## 2014-10-15 MED ORDER — DEXAMETHASONE SODIUM PHOSPHATE 4 MG/ML IJ SOLN
INTRAMUSCULAR | Status: AC
  Filled 2014-10-15: qty 2

## 2014-10-15 MED ORDER — PROPOFOL 10 MG/ML IV BOLUS
INTRAVENOUS | Status: DC | PRN
  Administered 2014-10-15: 200 mg via INTRAVENOUS

## 2014-10-15 MED ORDER — PROMETHAZINE HCL 25 MG/ML IJ SOLN
6.2500 mg | INTRAMUSCULAR | Status: AC | PRN
Start: 2014-10-15 — End: 2014-10-15
  Administered 2014-10-15 (×2): 6.25 mg via INTRAVENOUS

## 2014-10-15 MED ORDER — BUPIVACAINE-EPINEPHRINE (PF) 0.25% -1:200000 IJ SOLN
INTRAMUSCULAR | Status: AC
  Filled 2014-10-15: qty 30

## 2014-10-15 MED ORDER — SODIUM CHLORIDE 0.9 % IR SOLN
Status: DC | PRN
  Administered 2014-10-15: 2000 mL

## 2014-10-15 MED ORDER — OXYCODONE-ACETAMINOPHEN 5-325 MG PO TABS: 2.0000 | ORAL_TABLET | Freq: Once | ORAL | Status: DC

## 2014-10-15 MED ORDER — PROMETHAZINE HCL 25 MG/ML IJ SOLN
INTRAMUSCULAR | Status: AC
  Filled 2014-10-15: qty 1

## 2014-10-15 MED ORDER — GLYCOPYRROLATE 0.2 MG/ML IJ SOLN
INTRAMUSCULAR | Status: DC | PRN
  Administered 2014-10-15: 0.4 mg via INTRAVENOUS

## 2014-10-15 MED ORDER — HYDROMORPHONE HCL 1 MG/ML IJ SOLN
1.0000 mg | INTRAMUSCULAR | Status: DC | PRN
  Administered 2014-10-15: 1 mg via INTRAVENOUS
  Filled 2014-10-15 (×2): qty 1

## 2014-10-15 MED ORDER — OXYCODONE-ACETAMINOPHEN 5-325 MG PO TABS
1.0000 | ORAL_TABLET | ORAL | Status: DC | PRN
  Administered 2014-10-16 (×2): 2 via ORAL
  Filled 2014-10-15 (×3): qty 2

## 2014-10-15 MED ORDER — DEXAMETHASONE SODIUM PHOSPHATE 4 MG/ML IJ SOLN
INTRAMUSCULAR | Status: DC | PRN
  Administered 2014-10-15: 8 mg via INTRAVENOUS

## 2014-10-15 MED ORDER — MIDAZOLAM HCL 2 MG/2ML IJ SOLN
0.5000 mg | Freq: Once | INTRAMUSCULAR | Status: AC | PRN
Start: 2014-10-15 — End: 2014-10-15
  Administered 2014-10-15: 1 mg via INTRAVENOUS

## 2014-10-15 SURGICAL SUPPLY — 35 items
APPLIER CLIP 5 13 M/L LIGAMAX5 (MISCELLANEOUS) ×3
BLADE SURG ROTATE 9660 (MISCELLANEOUS) IMPLANT
CANISTER SUCTION 2500CC (MISCELLANEOUS) ×3 IMPLANT
CHLORAPREP W/TINT 26ML (MISCELLANEOUS) ×3 IMPLANT
CLIP APPLIE 5 13 M/L LIGAMAX5 (MISCELLANEOUS) ×1 IMPLANT
COVER MAYO STAND STRL (DRAPES) ×3 IMPLANT
COVER SURGICAL LIGHT HANDLE (MISCELLANEOUS) ×3 IMPLANT
DERMABOND ADVANCED (GAUZE/BANDAGES/DRESSINGS) ×2
DERMABOND ADVANCED .7 DNX12 (GAUZE/BANDAGES/DRESSINGS) ×1 IMPLANT
DRAPE C-ARM 42X72 X-RAY (DRAPES) ×3 IMPLANT
DRSG TEGADERM 2-3/8X2-3/4 SM (GAUZE/BANDAGES/DRESSINGS) ×12 IMPLANT
ELECT REM PT RETURN 9FT ADLT (ELECTROSURGICAL) ×3
ELECTRODE REM PT RTRN 9FT ADLT (ELECTROSURGICAL) ×1 IMPLANT
GLOVE BIOGEL PI IND STRL 8 (GLOVE) ×1 IMPLANT
GLOVE BIOGEL PI INDICATOR 8 (GLOVE) ×2
GLOVE ECLIPSE 7.5 STRL STRAW (GLOVE) ×3 IMPLANT
GOWN STRL REUS W/ TWL LRG LVL3 (GOWN DISPOSABLE) ×3 IMPLANT
GOWN STRL REUS W/TWL LRG LVL3 (GOWN DISPOSABLE) ×6
KIT BASIN OR (CUSTOM PROCEDURE TRAY) ×3 IMPLANT
KIT ROOM TURNOVER OR (KITS) ×3 IMPLANT
NS IRRIG 1000ML POUR BTL (IV SOLUTION) ×3 IMPLANT
PAD ARMBOARD 7.5X6 YLW CONV (MISCELLANEOUS) ×3 IMPLANT
POUCH SPECIMEN RETRIEVAL 10MM (ENDOMECHANICALS) ×3 IMPLANT
SCISSORS LAP 5X35 DISP (ENDOMECHANICALS) ×3 IMPLANT
SET CHOLANGIOGRAPH 5 50 .035 (SET/KITS/TRAYS/PACK) ×3 IMPLANT
SET IRRIG TUBING LAPAROSCOPIC (IRRIGATION / IRRIGATOR) ×3 IMPLANT
SLEEVE ENDOPATH XCEL 5M (ENDOMECHANICALS) ×6 IMPLANT
SPECIMEN JAR SMALL (MISCELLANEOUS) ×3 IMPLANT
SUT MNCRL AB 4-0 PS2 18 (SUTURE) ×3 IMPLANT
TOWEL OR 17X24 6PK STRL BLUE (TOWEL DISPOSABLE) ×3 IMPLANT
TOWEL OR 17X26 10 PK STRL BLUE (TOWEL DISPOSABLE) ×3 IMPLANT
TRAY LAPAROSCOPIC MC (CUSTOM PROCEDURE TRAY) ×3 IMPLANT
TROCAR XCEL BLUNT TIP 100MML (ENDOMECHANICALS) ×3 IMPLANT
TROCAR XCEL NON-BLD 5MMX100MML (ENDOMECHANICALS) ×3 IMPLANT
TUBING INSUFFLATION (TUBING) ×3 IMPLANT

## 2014-10-15 NOTE — Progress Notes (Signed)
Patient back on floor from surgery. Family at bedside.

## 2014-10-15 NOTE — Progress Notes (Signed)
Patient sent to OR. Report called to short stay.

## 2014-10-15 NOTE — Anesthesia Preprocedure Evaluation (Addendum)
Anesthesia Evaluation  Patient identified by MRN, date of birth, ID band Patient awake    Reviewed: Allergy & Precautions, NPO status , Patient's Chart, lab work & pertinent test results  History of Anesthesia Complications Negative for: history of anesthetic complications  Airway Mallampati: I  TM Distance: >3 FB Neck ROM: Full    Dental  (+) Dental Advisory Given   Pulmonary neg pulmonary ROS,  breath sounds clear to auscultation        Cardiovascular negative cardio ROS  Rhythm:Regular Rate:Normal     Neuro/Psych PSYCHIATRIC DISORDERS (ADHD) Anxiety negative neurological ROS     GI/Hepatic Neg liver ROS, GERD-  Medicated and Poorly Controlled,  Endo/Other  Morbid obesity  Renal/GU negative Renal ROS     Musculoskeletal   Abdominal (+) + obese,   Peds  Hematology negative hematology ROS (+)   Anesthesia Other Findings   Reproductive/Obstetrics 10/14/14 preg test NEG                            Anesthesia Physical Anesthesia Plan  ASA: II  Anesthesia Plan: General   Post-op Pain Management:    Induction: Intravenous  Airway Management Planned: Oral ETT  Additional Equipment:   Intra-op Plan:   Post-operative Plan: Extubation in OR  Informed Consent: I have reviewed the patients History and Physical, chart, labs and discussed the procedure including the risks, benefits and alternatives for the proposed anesthesia with the patient or authorized representative who has indicated his/her understanding and acceptance.   Dental advisory given  Plan Discussed with: CRNA and Surgeon  Anesthesia Plan Comments: (Plan routine monitors, GETA)        Anesthesia Quick Evaluation

## 2014-10-15 NOTE — Transfer of Care (Signed)
Immediate Anesthesia Transfer of Care Note  Patient: Tanya Sanchez  Procedure(s) Performed: Procedure(s): LAPAROSCOPIC CHOLECYSTECTOMY WITH INTRAOPERATIVE CHOLANGIOGRAM (N/A)  Patient Location: PACU  Anesthesia Type:General  Level of Consciousness: awake, alert , oriented, patient cooperative and responds to stimulation  Airway & Oxygen Therapy: Patient Spontanous Breathing and Patient connected to nasal cannula oxygen  Post-op Assessment: Report given to RN, Post -op Vital signs reviewed and stable, Patient moving all extremities and Patient moving all extremities X 4  Post vital signs: Reviewed and stable  Last Vitals:  Filed Vitals:   10/15/14 1457  BP:   Pulse: 116  Temp: 36.5 C  Resp: 33    Complications: No apparent anesthesia complications

## 2014-10-15 NOTE — Progress Notes (Signed)
Symptomatic cholelithiasis.  For OR today.  Operation explained to the patient including risks and benefits.   Tanya Sanchez. Gae Bon, MD, FACS [phone] [facility]

## 2014-10-15 NOTE — Op Note (Signed)
OPERATIVE REPORT  DATE OF OPERATION:  10/15/2014  PATIENT:  Tanya Sanchez  24 y.o. female  PRE-OPERATIVE DIAGNOSIS:  SYMPTOMATIC CHOLELITHIASIS  POST-OPERATIVE DIAGNOSIS:  SYMPTOMATIC CHOLELITHIASIS  PROCEDURE:  Procedure(s): LAPAROSCOPIC CHOLECYSTECTOMY   SURGEON:  Surgeon(s): Jimmye Norman, MD  ASSISTANT: Bowman RNFA  ANESTHESIA:   general  EBL: <20 ml  BLOOD ADMINISTERED: none  DRAINS: none   SPECIMEN:  Source of Specimen:  Gallbladder and contents  COUNTS CORRECT:  YES  PROCEDURE DETAILS: The patient was taken to the operating room and placed on the table in the supine position.  After an adequate endotracheal anesthetic was administered, the patient was prepped with ChloroPrep, and then draped in the usual manner exposing the entire abdomen laterally, inferiorly and up  to the costal margins.  After a proper timeout was performed including identifying the patient and the procedure to be performed, a supraumbilical 1.5cm midline incision was made using a #15 blade.  This was taken down to the fascia which was then incised with a #15 blade.  The edges of the fascia were tented up with Kocher clamps as the preperitoneal space was penetrated with a Kelly clamp into the peritoneum.  Once this was done, a pursestring suture of 0 Vicryl was passed around the fascial opening.  This was subsequently used to secure the [facility] cannula which was passed into the peritoneal cavity.  Once the [facility] cannula was in place, carbon dioxide gas was insufflated into the peritoneal cavity up to a maximal intra-abdominal pressure of 15mm Hg.The laparoscope, with attached camera and light source, was passed into the peritoneal cavity to visualize the direct insertion of two right upper quadrant 5mm cannulas, and a sup-xiphoid 5mm cannula.  Once all cannulas were in place, the dissection was begun.  Two ratcheted graspers were attached to the dome and infundibulum of the gallbladder and retracted  towards the anterior abdominal wall and the right upper quadrant.  Using cautery attached to a dissecting forceps, the peritoneum overlaying the triangle of Chalot and the hepatoduodenal triangle was dissected away exposing the cystic duct and the cystic artery.  A critical window was developed between the CBD and the cystic duct The cystic artery was clipped proximally and distally then transected.  A clip was placed on the gallbladder side of the cystic duct, then the distal cystic duct was clipped multiple times then transected between the clips.  The gallbladder was then dissected out of the hepatic bed without event.  It was retrieved from the abdomen (using an EndoCatch bag) without event.  Once the gallbladder was removed, the bed was inspected for hemostasis.  Once excellent hemostasis was obtained all gas and fluids were aspirated from above the liver, then the cannulas were removed.  The supraumbilical incision was closed using the pursestring suture which was in place.  0.25% bupivicaine with epinephrine was injected at all sites.  All 10mm or greater cannula sites were close using a running subcuticular stitch of 4-0 Monocryl.  5.37mm cannula sites were closed with Dermabond only.Steri-Strips and Tagaderm were used to complete the dressings at all sites.  At this point all needle, sponge, and instrument counts were correct.The patient was awakened from anesthesia and taken to the PACU in stable condition.   PATIENT DISPOSITION:  PACU - hemodynamically stable.   Sylvanna Burggraf 7/25/20162:37 PM

## 2014-10-15 NOTE — Anesthesia Procedure Notes (Addendum)
Procedure Name: Intubation Date/Time: 10/15/2014 1:50 PM Performed by: Marena Chancy Pre-anesthesia Checklist: Patient identified, Timeout performed, Emergency Drugs available, Suction available and Patient being monitored Patient Re-evaluated:Patient Re-evaluated prior to inductionOxygen Delivery Method: Circle system utilized Preoxygenation: Pre-oxygenation with 100% oxygen Intubation Type: IV induction, Cricoid Pressure applied and Rapid sequence Ventilation: Mask ventilation without difficulty Laryngoscope Size: Miller and 2 Grade View: Grade I Tube type: Oral Tube size: 7.0 mm Number of attempts: 1 Airway Equipment and Method: Stylet Placement Confirmation: ETT inserted through vocal cords under direct vision,  breath sounds checked- equal and bilateral and positive ETCO2 Tube secured with: Tape Dental Injury: Teeth and Oropharynx as per pre-operative assessment

## 2014-10-15 NOTE — Discharge Summary (Signed)
  Physician Discharge Summary  Patient ID: Tanya Sanchez MRN: [id number] DOB/AGE: 1990-10-26 24 y.o.  Admit date: 10/14/2014 Discharge date: 10/16/2014  Admitting Diagnosis: Symptomatic cholelithiasis  Discharge Diagnosis Patient Active Problem List   Diagnosis Date Noted  . Symptomatic cholelithiasis 10/14/2014  . Epigastric abdominal pain 10/11/2014  . Chronic tension headaches 10/11/2014  . Vitamin D deficiency 07/31/2014  . Panic attacks 07/30/2014  . Poor dentition 07/30/2014  . Allergic rhinitis 07/30/2014    Consultants none  Imaging: US Abdomen Complete  10/14/2014   CLINICAL DATA:  Right upper quadrant pain.  EXAM: ULTRASOUND ABDOMEN COMPLETE  COMPARISON:  None.  FINDINGS: Gallbladder: Cholelithiasis. No gallbladder wall thickening or pericholecystic fluid. Negative sonographic Murphy sign.  Common bile duct: Diameter: 3 mm  Liver: No focal lesion identified. Within normal limits in parenchymal echogenicity.  IVC: No abnormality visualized.  Pancreas: Visualized portion unremarkable.  Spleen: Size and appearance within normal limits.  Right Kidney: Length: 10 cm. Echogenicity within normal limits. No mass or hydronephrosis visualized.  Left Kidney: Length: 10.4 cm. Echogenicity within normal limits. No mass or hydronephrosis visualized.  Abdominal aorta: No aneurysm visualized.  Other findings: None.  IMPRESSION: 1. Cholelithiasis without sonographic evidence of acute cholecystitis.   Electronically Signed   By: Elige Ko   On: 10/14/2014 15:10    Procedures Laparoscopic cholecystectomy---Dr. Rexene Edison Course:  Tanya Sanchez is a healthy 24 year old female who presented to [facility] with refractory abdominal pain.  Workup showed gallstones.  Patient was admitted and underwent procedure listed above.  Tolerated procedure well and was transferred to the floor.  Diet was advanced as tolerated.  On POD#1, the patient was voiding well, tolerating diet, ambulating  well, pain well controlled, vital signs stable, incisions c/d/i and felt stable for discharge home. Medication risks, benefits and therapeutic alternatives were reviewed with the patient.  She verbalizes understanding.   Patient will follow up in our office in 3 weeks and knows to call with questions or concerns.  Abd: +bs, soft, mild tender at incision sites.  Incisions are c/d/i.     Medication List    TAKE these medications        acetaminophen 325 MG tablet  Commonly known as:  TYLENOL  Take 650 mg by mouth every 6 (six) hours as needed for headache.     cetirizine 10 MG tablet  Commonly known as:  ZYRTEC  Take 1 tablet (10 mg total) by mouth daily.     fluticasone 50 MCG/ACT nasal spray  Commonly known as:  FLONASE  Place 2 sprays into both nostrils daily.     omeprazole 20 MG capsule  Commonly known as:  PRILOSEC  Take 1 capsule (20 mg total) by mouth daily.     ONE-A-DAY WOMENS FORMULA PO  Take 1 tablet by mouth daily.     oxyCODONE-acetaminophen 5-325 MG per tablet  Commonly known as:  PERCOCET/ROXICET  Take 2 tablets by mouth once.             Follow-up Information    Follow up with [facility] On 11/06/2014.   Specialty:  General Surgery   Why:  arrive by 3PM for a 3:30PM post op check up   Contact information:   [address] [place] [address] [phone]       Signed: Ashok Norris, [facility] [phone]  10/15/2014, 3:45 PM

## 2014-10-15 NOTE — Progress Notes (Signed)
Daily dose of Rocephin 2g in D5% IVPB due at 2130 on Sunday 10/14/14 was not administered to patient. I noticed  coming on my shift that bag was still full; could have been due to malfunction with secondary set. Bag was troubleshooted but still not effective in administration. Will continue with next daily dose.

## 2014-10-15 NOTE — Anesthesia Postprocedure Evaluation (Signed)
  Anesthesia Post-op Note  Patient: Tanya Sanchez  Procedure(s) Performed: Procedure(s): LAPAROSCOPIC CHOLECYSTECTOMY WITH INTRAOPERATIVE CHOLANGIOGRAM (N/A)  Patient Location: PACU  Anesthesia Type:General  Level of Consciousness: awake, alert , oriented and patient cooperative  Airway and Oxygen Therapy: Patient Spontanous Breathing  Post-op Pain: mild  Post-op Assessment: Post-op Vital signs reviewed, Patient's Cardiovascular Status Stable, Respiratory Function Stable, Patent Airway, No signs of Nausea or vomiting and Pain level controlled              Post-op Vital Signs: Reviewed and stable  Last Vitals:  Filed Vitals:   10/15/14 1551  BP: 114/61  Pulse: 71  Temp: 36.8 C  Resp: 12    Complications: No apparent anesthesia complications

## 2014-10-15 NOTE — Discharge Instructions (Signed)

## 2014-10-16 ENCOUNTER — Encounter (HOSPITAL_COMMUNITY): Payer: Self-pay | Admitting: General Surgery

## 2014-10-16 MED ORDER — OXYCODONE-ACETAMINOPHEN 5-325 MG PO TABS: 1.0000 | ORAL_TABLET | Freq: Four times a day (QID) | ORAL | Status: DC | PRN

## 2014-10-17 ENCOUNTER — Telehealth: Payer: Self-pay | Admitting: *Deleted

## 2014-10-17 NOTE — Telephone Encounter (Signed)
Unable to contact pt  Per prescribe request phone not accepting call at this time

## 2014-10-17 NOTE — Telephone Encounter (Signed)
-----   Message from Dessa Phi, MD sent at 10/12/2014  1:12 PM EDT ----- Normal CMP and lipase no evidence of acute gallbladder inflammation Vit D improved from deficiency to insufficiency continue supplement with daily multivitamin

## 2014-10-18 ENCOUNTER — Ambulatory Visit (HOSPITAL_COMMUNITY): Admission: RE | Admit: 2014-10-18 | Payer: Self-pay | Source: Ambulatory Visit | Admitting: Family Medicine

## 2014-10-18 NOTE — Telephone Encounter (Signed)
US cancelled  

## 2014-10-18 NOTE — Telephone Encounter (Signed)
Called patient. Informed of her labs. She is doing well post cholecystectomy. She request cancel of [place]

## 2014-10-22 ENCOUNTER — Telehealth: Payer: Self-pay | Admitting: *Deleted

## 2014-10-22 NOTE — Telephone Encounter (Signed)
Unable to contact Pt  Prescriber do no accept incoming calls  Send communication mail to pt

## 2014-10-22 NOTE — Telephone Encounter (Signed)
-----   Message from Josalyn Funches, MD sent at 10/12/2014  1:12 PM EDT ----- Normal CMP and lipase no evidence of acute gallbladder inflammation Vit D improved from deficiency to insufficiency continue supplement with daily multivitamin 

## 2014-10-26 ENCOUNTER — Other Ambulatory Visit: Payer: Self-pay | Admitting: Surgery

## 2015-01-04 ENCOUNTER — Ambulatory Visit: Payer: Self-pay | Admitting: Family Medicine

## 2015-01-23 ENCOUNTER — Encounter (HOSPITAL_COMMUNITY): Payer: Self-pay | Admitting: *Deleted

## 2015-01-23 ENCOUNTER — Emergency Department (HOSPITAL_COMMUNITY)
Admission: EM | Admit: 2015-01-23 | Discharge: 2015-01-23 | Disposition: A | Discharge status: Home / Self Care | Payer: Self-pay | Attending: Emergency Medicine | Admitting: Emergency Medicine

## 2015-01-23 ENCOUNTER — Emergency Department (HOSPITAL_COMMUNITY): Payer: Self-pay

## 2015-01-23 DIAGNOSIS — Z3202 Encounter for pregnancy test, result negative: Secondary | ICD-10-CM | POA: Insufficient documentation

## 2015-01-23 DIAGNOSIS — Z8659 Personal history of other mental and behavioral disorders: Secondary | ICD-10-CM | POA: Insufficient documentation

## 2015-01-23 DIAGNOSIS — T148XXA Other injury of unspecified body region, initial encounter: Secondary | ICD-10-CM

## 2015-01-23 DIAGNOSIS — R1032 Left lower quadrant pain: Secondary | ICD-10-CM | POA: Insufficient documentation

## 2015-01-23 DIAGNOSIS — R109 Unspecified abdominal pain: Secondary | ICD-10-CM

## 2015-01-23 DIAGNOSIS — Z8719 Personal history of other diseases of the digestive system: Secondary | ICD-10-CM | POA: Insufficient documentation

## 2015-01-23 LAB — CBC
HCT: 34.9 % — ABNORMAL LOW (ref 36.0–46.0)
Hemoglobin: 11.9 g/dL — ABNORMAL LOW (ref 12.0–15.0)
MCH: 29.8 pg (ref 26.0–34.0)
MCHC: 34.1 g/dL (ref 30.0–36.0)
MCV: 87.5 fL (ref 78.0–100.0)
Platelets: 214 10*3/uL (ref 150–400)
RBC: 3.99 MIL/uL (ref 3.87–5.11)
RDW: 12.2 % (ref 11.5–15.5)
WBC: 7.6 10*3/uL (ref 4.0–10.5)

## 2015-01-23 LAB — COMPREHENSIVE METABOLIC PANEL
ALT: 15 U/L (ref 14–54)
ANION GAP: 9 (ref 5–15)
AST: 18 U/L (ref 15–41)
Albumin: 4.1 g/dL (ref 3.5–5.0)
Alkaline Phosphatase: 60 U/L (ref 38–126)
BUN: 9 mg/dL (ref 6–20)
CHLORIDE: 102 mmol/L (ref 101–111)
CO2: 27 mmol/L (ref 22–32)
Calcium: 9.3 mg/dL (ref 8.9–10.3)
Creatinine, Ser: 0.68 mg/dL (ref 0.44–1.00)
GFR calc non Af Amer: >60 mL/min (ref >60)
Glucose, Bld: 67 mg/dL (ref 65–99)
Potassium: 4.2 mmol/L (ref 3.5–5.1)
Sodium: 138 mmol/L (ref 135–145)
Total Bilirubin: 0.3 mg/dL (ref 0.3–1.2)
Total Protein: 6.8 g/dL (ref 6.5–8.1)

## 2015-01-23 LAB — URINALYSIS, ROUTINE W REFLEX MICROSCOPIC
BILIRUBIN URINE: NEGATIVE
Glucose, UA: NEGATIVE mg/dL
Hgb urine dipstick: NEGATIVE
Ketones, ur: NEGATIVE mg/dL
NITRITE: NEGATIVE
PH: 7.5 (ref 5.0–8.0)
Protein, ur: NEGATIVE mg/dL
SPECIFIC GRAVITY, URINE: 1.022 (ref 1.005–1.030)
Urobilinogen, UA: 0.2 mg/dL (ref 0.0–1.0)

## 2015-01-23 LAB — URINE MICROSCOPIC-ADD ON

## 2015-01-23 LAB — LIPASE, BLOOD: LIPASE: 37 U/L (ref 11–51)

## 2015-01-23 LAB — POC URINE PREG, ED: Preg Test, Ur: NEGATIVE

## 2015-01-23 NOTE — ED Notes (Signed)
Pt reports LLQ abdominal pain that started 3 days ago. Pt denies any N/V/D or urinary symptoms. Tender to palpation and states that pain is worse with movement.

## 2015-01-23 NOTE — Discharge Instructions (Signed)
Return without fail for worsening symptoms, including fever, worsening pain, vomiting and unable to keep down food/fluids, or any other symptoms concerning to you. Continue to take motrin or tylenol as needed for pain control.   Abdominal Pain, Adult Many things can cause belly (abdominal) pain. Most times, the belly pain is not dangerous. Many cases of belly pain can be watched and treated at home. HOME CARE   Do not take medicines that help you go poop (laxatives) unless told to by your doctor.  Only take medicine as told by your doctor.  Eat or drink as told by your doctor. Your doctor will tell you if you should be on a special diet. GET HELP IF:  You do not know what is causing your belly pain.  You have belly pain while you are sick to your stomach (nauseous) or have runny poop (diarrhea).  You have pain while you pee or poop.  Your belly pain wakes you up at night.  You have belly pain that gets worse or better when you eat.  You have belly pain that gets worse when you eat fatty foods.  You have a fever. GET HELP RIGHT AWAY IF:   The pain does not go away within 2 hours.  You keep throwing up (vomiting).  The pain changes and is only in the right or left part of the belly.  You have bloody or tarry looking poop. MAKE SURE YOU:   Understand these instructions.  Will watch your condition.  Will get help right away if you are not doing well or get worse.   This information is not intended to replace advice given to you by your health care provider. Make sure you discuss any questions you have with your health care provider.   Document Released: 08/26/2007 Document Revised: 03/30/2014 Document Reviewed: 11/16/2012 Elsevier Interactive Patient Education [employer]2016 Elsevier Inc.

## 2015-01-23 NOTE — ED Provider Notes (Signed)
CSN: [id number]     Arrival date & time 01/23/15  1856 History   First MD Initiated Contact with Patient 01/23/15 2100     Chief Complaint  Patient presents with  . Abdominal Pain     (Consider location/radiation/quality/duration/timing/severity/associated sxs/prior Treatment) HPI 24 year old female with history of cholecystectomy who presents to emergency department with left lower abdominal pain. Mom states that for the past 2 days as she has had sharp intermittent abdominal pain in the lower aspect of her abdomen. Pain is worsened with movement, but worsening in severity and appears to be more constant over the past day. Has not had any nausea, vomiting, diarrhea, constipation, dysuria, urinary frequency, flank pain, fevers or chills, vaginal discharge or vaginal bleeding. States that typically she starts her menses at this time, but has not started it yet. Also has had to do a lot of heavy lifting at work over the past week, and notes that pain initially seemed to improve after applying icy hot, but pain then came back.    Past Medical History  Diagnosis Date  . ADHD (attention deficit hyperactivity disorder)   . Gallstones   . Panic attack 2016   Past Surgical History  Procedure Laterality Date  . Cholecystectomy N/A 10/15/2014    Procedure: LAPAROSCOPIC CHOLECYSTECTOMY WITH INTRAOPERATIVE CHOLANGIOGRAM;  Surgeon: Jimmye Norman, MD;  Location: [facility];  Service: General;  Laterality: N/A;   Family History  Problem Relation Age of Onset  . Heart disease Maternal Grandmother   . Hypertension Maternal Grandmother   . Depression Maternal Grandmother   . Arthritis Maternal Grandmother   . Depression Mother   . Cancer Mother   . Depression Sister   . Bladder Cancer Mother    Social History  Substance Use Topics  . Smoking status: Never Smoker   . Smokeless tobacco: Never Used  . Alcohol Use: No   OB History    No data available     Review of Systems 10/14 systems reviewed and  are negative other than those stated in the HPI    Allergies  Review of patient's allergies indicates no known allergies.  Home Medications   Prior to Admission medications   Medication Sig Start Date End Date Taking? Authorizing Provider  acetaminophen (TYLENOL) 325 MG tablet Take 650 mg by mouth every 6 (six) hours as needed for headache.   Yes Historical Provider, MD  cetirizine (ZYRTEC) 10 MG tablet Take 1 tablet (10 mg total) by mouth daily. Patient not taking: Reported on 01/23/2015 07/30/14   Dessa Phi, MD  fluticasone (FLONASE) 50 MCG/ACT nasal spray Place 2 sprays into both nostrils daily. Patient not taking: Reported on 01/23/2015 10/11/14   Dessa Phi, MD  omeprazole (PRILOSEC) 20 MG capsule Take 1 capsule (20 mg total) by mouth daily. Patient not taking: Reported on 01/23/2015 10/11/14   Dessa Phi, MD  oxyCODONE-acetaminophen (PERCOCET/ROXICET) 5-325 MG per tablet Take 1-2 tablets by mouth every 6 (six) hours as needed for moderate pain or severe pain. Patient not taking: Reported on 01/23/2015 10/16/14   Ashok Norris, NP   BP 111/64 mmHg  Pulse 68  Temp(Src) 98.1 F (36.7 C) (Oral)  Resp 18  SpO2 99%  LMP 12/26/2014 Physical Exam Physical Exam  Nursing note and vitals reviewed. Constitutional: Well developed, well nourished, non-toxic, and in no acute distress Head: Normocephalic and atraumatic.  Mouth/Throat: Oropharynx is clear and moist.  Neck: Normal range of motion. Neck supple.  Cardiovascular: Normal rate and regular rhythm.   Pulmonary/Chest:  Effort normal and breath sounds normal.  Abdominal: Soft. There is tenderness to the left adnexal region of abdomen. Pain also reproduced with movement. There is no rebound and no guarding.  Musculoskeletal: Normal range of motion.  Neurological: Alert, no facial droop, fluent speech, moves all extremities symmetrically Skin: Skin is warm and dry.  Psychiatric: Cooperative  ED Course  Procedures  (including critical care time) Labs Review Labs Reviewed  CBC - Abnormal; Notable for the following:    Hemoglobin 11.9 (*)    HCT 34.9 (*)    All other components within normal limits  URINALYSIS, ROUTINE W REFLEX MICROSCOPIC (NOT AT [facility]) - Abnormal; Notable for the following:    APPearance CLOUDY (*)    Leukocytes, UA LARGE (*)    All other components within normal limits  URINE MICROSCOPIC-ADD ON - Abnormal; Notable for the following:    Squamous Epithelial / LPF FEW (*)    Bacteria, UA FEW (*)    Casts GRANULAR CAST (*)    All other components within normal limits  LIPASE, BLOOD  COMPREHENSIVE METABOLIC PANEL  POC URINE PREG, ED    Imaging Review [place] Transvaginal Non-ob  01/23/2015  CLINICAL DATA:  Left-sided abdominal pain. Left lower quadrant pain for 3 days. EXAM: TRANSABDOMINAL AND TRANSVAGINAL ULTRASOUND OF PELVIS TECHNIQUE: Both transabdominal and transvaginal ultrasound examinations of the pelvis were performed. Transabdominal technique was performed for global imaging of the pelvis including uterus, ovaries, adnexal regions, and pelvic cul-de-sac. It was necessary to proceed with endovaginal exam following the transabdominal exam to visualize the uterus and ovaries. COMPARISON:  None FINDINGS: Uterus Measurements: 7.6 x 3.9 x 3.7 cm. Uterus is anteverted. Focal hypoechoic nodular area in the anterior lower uterine segment with contour deformity suggesting a small fibroid. Small nabothian cysts in the cervix. Endometrium Thickness: 13 mm.  No focal abnormality visualized. Right ovary Measurements: 3 x 1.6 x 2.9 cm. Normal appearance/no adnexal mass. Left ovary Measurements: 1.7 x 1.7 x 1.8 cm. Normal appearance/no adnexal mass. Other findings Small amount of free fluid in the pelvis localized to the cul-de-sac and right adnexal region. Peristalsing bowel is demonstrated. IMPRESSION: Small uterine fibroid. Normal appearance of the uterus and ovaries otherwise. Normal premenopausal  endometrial stripe thickness. Small amount of free fluid in the pelvis is likely physiologic. Electronically Signed   By: Burman NievesWilliam  Stevens M.D.   On: 01/23/2015 22:45   [place] Pelvis Complete  01/23/2015  CLINICAL DATA:  Left-sided abdominal pain. Left lower quadrant pain for 3 days. EXAM: TRANSABDOMINAL AND TRANSVAGINAL ULTRASOUND OF PELVIS TECHNIQUE: Both transabdominal and transvaginal ultrasound examinations of the pelvis were performed. Transabdominal technique was performed for global imaging of the pelvis including uterus, ovaries, adnexal regions, and pelvic cul-de-sac. It was necessary to proceed with endovaginal exam following the transabdominal exam to visualize the uterus and ovaries. COMPARISON:  None FINDINGS: Uterus Measurements: 7.6 x 3.9 x 3.7 cm. Uterus is anteverted. Focal hypoechoic nodular area in the anterior lower uterine segment with contour deformity suggesting a small fibroid. Small nabothian cysts in the cervix. Endometrium Thickness: 13 mm.  No focal abnormality visualized. Right ovary Measurements: 3 x 1.6 x 2.9 cm. Normal appearance/no adnexal mass. Left ovary Measurements: 1.7 x 1.7 x 1.8 cm. Normal appearance/no adnexal mass. Other findings Small amount of free fluid in the pelvis localized to the cul-de-sac and right adnexal region. Peristalsing bowel is demonstrated. IMPRESSION: Small uterine fibroid. Normal appearance of the uterus and ovaries otherwise. Normal premenopausal endometrial stripe thickness. Small amount of  free fluid in the pelvis is likely physiologic. Electronically Signed   By: Burman Nieves M.D.   On: 01/23/2015 22:45   I have personally reviewed and evaluated these images and lab results as part of my medical decision-making.    MDM   Final diagnoses:  Left sided abdominal pain  Muscle strain    In short, this is a 24 year old female with history of cholecystectomy who presents with left lower abdominal pain. She is well-appearing and in no acute  distress on presentation. Vital signs are within normal limits. She has a soft and non-peritoneal abdomen, with primarily left adnexal pain to tenderness. However with just movement of her body she also states that pain is exacerbated, making the suggestive of more musculoskeletal pain especially in the setting of recent heavy lifting. She is not pregnant and basic blood work including CBC, comprehensive metabolic panel, lipase and urinalysis unremarkable. Pelvic ultrasound was also performed to rule out ovarian cyst/torsion, and is overall unremarkable aside from small fibroid which I do not think is the etiology of her symptoms. She is given Toradol for pain control, was significantly good relief. I do not feel that she requires any further imaging at this time. Discussed supportive care for musculoskeletal pain. She expressed understanding of all discharge instructions and felt comfortable with the plan of care.    Lavera Guise, MD 01/24/15 [phone]

## 2015-02-26 ENCOUNTER — Encounter: Payer: Self-pay | Admitting: Family Medicine

## 2015-02-26 ENCOUNTER — Ambulatory Visit: Payer: Self-pay | Attending: Family Medicine | Admitting: Internal Medicine

## 2015-02-26 VITALS — BP 111/72 | HR 83 | Temp 98.4°F | Resp 16 | Ht 62.0 in | Wt 187.0 lb

## 2015-02-26 DIAGNOSIS — Z0000 Encounter for general adult medical examination without abnormal findings: Secondary | ICD-10-CM | POA: Insufficient documentation

## 2015-02-26 DIAGNOSIS — G44229 Chronic tension-type headache, not intractable: Secondary | ICD-10-CM | POA: Insufficient documentation

## 2015-02-26 DIAGNOSIS — G4700 Insomnia, unspecified: Secondary | ICD-10-CM | POA: Insufficient documentation

## 2015-02-26 DIAGNOSIS — F419 Anxiety disorder, unspecified: Secondary | ICD-10-CM | POA: Insufficient documentation

## 2015-02-26 DIAGNOSIS — R42 Dizziness and giddiness: Secondary | ICD-10-CM | POA: Insufficient documentation

## 2015-02-26 DIAGNOSIS — F909 Attention-deficit hyperactivity disorder, unspecified type: Secondary | ICD-10-CM | POA: Insufficient documentation

## 2015-02-26 MED ORDER — AMITRIPTYLINE HCL 25 MG PO TABS: 25.0000 mg | ORAL_TABLET | Freq: Every day | ORAL | Status: DC

## 2015-02-26 NOTE — Patient Instructions (Addendum)
Keep a headache diary and bring it back with you to follow up with Funches   Tension Headache A tension headache is a feeling of pain, pressure, or aching that is often felt over the front and sides of the head. The pain can be dull, or it can feel tight (constricting). Tension headaches are not normally associated with nausea or vomiting, and they do not get worse with physical activity. Tension headaches can last from 30 minutes to several days. This is the most common type of headache. CAUSES The exact cause of this condition is not known. Tension headaches often begin after stress, anxiety, or depression. Other triggers may include:  Alcohol.  Too much caffeine, or caffeine withdrawal.  Respiratory infections, such as colds, flu, or sinus infections.  Dental problems or teeth clenching.  Fatigue.  Holding your head and neck in the same position for a long period of time, such as while using a computer.  Smoking. SYMPTOMS Symptoms of this condition include:  A feeling of pressure around the head.  Dull, aching head pain.  Pain felt over the front and sides of the head.  Tenderness in the muscles of the head, neck, and shoulders. DIAGNOSIS This condition may be diagnosed based on your symptoms and a physical exam. Tests may be done, such as a CT scan or an MRI of your head. These tests may be done if your symptoms are severe or unusual. TREATMENT This condition may be treated with lifestyle changes and medicines to help relieve symptoms. HOME CARE INSTRUCTIONS Managing Pain  Take over-the-counter and prescription medicines only as told by your health care provider.  Lie down in a dark, quiet room when you have a headache.  If directed, apply ice to the head and neck area:  Put ice in a plastic bag.  Place a towel between your skin and the bag.  Leave the ice on for 20 minutes, 2-3 times per day.  Use a heating pad or a hot shower to apply heat to the head and neck  area as told by your health care provider. Eating and Drinking  Eat meals on a regular schedule.  Limit alcohol use.  Decrease your caffeine intake, or stop using caffeine. General Instructions  Keep all follow-up visits as told by your health care provider. This is important.  Keep a headache journal to help find out what may trigger your headaches. For example, write down:  What you eat and drink.  How much sleep you get.  Any change to your diet or medicines.  Try massage or other relaxation techniques.  Limit stress.  Sit up straight, and avoid tensing your muscles.  Do not use tobacco products, including cigarettes, chewing tobacco, or e-cigarettes. If you need help quitting, ask your health care provider.  Exercise regularly as told by your health care provider.  Get 7-9 hours of sleep, or the amount recommended by your health care provider. SEEK MEDICAL CARE IF:  Your symptoms are not helped by medicine.  You have a headache that is different from what you normally experience.  You have nausea or you vomit.  You have a fever. SEEK IMMEDIATE MEDICAL CARE IF:  Your headache becomes severe.  You have repeated vomiting.  You have a stiff neck.  You have a loss of vision.  You have problems with speech.  You have pain in your eye or ear.  You have muscular weakness or loss of muscle control.  You lose your balance or you  have trouble walking.  You feel faint or you pass out.  You have confusion.   This information is not intended to replace advice given to you by your health care provider. Make sure you discuss any questions you have with your health care provider.   Document Released: 03/09/2005 Document Revised: 11/28/2014 Document Reviewed: 07/02/2014 Elsevier Interactive Patient Education [employer]

## 2015-02-26 NOTE — Progress Notes (Signed)
Migraine  Pain scale 6 No tobacco user  No suicidal thoughts in the past two weeks

## 2015-02-26 NOTE — Progress Notes (Signed)
Patient ID: Tanya Sanchez, female   DOB: 1991-03-10, 24 y.o.   MRN: [id number]  CC: migraines   HPI: Tanya Sanchez is a 24 y.o. female here today for a follow up visit.  Patient has past medical history of frequent headaches, ADHD, anxiety. She states that she has been having frequent headaches since 2015. Headaches are located in bilateral temples and across forehead. Pain described as a throbbing pain and tightness. Some associated dizziness. Occasional nausea and blurred vision with headache attack. Headaches are usually every other day. Usually last 30 minutes to a few hours. Exacerbated by bright lights and loud noises. Has been taking tylenol and excedrin tension that provide moderate relief. She notes significant improvement in stress level but admits to less than 3 hours of sleep several nights per week. She has tried several OTC sleep aids without relief.   No Known Allergies Past Medical History  Diagnosis Date  . ADHD (attention deficit hyperactivity disorder)   . Gallstones   . Panic attack 2016   Current Outpatient Prescriptions on File Prior to Visit  Medication Sig Dispense Refill  . acetaminophen (TYLENOL) 325 MG tablet Take 650 mg by mouth every 6 (six) hours as needed for headache.    . cetirizine (ZYRTEC) 10 MG tablet Take 1 tablet (10 mg total) by mouth daily. 30 tablet 11  . fluticasone (FLONASE) 50 MCG/ACT nasal spray Place 2 sprays into both nostrils daily. 16 g 6  . omeprazole (PRILOSEC) 20 MG capsule Take 1 capsule (20 mg total) by mouth daily. (Patient not taking: Reported on 02/26/2015) 30 capsule 3  . oxyCODONE-acetaminophen (PERCOCET/ROXICET) 5-325 MG per tablet Take 1-2 tablets by mouth every 6 (six) hours as needed for moderate pain or severe pain. (Patient not taking: Reported on 01/23/2015) 40 tablet 0   No current facility-administered medications on file prior to visit.   Family History  Problem Relation Age of Onset  . Heart disease Maternal  Grandmother   . Hypertension Maternal Grandmother   . Depression Maternal Grandmother   . Arthritis Maternal Grandmother   . Depression Mother   . Cancer Mother   . Depression Sister   . Bladder Cancer Mother    Social History   Social History  . Marital Status: Single    Spouse Name: N/A  . Number of Children: 0  . Years of Education: 11   Occupational History  . [employer]     Social History Main Topics  . Smoking status: Never Smoker   . Smokeless tobacco: Never Used  . Alcohol Use: No  . Drug Use: No  . Sexual Activity: No   Other Topics Concern  . Not on file   Social History Narrative   Lives with mom, sister, niece, sister's fiance.    Grandma lives next doors.       From  [place], [place] to [place] in 08/2013.     Review of Systems: Other than what is stated in HPI, all other systems are negative.   Objective:   Filed Vitals:   02/26/15 1518  BP: 111/72  Pulse: 83  Temp: 98.4 F (36.9 C)  Resp: 16    Physical Exam  Constitutional: She is oriented to person, place, and time.  Eyes: Conjunctivae and EOM are normal. Pupils are equal, round, and reactive to light.  Cardiovascular: Normal rate, regular rhythm and normal heart sounds.   Pulmonary/Chest: Effort normal and breath sounds normal.  Neurological: She is alert and oriented to  person, place, and time. No cranial nerve deficit. Coordination normal.  Skin: Skin is warm and dry.  Psychiatric: She has a normal mood and affect.     Lab Results  Component Value Date   WBC 7.6 01/23/2015   HGB 11.9* 01/23/2015   HCT 34.9* 01/23/2015   MCV 87.5 01/23/2015   PLT 214 01/23/2015   Lab Results  Component Value Date   CREATININE 0.68 01/23/2015   BUN 9 01/23/2015   NA 138 01/23/2015   K 4.2 01/23/2015   CL 102 01/23/2015   CO2 27 01/23/2015    No results found for: HGBA1C Lipid Panel  No results found for: CHOL, TRIG, HDL, CHOLHDL, VLDL, LDLCALC     Assessment and plan:   Tanya Sanchez  was seen today for migraine.  Diagnoses and all orders for this visit:  Chronic tension-type headache, not intractable -     amitriptyline (ELAVIL) 25 MG tablet; Take 1 tablet (25 mg total) by mouth at bedtime. She will begin Elavil at bedtime to hopefully help with headache prophylaxis. She will keep a headache diary and bring it back to her follow up with her PCP. I have also explained that Elavil is not a medication you take during a attack but with prolonged use it should help the frequency of headaches. She may continue tylenol for pain.  Insomnia Addressed sleep hygiene with patient. Explained that lack of sleep may exacerbate headaches. Elavil may help with sleep as well.   Healthcare maintenance -     Flu Vaccine QUAD 36+ mos PF IM (Fluarix & Fluzone Quad PF)  Return for 6-8 weeks with Funches .        [name], NP-C [facility] [phone] 02/26/2015, 4:21 PM

## 2015-06-14 IMAGING — CR DG ABDOMEN ACUTE W/ 1V CHEST
3 series · 3 of 3 positions shown · non-contrast
Comparison: 11/09/2013 chest extra

CLINICAL DATA: Epigastric pain.  Smoking history

EXAM:
ACUTE ABDOMEN SERIES (ABDOMEN 2 VIEW & CHEST 1 VIEW)

[w chest pa]
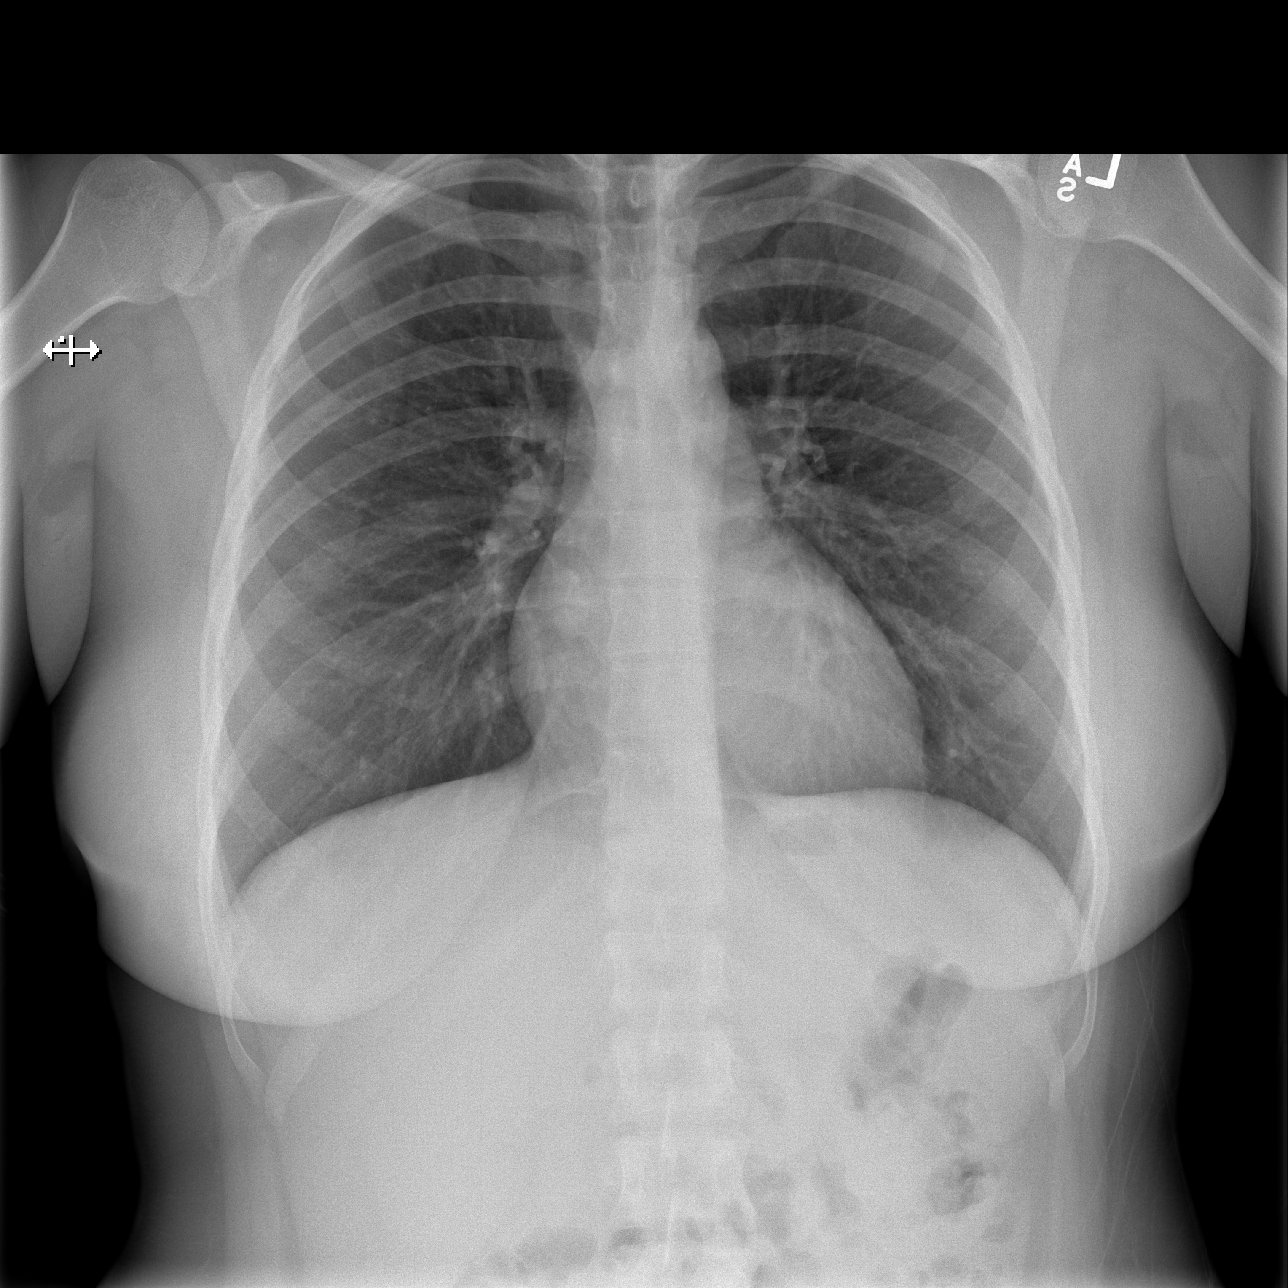

[w abdomen upright]
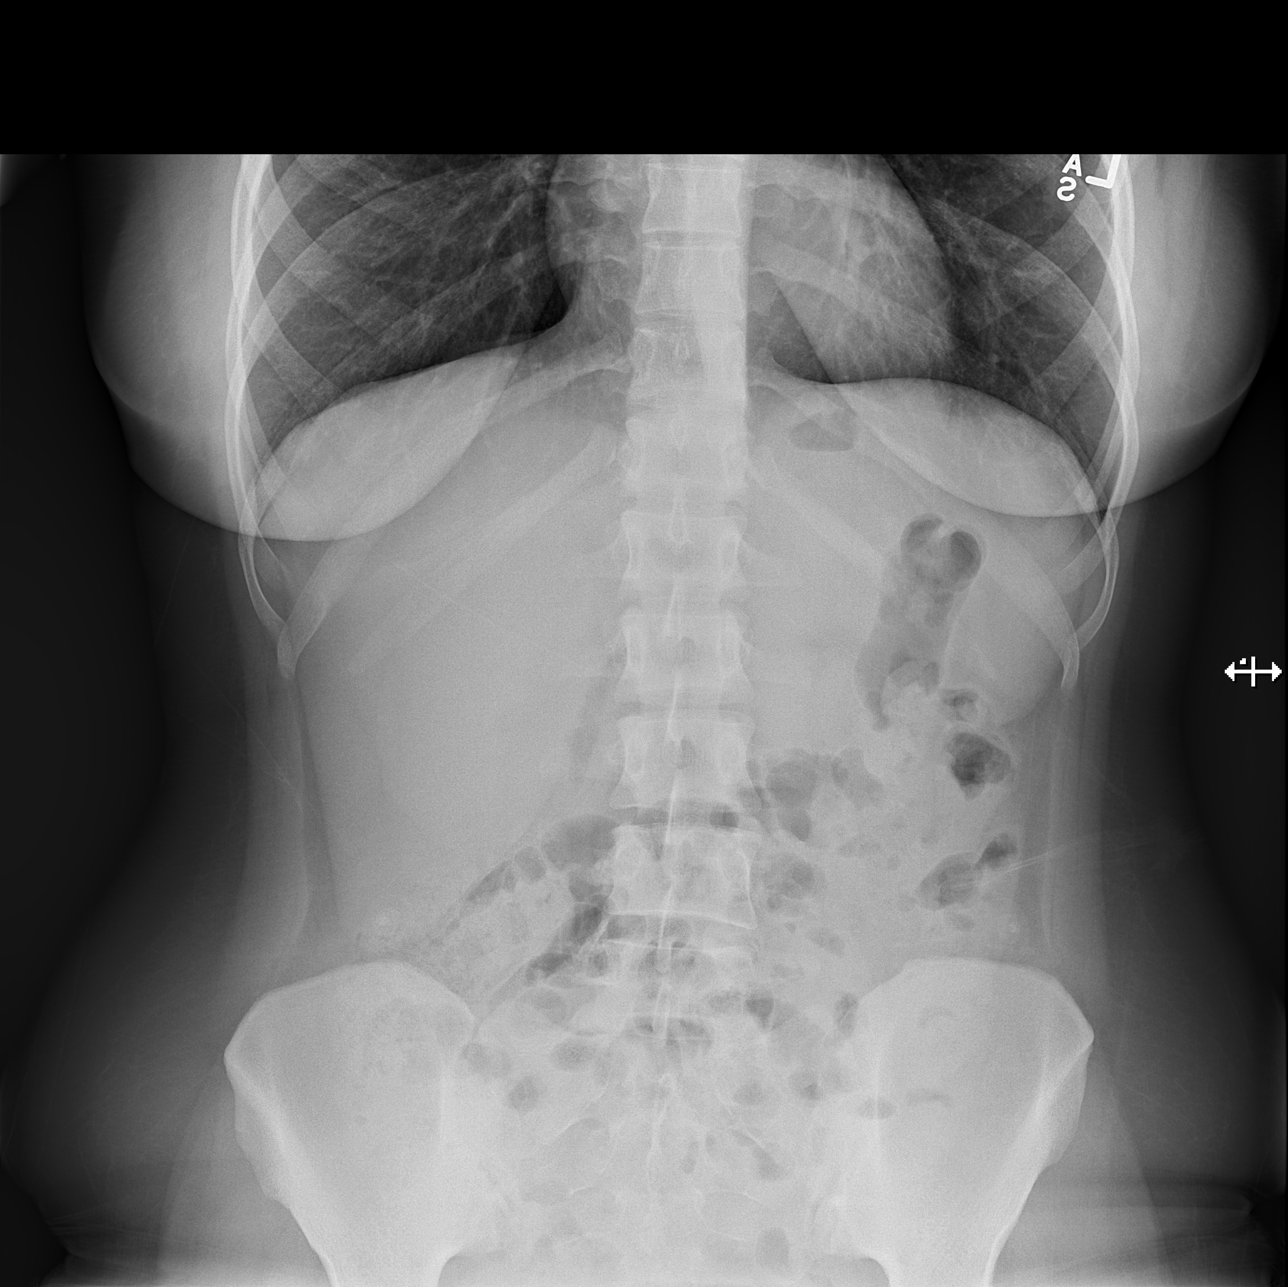

[t abdomen supine]
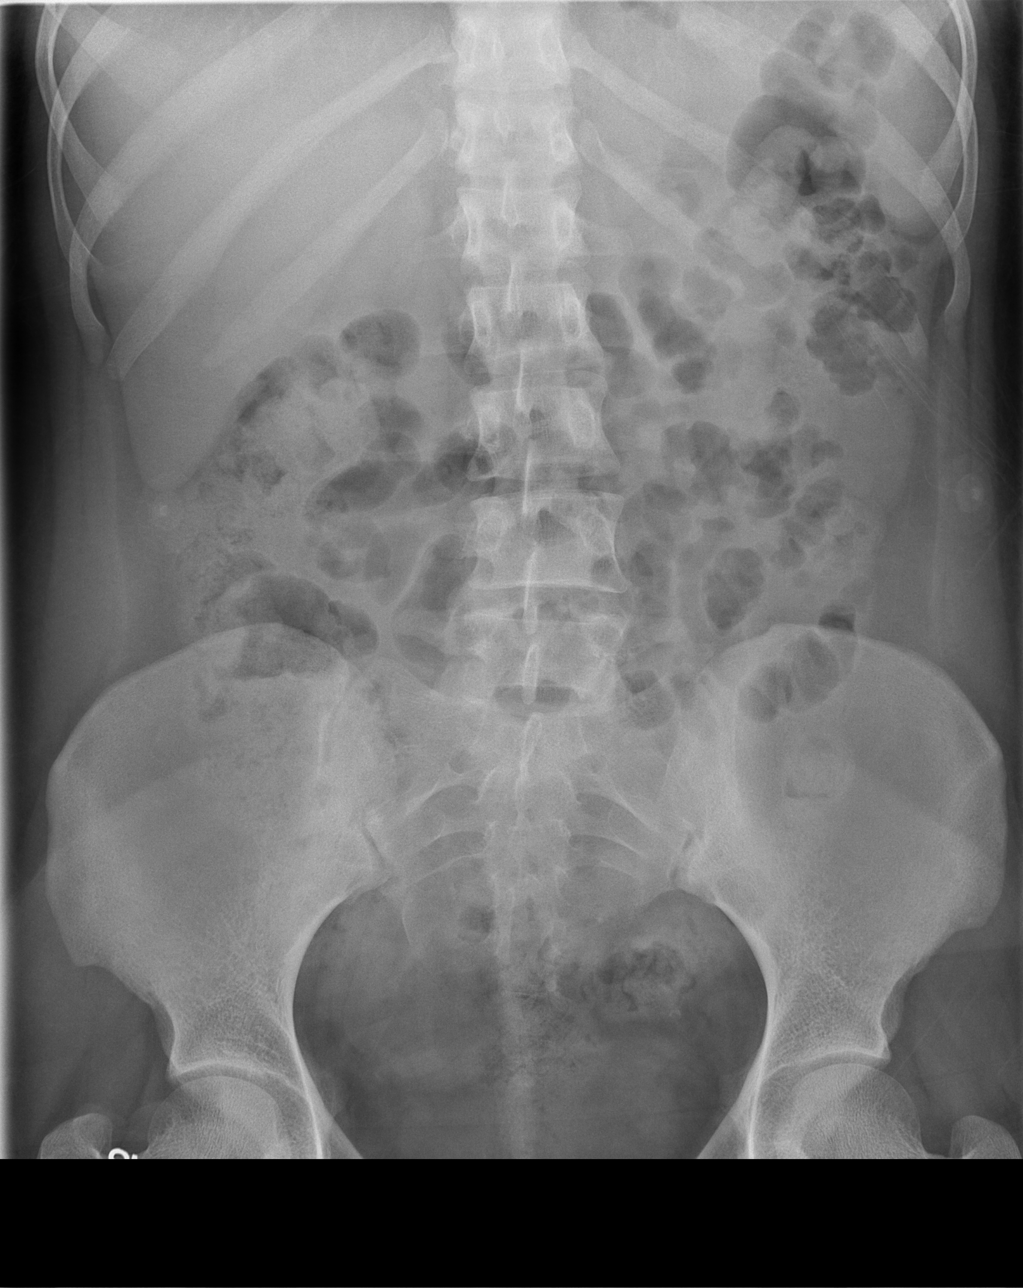

[3 of 3 positions shown; findings below may reference images not displayed]

FINDINGS: There is no evidence of dilated bowel loops or free intraperitoneal
air. No radiopaque calculi or other significant radiographic
abnormality is seen. Heart size and mediastinal contours are within
normal limits. Both lungs are clear.
IMPRESSION: Negative abdominal radiographs.  No acute cardiopulmonary disease.

## 2015-10-05 ENCOUNTER — Emergency Department (HOSPITAL_COMMUNITY)
Admission: EM | Admit: 2015-10-05 | Discharge: 2015-10-06 | Disposition: A | Discharge status: Home / Self Care | Payer: Self-pay | Attending: Emergency Medicine | Admitting: Emergency Medicine

## 2015-10-05 ENCOUNTER — Encounter (HOSPITAL_COMMUNITY): Payer: Self-pay

## 2015-10-05 DIAGNOSIS — R112 Nausea with vomiting, unspecified: Secondary | ICD-10-CM | POA: Insufficient documentation

## 2015-10-05 DIAGNOSIS — R51 Headache: Secondary | ICD-10-CM | POA: Insufficient documentation

## 2015-10-05 DIAGNOSIS — R519 Headache, unspecified: Secondary | ICD-10-CM

## 2015-10-05 DIAGNOSIS — Z79899 Other long term (current) drug therapy: Secondary | ICD-10-CM | POA: Insufficient documentation

## 2015-10-05 LAB — URINALYSIS, ROUTINE W REFLEX MICROSCOPIC
BILIRUBIN URINE: NEGATIVE
Glucose, UA: NEGATIVE mg/dL
HGB URINE DIPSTICK: NEGATIVE
Ketones, ur: NEGATIVE mg/dL
Nitrite: NEGATIVE
PH: 7.5 (ref 5.0–8.0)
Protein, ur: NEGATIVE mg/dL
SPECIFIC GRAVITY, URINE: 1.016 (ref 1.005–1.030)

## 2015-10-05 LAB — CBC
HEMATOCRIT: 45 % (ref 36.0–46.0)
Hemoglobin: 16.2 g/dL — ABNORMAL HIGH (ref 12.0–15.0)
MCH: 30.5 pg (ref 26.0–34.0)
MCHC: 36 g/dL (ref 30.0–36.0)
MCV: 84.7 fL (ref 78.0–100.0)
Platelets: 314 10*3/uL (ref 150–400)
RBC: 5.31 MIL/uL — ABNORMAL HIGH (ref 3.87–5.11)
RDW: 12 % (ref 11.5–15.5)
WBC: 12.9 10*3/uL — ABNORMAL HIGH (ref 4.0–10.5)

## 2015-10-05 LAB — URINE MICROSCOPIC-ADD ON: RBC / HPF: NONE SEEN RBC/hpf (ref 0–5)

## 2015-10-05 LAB — COMPREHENSIVE METABOLIC PANEL
ALBUMIN: 4.5 g/dL (ref 3.5–5.0)
ALT: 22 U/L (ref 14–54)
ANION GAP: 10 (ref 5–15)
AST: 24 U/L (ref 15–41)
Alkaline Phosphatase: 93 U/L (ref 38–126)
BUN: 6 mg/dL (ref 6–20)
CO2: 24 mmol/L (ref 22–32)
Calcium: 10.3 mg/dL (ref 8.9–10.3)
Chloride: 105 mmol/L (ref 101–111)
Creatinine, Ser: 0.65 mg/dL (ref 0.44–1.00)
GFR calc Af Amer: >60 mL/min (ref >60)
GFR calc non Af Amer: >60 mL/min (ref >60)
GLUCOSE: 92 mg/dL (ref 65–99)
POTASSIUM: 3.7 mmol/L (ref 3.5–5.1)
SODIUM: 139 mmol/L (ref 135–145)
Total Bilirubin: 0.7 mg/dL (ref 0.3–1.2)
Total Protein: 8.2 g/dL — ABNORMAL HIGH (ref 6.5–8.1)

## 2015-10-05 LAB — PREGNANCY, URINE: PREG TEST UR: NEGATIVE

## 2015-10-05 MED ORDER — ONDANSETRON 4 MG PO TBDP
4.0000 mg | ORAL_TABLET | Freq: Once | ORAL | Status: AC | PRN
  Administered 2015-10-05: 4 mg via ORAL

## 2015-10-05 MED ORDER — METOCLOPRAMIDE HCL 5 MG/ML IJ SOLN
10.0000 mg | Freq: Once | INTRAMUSCULAR | Status: AC
  Administered 2015-10-05: 10 mg via INTRAVENOUS
  Filled 2015-10-05: qty 2

## 2015-10-05 MED ORDER — ONDANSETRON 4 MG PO TBDP
ORAL_TABLET | ORAL | Status: AC
  Filled 2015-10-05: qty 1

## 2015-10-05 MED ORDER — HYDROCODONE-ACETAMINOPHEN 5-325 MG PO TABS
1.0000 | ORAL_TABLET | Freq: Once | ORAL | Status: AC
  Administered 2015-10-05: 1 via ORAL
  Filled 2015-10-05: qty 1

## 2015-10-05 MED ORDER — SODIUM CHLORIDE 0.9 % IV BOLUS (SEPSIS)
1000.0000 mL | Freq: Once | INTRAVENOUS | Status: AC
  Administered 2015-10-05: 1000 mL via INTRAVENOUS

## 2015-10-05 NOTE — Discharge Instructions (Signed)
It was our pleasure to provide your ER care today - we hope that you feel better.  Rest. Drink plenty of fluids.  Follow up with primary care doctor in 1 days time for recheck if symptoms fail to improve/resolve.  Return to ER right away if worse, new symptoms, fevers, worsening or severe abdominal pain, persistent vomiting, other concern.  You were given pain medication in the ER - no driving for the next 4 hours.   We did send a urine culture to test for urine infection - we will notify you if positive.  Return if fevers or urine symptoms develop, such as pain urinating.      Nausea and Vomiting Nausea is a sick feeling that often comes before throwing up (vomiting). Vomiting is a reflex where stomach contents come out of your mouth. Vomiting can cause severe loss of body fluids (dehydration). Children and elderly adults can become dehydrated quickly, especially if they also have diarrhea. Nausea and vomiting are symptoms of a condition or disease. It is important to find the cause of your symptoms. CAUSES   Direct irritation of the stomach lining. This irritation can result from increased acid production (gastroesophageal reflux disease), infection, food poisoning, taking certain medicines (such as nonsteroidal anti-inflammatory drugs), alcohol use, or tobacco use.  Signals from the brain.These signals could be caused by a headache, heat exposure, an inner ear disturbance, increased pressure in the brain from injury, infection, a tumor, or a concussion, pain, emotional stimulus, or metabolic problems.  An obstruction in the gastrointestinal tract (bowel obstruction).  Illnesses such as diabetes, hepatitis, gallbladder problems, appendicitis, kidney problems, cancer, sepsis, atypical symptoms of a heart attack, or eating disorders.  Medical treatments such as chemotherapy and radiation.  Receiving medicine that makes you sleep (general anesthetic) during surgery. DIAGNOSIS Your  caregiver may ask for tests to be done if the problems do not improve after a few days. Tests may also be done if symptoms are severe or if the reason for the nausea and vomiting is not clear. Tests may include:  Urine tests.  Blood tests.  Stool tests.  Cultures (to look for evidence of infection).  X-rays or other imaging studies. Test results can help your caregiver make decisions about treatment or the need for additional tests. TREATMENT You need to stay well hydrated. Drink frequently but in small amounts.You may wish to drink water, sports drinks, clear broth, or eat frozen ice pops or gelatin dessert to help stay hydrated.When you eat, eating slowly may help prevent nausea.There are also some antinausea medicines that may help prevent nausea. HOME CARE INSTRUCTIONS   Take all medicine as directed by your caregiver.  If you do not have an appetite, do not force yourself to eat. However, you must continue to drink fluids.  If you have an appetite, eat a normal diet unless your caregiver tells you differently.  Eat a variety of complex carbohydrates (rice, wheat, potatoes, bread), lean meats, yogurt, fruits, and vegetables.  Avoid high-fat foods because they are more difficult to digest.  Drink enough water and fluids to keep your urine clear or pale yellow.  If you are dehydrated, ask your caregiver for specific rehydration instructions. Signs of dehydration may include:  Severe thirst.  Dry lips and mouth.  Dizziness.  Dark urine.  Decreasing urine frequency and amount.  Confusion.  Rapid breathing or pulse. SEEK IMMEDIATE MEDICAL CARE IF:   You have blood or brown flecks (like coffee grounds) in your vomit.  You  have black or bloody stools.  You have a severe headache or stiff neck.  You are confused.  You have severe abdominal pain.  You have chest pain or trouble breathing.  You do not urinate at least once every 8 hours.  You develop cold or  clammy skin.  You continue to vomit for longer than 24 to 48 hours.  You have a fever. MAKE SURE YOU:   Understand these instructions.  Will watch your condition.  Will get help right away if you are not doing well or get worse.   This information is not intended to replace advice given to you by your health care provider. Make sure you discuss any questions you have with your health care provider.   Document Released: 03/09/2005 Document Revised: 06/01/2011 Document Reviewed: 08/06/2010 Elsevier Interactive Patient Education 2016 [employer].    Viral Gastroenteritis Viral gastroenteritis is also known as stomach flu. This condition affects the stomach and intestinal tract. It can cause sudden diarrhea and vomiting. The illness typically lasts 3 to 8 days. Most people develop an immune response that eventually gets rid of the virus. While this natural response develops, the virus can make you quite ill. CAUSES  Many different viruses can cause gastroenteritis, such as rotavirus or noroviruses. You can catch one of these viruses by consuming contaminated food or water. You may also catch a virus by sharing utensils or other personal items with an infected person or by touching a contaminated surface. SYMPTOMS  The most common symptoms are diarrhea and vomiting. These problems can cause a severe loss of body fluids (dehydration) and a body salt (electrolyte) imbalance. Other symptoms may include:  Fever.  Headache.  Fatigue.  Abdominal pain. DIAGNOSIS  Your caregiver can usually diagnose viral gastroenteritis based on your symptoms and a physical exam. A stool sample may also be taken to test for the presence of viruses or other infections. TREATMENT  This illness typically goes away on its own. Treatments are aimed at rehydration. The most serious cases of viral gastroenteritis involve vomiting so severely that you are not able to keep fluids down. In these cases, fluids must  be given through an intravenous line (IV). HOME CARE INSTRUCTIONS   Drink enough fluids to keep your urine clear or pale yellow. Drink small amounts of fluids frequently and increase the amounts as tolerated.  Ask your caregiver for specific rehydration instructions.  Avoid:  Foods high in sugar.  Alcohol.  Carbonated drinks.  Tobacco.  Juice.  Caffeine drinks.  Extremely hot or cold fluids.  Fatty, greasy foods.  Too much intake of anything at one time.  Dairy products until 24 to 48 hours after diarrhea stops.  You may consume probiotics. Probiotics are active cultures of beneficial bacteria. They may lessen the amount and number of diarrheal stools in adults. Probiotics can be found in yogurt with active cultures and in supplements.  Wash your hands well to avoid spreading the virus.  Only take over-the-counter or prescription medicines for pain, discomfort, or fever as directed by your caregiver. Do not give aspirin to children. Antidiarrheal medicines are not recommended.  Ask your caregiver if you should continue to take your regular prescribed and over-the-counter medicines.  Keep all follow-up appointments as directed by your caregiver. SEEK IMMEDIATE MEDICAL CARE IF:   You are unable to keep fluids down.  You do not urinate at least once every 6 to 8 hours.  You develop shortness of breath.  You notice blood in  your stool or vomit. This may look like coffee grounds.  You have abdominal pain that increases or is concentrated in one small area (localized).  You have persistent vomiting or diarrhea.  You have a fever.  The patient is a child younger than 3 months, and he or she has a fever.  The patient is a child older than 3 months, and he or she has a fever and persistent symptoms.  The patient is a child older than 3 months, and he or she has a fever and symptoms suddenly get worse.  The patient is a baby, and he or she has no tears when  crying. MAKE SURE YOU:   Understand these instructions.  Will watch your condition.  Will get help right away if you are not doing well or get worse.   This information is not intended to replace advice given to you by your health care provider. Make sure you discuss any questions you have with your health care provider.   Document Released: 03/09/2005 Document Revised: 06/01/2011 Document Reviewed: 12/24/2010 Elsevier Interactive Patient Education 2016 [employer]   General Headache Without Cause A headache is pain or discomfort felt around the head or neck area. The specific cause of a headache may not be found. There are many causes and types of headaches. A few common ones are:  Tension headaches.  Migraine headaches.  Cluster headaches.  Chronic daily headaches. HOME CARE INSTRUCTIONS  Watch your condition for any changes. Take these steps to help with your condition: Managing Pain  Take over-the-counter and prescription medicines only as told by your health care provider.  Lie down in a dark, quiet room when you have a headache.  If directed, apply ice to the head and neck area:  Put ice in a plastic bag.  Place a towel between your skin and the bag.  Leave the ice on for 20 minutes, 2-3 times per day.  Use a heating pad or hot shower to apply heat to the head and neck area as told by your health care provider.  Keep lights dim if bright lights bother you or make your headaches worse. Eating and Drinking  Eat meals on a regular schedule.  Limit alcohol use.  Decrease the amount of caffeine you drink, or stop drinking caffeine. General Instructions  Keep all follow-up visits as told by your health care provider. This is important.  Keep a headache journal to help find out what may trigger your headaches. For example, write down:  What you eat and drink.  How much sleep you get.  Any change to your diet or medicines.  Try massage or other  relaxation techniques.  Limit stress.  Sit up straight, and do not tense your muscles.  Do not use tobacco products, including cigarettes, chewing tobacco, or e-cigarettes. If you need help quitting, ask your health care provider.  Exercise regularly as told by your health care provider.  Sleep on a regular schedule. Get 7-9 hours of sleep, or the amount recommended by your health care provider. SEEK MEDICAL CARE IF:   Your symptoms are not helped by medicine.  You have a headache that is different from the usual headache.  You have nausea or you vomit.  You have a fever. SEEK IMMEDIATE MEDICAL CARE IF:   Your headache becomes severe.  You have repeated vomiting.  You have a stiff neck.  You have a loss of vision.  You have problems with speech.  You have pain in  the eye or ear.  You have muscular weakness or loss of muscle control.  You lose your balance or have trouble walking.  You feel faint or pass out.  You have confusion.   This information is not intended to replace advice given to you by your health care provider. Make sure you discuss any questions you have with your health care provider.   Document Released: 03/09/2005 Document Revised: 11/28/2014 Document Reviewed: 07/02/2014 Elsevier Interactive Patient Education [employer]

## 2015-10-05 NOTE — ED Notes (Signed)
Pt reports she woke up this morning with nausea, she has had 4 episodes of emesis. Pt reports she also has a migraine which has gotten worse throughout the day.

## 2015-10-05 NOTE — ED Provider Notes (Signed)
CSN: [id number]     Arrival date & time 10/05/15  1850 History   First MD Initiated Contact with Patient 10/05/15 2146     Chief Complaint  Patient presents with  . Emesis  . Migraine     (Consider location/radiation/quality/duration/timing/severity/associated sxs/prior Treatment) Patient is a 25 y.o. female presenting with vomiting and migraines. The history is provided by the patient.  Emesis Associated symptoms: diarrhea and headaches   Associated symptoms: no abdominal pain, no chills and no sore throat   Migraine Associated symptoms include headaches. Pertinent negatives include no chest pain, no abdominal pain and no shortness of breath.  Patient c/o onset nausea and vomiting this AM. Several episodes. Emesis clear or color ingested fluids, no bloody or bilious emesis. Subsequent onset diarrhea, loose to watery, a few episodes. No abd pain. Denies dysuria or urgency. No flank pain. No fever or chills. Denies vaginal bleeding or discharge. lnmp 2 weeks ago. Patient then noted gradual onset frontal headache. ?hx migraines. States headache c/w prior headaches. No change in speech or vision. No numbness/weakness, or change in normal functional ability. Denies neck pain or stiffness. No eye pain. Denies head injury. No syncope. No sinus drainage or congestion. w nvd symptoms, denies known ill contacts or bad food ingestion. No recent abx tx or new med use.      Past Medical History  Diagnosis Date  . ADHD (attention deficit hyperactivity disorder)   . Gallstones   . Panic attack 2016   Past Surgical History  Procedure Laterality Date  . Cholecystectomy N/A 10/15/2014    Procedure: LAPAROSCOPIC CHOLECYSTECTOMY WITH INTRAOPERATIVE CHOLANGIOGRAM;  Surgeon: Jimmye Norman, MD;  Location: [facility] OR;  Service: General;  Laterality: N/A;   Family History  Problem Relation Age of Onset  . Heart disease Maternal Grandmother   . Hypertension Maternal Grandmother   . Depression Maternal  Grandmother   . Arthritis Maternal Grandmother   . Depression Mother   . Cancer Mother   . Depression Sister   . Bladder Cancer Mother    Social History  Substance Use Topics  . Smoking status: Never Smoker   . Smokeless tobacco: Never Used  . Alcohol Use: No   OB History    No data available     Review of Systems  Constitutional: Negative for fever and chills.  HENT: Negative for sore throat.   Eyes: Negative for pain and visual disturbance.  Respiratory: Negative for cough and shortness of breath.   Cardiovascular: Negative for chest pain.  Gastrointestinal: Positive for vomiting and diarrhea. Negative for abdominal pain.  Genitourinary: Negative for dysuria, flank pain, vaginal bleeding and vaginal discharge.  Musculoskeletal: Negative for back pain, neck pain and neck stiffness.  Skin: Negative for rash.  Neurological: Positive for headaches. Negative for syncope, speech difficulty, weakness and numbness.  Hematological: Negative for adenopathy.  Psychiatric/Behavioral: Negative for confusion.      Allergies  Review of patient's allergies indicates no known allergies.  Home Medications   Prior to Admission medications   Medication Sig Start Date End Date Taking? Authorizing Provider  acetaminophen (TYLENOL) 325 MG tablet Take 650 mg by mouth every 6 (six) hours as needed for headache.    Historical Provider, MD  amitriptyline (ELAVIL) 25 MG tablet Take 1 tablet (25 mg total) by mouth at bedtime. 02/26/15   Ambrose Finland, NP  cetirizine (ZYRTEC) 10 MG tablet Take 1 tablet (10 mg total) by mouth daily. 07/30/14   Dessa Phi, MD  fluticasone (FLONASE) 50  MCG/ACT nasal spray Place 2 sprays into both nostrils daily. 10/11/14   Josalyn Funches, MD  omeprazole (PRILOSEC) 20 MG capsule Take 1 capsule (20 mg total) by mouth daily. Patient not taking: Reported on 02/26/2015 10/11/14   Dessa PhiJosalyn Funches, MD  oxyCODONE-acetaminophen (PERCOCET/ROXICET) 5-325 MG per tablet Take 1-2  tablets by mouth every 6 (six) hours as needed for moderate pain or severe pain. Patient not taking: Reported on 01/23/2015 10/16/14   Emina Riebock, NP   BP 129/82 mmHg  Pulse 88  Temp(Src) 98 F (36.7 C) (Oral)  Ht 5\' 2"  (1.575 m)  Wt 72.666 kg  BMI 29.29 kg/m2  SpO2 100%  LMP 09/21/2015 (Within Days) Physical Exam  Constitutional: She is oriented to person, place, and time. She appears well-developed and well-nourished. No distress.  HENT:  Head: Atraumatic.  Nose: Nose normal.  Mouth/Throat: Oropharynx is clear and moist.  No sinus or temporal tenderness.  Eyes: Conjunctivae and EOM are normal. Pupils are equal, round, and reactive to light. No scleral icterus.  Neck: Neck supple. No tracheal deviation present. No thyromegaly present.  No stiffness or rigidity.   Cardiovascular: Normal rate, regular rhythm, normal heart sounds and intact distal pulses.  Exam reveals no gallop and no friction rub.   No murmur heard. Pulmonary/Chest: Effort normal and breath sounds normal. No respiratory distress.  Abdominal: Soft. Normal appearance and bowel sounds are normal. She exhibits no distension and no mass. There is no tenderness. There is no rebound and no guarding.  Genitourinary:  No cva tenderness.  Musculoskeletal: Normal range of motion. She exhibits no edema or tenderness.  Neurological: She is alert and oriented to person, place, and time. No cranial nerve deficit.  Speech clear/fluent. Motor intact bilaterally. Steady gait.   Skin: Skin is warm and dry. No rash noted.  Psychiatric: She has a normal mood and affect.  Nursing note and vitals reviewed.   ED Course  Procedures (including critical care time) Labs Review   Results for orders placed or performed during the hospital encounter of 10/05/15  Comprehensive metabolic panel  Result Value Ref Range   Sodium 139 135 - 145 mmol/L   Potassium 3.7 3.5 - 5.1 mmol/L   Chloride 105 101 - 111 mmol/L   CO2 24 22 - 32 mmol/L    Glucose, Bld 92 65 - 99 mg/dL   BUN 6 6 - 20 mg/dL   Creatinine, Ser 4.090.65 0.44 - 1.00 mg/dL   Calcium 81.110.3 8.9 - 91.410.3 mg/dL   Total Protein 8.2 (H) 6.5 - 8.1 g/dL   Albumin 4.5 3.5 - 5.0 g/dL   AST 24 15 - 41 U/L   ALT 22 14 - 54 U/L   Alkaline Phosphatase 93 38 - 126 U/L   Total Bilirubin 0.7 0.3 - 1.2 mg/dL   GFR calc non Af Amer >60 >60 mL/min   GFR calc Af Amer >60 >60 mL/min   Anion gap 10 5 - 15  CBC  Result Value Ref Range   WBC 12.9 (H) 4.0 - 10.5 K/uL   RBC 5.31 (H) 3.87 - 5.11 MIL/uL   Hemoglobin 16.2 (H) 12.0 - 15.0 g/dL   HCT 78.245.0 95.636.0 - 21.346.0 %   MCV 84.7 78.0 - 100.0 fL   MCH 30.5 26.0 - 34.0 pg   MCHC 36.0 30.0 - 36.0 g/dL   RDW 08.612.0 57.811.5 - 46.915.5 %   Platelets 314 150 - 400 K/uL  Urinalysis, Routine w reflex microscopic  Result Value Ref Range  Color, Urine YELLOW YELLOW   APPearance CLOUDY (A) CLEAR   Specific Gravity, Urine 1.016 1.005 - 1.030   pH 7.5 5.0 - 8.0   Glucose, UA NEGATIVE NEGATIVE mg/dL   Hgb urine dipstick NEGATIVE NEGATIVE   Bilirubin Urine NEGATIVE NEGATIVE   Ketones, ur NEGATIVE NEGATIVE mg/dL   Protein, ur NEGATIVE NEGATIVE mg/dL   Nitrite NEGATIVE NEGATIVE   Leukocytes, UA LARGE (A) NEGATIVE  Urine microscopic-add on  Result Value Ref Range   Squamous Epithelial / LPF 6-30 (A) NONE SEEN   WBC, UA 6-30 0 - 5 WBC/hpf   RBC / HPF NONE SEEN 0 - 5 RBC/hpf   Bacteria, UA MANY (A) NONE SEEN   Urine-Other MUCOUS PRESENT   Pregnancy, urine  Result Value Ref Range   Preg Test, Ur NEGATIVE NEGATIVE      I have personally reviewed and evaluated these lab results as part of my medical decision-making.   MDM   Iv ns bolus. Labs. reglan iv.  Reviewed nursing notes and prior charts for additional history.   Recheck pt, denies dysuria, urgency, frequency, flank pain or specific gu c/o. abd is soft and non tender. No cva tenderness. Give epis on ua, and absence of dysuria/urinary symptoms, or cva tenderness, will culture urine, and hold  abx rx.   Headache improved but not resolved. No neck stiffness or rigidity. No recurrent emesis. Tolerating po fluids.  abd remains soft, nt.   Patient currently appears stable for d/c.       Cathren Laine, MD 10/05/15 2326

## 2015-10-06 NOTE — ED Notes (Signed)
Patient left at this time with all belongings. 

## 2015-10-07 LAB — URINE CULTURE

## 2015-10-20 ENCOUNTER — Emergency Department (HOSPITAL_COMMUNITY)
Admission: EM | Admit: 2015-10-20 | Discharge: 2015-10-20 | Disposition: A | Discharge status: Home / Self Care | Payer: Self-pay | Attending: Emergency Medicine | Admitting: Emergency Medicine

## 2015-10-20 ENCOUNTER — Encounter (HOSPITAL_COMMUNITY): Payer: Self-pay | Admitting: Emergency Medicine

## 2015-10-20 DIAGNOSIS — R110 Nausea: Secondary | ICD-10-CM | POA: Insufficient documentation

## 2015-10-20 DIAGNOSIS — Z79899 Other long term (current) drug therapy: Secondary | ICD-10-CM | POA: Insufficient documentation

## 2015-10-20 DIAGNOSIS — M545 Low back pain, unspecified: Secondary | ICD-10-CM

## 2015-10-20 LAB — URINALYSIS, ROUTINE W REFLEX MICROSCOPIC
BILIRUBIN URINE: NEGATIVE
Glucose, UA: NEGATIVE mg/dL
HGB URINE DIPSTICK: NEGATIVE
Ketones, ur: NEGATIVE mg/dL
Leukocytes, UA: NEGATIVE
NITRITE: NEGATIVE
PROTEIN: NEGATIVE mg/dL
Specific Gravity, Urine: 1.021 (ref 1.005–1.030)
pH: 5.5 (ref 5.0–8.0)

## 2015-10-20 LAB — PREGNANCY, URINE: PREG TEST UR: NEGATIVE

## 2015-10-20 LAB — I-STAT CHEM 8, ED
BUN: 6 mg/dL (ref 6–20)
CALCIUM ION: 1.23 mmol/L (ref 1.13–1.30)
Chloride: 107 mmol/L (ref 101–111)
Creatinine, Ser: 0.7 mg/dL (ref 0.44–1.00)
GLUCOSE: 84 mg/dL (ref 65–99)
HCT: 37 % (ref 36.0–46.0)
HEMOGLOBIN: 12.6 g/dL (ref 12.0–15.0)
POTASSIUM: 3.7 mmol/L (ref 3.5–5.1)
SODIUM: 142 mmol/L (ref 135–145)
TCO2: 24 mmol/L (ref 0–100)

## 2015-10-20 MED ORDER — IBUPROFEN 200 MG PO TABS
600.0000 mg | ORAL_TABLET | Freq: Once | ORAL | Status: AC
  Administered 2015-10-20: 600 mg via ORAL
  Filled 2015-10-20: qty 3

## 2015-10-20 NOTE — ED Notes (Signed)
MD at bedside to evaluate pt.

## 2015-10-20 NOTE — ED Triage Notes (Signed)
Patient c/o right sided back pain that radiates to mid back x 3 days. Patient states that pain is now constant and today when trying to eat got nauseated and not able to finish eating.  Patient denies vomiting.

## 2015-10-20 NOTE — ED Notes (Signed)
I made patient aware that we needed a urine sample however she stated she could not give one because she just went to the restroom.  I made the nurse aware

## 2015-10-20 NOTE — ED Provider Notes (Signed)
[facility]EMERG[facility]Y DEPT Provider Note   CSN: [id number] Arrival date & time: 10/20/15  1708  First Provider Contact:  First MD Initiated Contact with Patient 10/20/15 1904        History   Chief Complaint Chief Complaint  Patient presents with  . Back Pain  . Nausea    HPI Tanya Sanchez is a 25 y.o. female.  HPI Patient presents with concern of back pain, nausea. Symptoms began about 2 days ago, initially with right mid back pain. Subsequently the pain began to radiate to the midline. Then the patient had development of mild nausea, but she denies anorexia, vomiting, diarrhea, fever, chills. No medication taken for relief. Patient denies any urinary symptoms. She notes that she has been moving heavy objects at work frequently.  Past Medical History:  Diagnosis Date  . ADHD (attention deficit hyperactivity disorder)   . Gallstones   . Panic attack 2016    Patient Active Problem List   Diagnosis Date Noted  . Symptomatic cholelithiasis 10/14/2014  . Epigastric abdominal pain 10/11/2014  . Chronic tension headaches 10/11/2014  . Vitamin D deficiency 07/31/2014  . Panic attacks 07/30/2014  . Poor dentition 07/30/2014  . Allergic rhinitis 07/30/2014    Past Surgical History:  Procedure Laterality Date  . CHOLECYSTECTOMY N/A 10/15/2014   Procedure: LAPAROSCOPIC CHOLECYSTECTOMY WITH INTRAOPERATIVE CHOLANGIOGRAM;  Surgeon: Jimmye Norman, MD;  Location: [facility] OR;  Service: General;  Laterality: N/A;    OB History    No data available       Home Medications    Prior to Admission medications   Medication Sig Start Date End Date Taking? Authorizing Provider  acetaminophen (TYLENOL) 325 MG tablet Take 650 mg by mouth every 6 (six) hours as needed for headache.    Historical Provider, MD  amitriptyline (ELAVIL) 25 MG tablet Take 1 tablet (25 mg total) by mouth at bedtime. Patient not taking: Reported on 10/05/2015 02/26/15   Ambrose Finland, NP  cetirizine (ZYRTEC) 10 MG  tablet Take 1 tablet (10 mg total) by mouth daily. Patient not taking: Reported on 10/05/2015 07/30/14   Dessa Phi, MD  fluticasone (FLONASE) 50 MCG/ACT nasal spray Place 2 sprays into both nostrils daily. Patient not taking: Reported on 10/05/2015 10/11/14   Dessa Phi, MD  omeprazole (PRILOSEC) 20 MG capsule Take 1 capsule (20 mg total) by mouth daily. Patient not taking: Reported on 02/26/2015 10/11/14   Dessa Phi, MD  oxyCODONE-acetaminophen (PERCOCET/ROXICET) 5-325 MG per tablet Take 1-2 tablets by mouth every 6 (six) hours as needed for moderate pain or severe pain. Patient not taking: Reported on 01/23/2015 10/16/14   Ashok Norris, NP    Family History Family History  Problem Relation Age of Onset  . Heart disease Maternal Grandmother   . Hypertension Maternal Grandmother   . Depression Maternal Grandmother   . Arthritis Maternal Grandmother   . Depression Mother   . Cancer Mother   . Bladder Cancer Mother   . Depression Sister     Social History Social History  Substance Use Topics  . Smoking status: Never Smoker  . Smokeless tobacco: Never Used  . Alcohol use No     Allergies   Review of patient's allergies indicates no known allergies.   Review of Systems Review of Systems  Constitutional:       Per HPI, otherwise negative  HENT:       Per HPI, otherwise negative  Respiratory:       Per HPI, otherwise negative  Cardiovascular:       Per HPI, otherwise negative  Gastrointestinal: Negative for vomiting.  Endocrine:       Negative aside from HPI  Genitourinary:       Neg aside from HPI   Musculoskeletal:       Per HPI, otherwise negative  Skin: Negative.   Neurological: Negative for syncope.     Physical Exam Updated Vital Signs BP 140/89 (BP Location: Left Arm)   Pulse 81   Temp 98.9 F (37.2 C) (Oral)   Resp 17   LMP 09/21/2015 (Within Days)   SpO2 100%   Physical Exam  Constitutional: She is oriented to person, place, and time.  She appears well-developed and well-nourished. No distress.  HENT:  Head: Normocephalic and atraumatic.  Eyes: Conjunctivae and EOM are normal.  Cardiovascular: Normal rate and regular rhythm.   Pulmonary/Chest: Effort normal and breath sounds normal. No stridor. No respiratory distress.  Abdominal: She exhibits no distension.  Musculoskeletal: She exhibits no edema.  Neurological: She is alert and oriented to person, place, and time. No cranial nerve deficit. Coordination normal.  Skin: Skin is warm and dry.  Psychiatric: She has a normal mood and affect.  Nursing note and vitals reviewed.    ED Treatments / Results  Labs (all labs ordered are listed, but only abnormal results are displayed) Labs Reviewed  URINALYSIS, ROUTINE W REFLEX MICROSCOPIC (NOT AT [facility]) - Abnormal; Notable for the following:       Result Value   APPearance HAZY (*)    All other components within normal limits  PREGNANCY, URINE  I-STAT CHEM 8, ED   Procedures Procedures (including critical care time)  Medications Ordered in ED Medications  ibuprofen (ADVIL,MOTRIN) tablet 600 mg (600 mg Oral Given 10/20/15 2005)     Initial Impression / Assessment and Plan / ED Course  I have reviewed the triage vital signs and the nursing notes.  Pertinent labs & imaging results that were available during my care of the patient were reviewed by me and considered in my medical decision making (see chart for details).  Clinical Course   Patient in no distress on repeat exam, aware of all findings. Patient will start analgesia, follow up with primary care  Final Clinical Impressions(s) / ED Diagnoses   Final diagnoses:  Bilateral low back pain without sciatica  Nausea   Patient presents with back pain. Here evaluation is reassuring, no evidence for neurologic dysfunction, no evidence for urinary tract infection, no abdominal pain or evidence for hepatobiliary dysfunction. Patient discharged in stable  condition.   Gerhard Munch, MD 10/20/15 2040

## 2015-10-20 NOTE — Discharge Instructions (Signed)
As discussed, your evaluation today has been largely reassuring.  But, it is important that you monitor your condition carefully, and do not hesitate to return to the ED if you develop new, or concerning changes in your condition.  Otherwise, please follow-up with your physician for appropriate ongoing care.  For the next 3 days please use ibuprofen, 600 mg, 3 times daily.

## 2015-12-09 ENCOUNTER — Ambulatory Visit: Payer: Self-pay | Admitting: Family Medicine

## 2015-12-16 ENCOUNTER — Encounter (HOSPITAL_COMMUNITY): Payer: Self-pay | Admitting: Family Medicine

## 2015-12-16 ENCOUNTER — Ambulatory Visit (HOSPITAL_COMMUNITY)
Admission: EM | Admit: 2015-12-16 | Discharge: 2015-12-16 | Disposition: A | Discharge status: Home / Self Care | Payer: Self-pay | Attending: Family Medicine | Admitting: Family Medicine

## 2015-12-16 DIAGNOSIS — T161XXA Foreign body in right ear, initial encounter: Secondary | ICD-10-CM

## 2015-12-16 NOTE — ED Triage Notes (Signed)
Patient woke with sensation of something in right ear and has intermittently felt something crawling

## 2015-12-16 NOTE — ED Provider Notes (Addendum)
[facility]    CSN: [id number] Arrival date & time: 12/16/15  1035  First Provider Contact:  First MD Initiated Contact with Patient 12/16/15 1234        History   Chief Complaint Chief Complaint  Patient presents with  . Foreign Body in Ear    HPI Tanya Sanchez is a 25 y.o. female.   25 yo with complaint of foreign body in ear. She thinks a bug crawled into her right ear this morning. It's been relatively uncomfortable with a sensation of something is moving around inside.      Past Medical History:  Diagnosis Date  . ADHD (attention deficit hyperactivity disorder)   . Gallstones   . Panic attack 2016    Patient Active Problem List   Diagnosis Date Noted  . Symptomatic cholelithiasis 10/14/2014  . Epigastric abdominal pain 10/11/2014  . Chronic tension headaches 10/11/2014  . Vitamin D deficiency 07/31/2014  . Panic attacks 07/30/2014  . Poor dentition 07/30/2014  . Allergic rhinitis 07/30/2014    Past Surgical History:  Procedure Laterality Date  . CHOLECYSTECTOMY N/A 10/15/2014   Procedure: LAPAROSCOPIC CHOLECYSTECTOMY WITH INTRAOPERATIVE CHOLANGIOGRAM;  Surgeon: Jimmye NormanJames Wyatt, MD;  Location: [facility] OR;  Service: General;  Laterality: N/A;    OB History    No data available       Home Medications    Prior to Admission medications   Medication Sig Start Date End Date Taking? Authorizing Provider  acetaminophen (TYLENOL) 325 MG tablet Take 650 mg by mouth every 6 (six) hours as needed for headache.    Historical Provider, MD  amitriptyline (ELAVIL) 25 MG tablet Take 1 tablet (25 mg total) by mouth at bedtime. Patient not taking: Reported on 12/16/2015 02/26/15   Ambrose FinlandValerie A Keck, NP    Family History Family History  Problem Relation Age of Onset  . Heart disease Maternal Grandmother   . Hypertension Maternal Grandmother   . Depression Maternal Grandmother   . Arthritis Maternal Grandmother   . Depression Mother   . Cancer Mother   .  Bladder Cancer Mother   . Depression Sister     Social History Social History  Substance Use Topics  . Smoking status: Never Smoker  . Smokeless tobacco: Never Used  . Alcohol use No     Allergies   Review of patient's allergies indicates no known allergies.   Review of Systems Review of Systems  Constitutional: Negative.   HENT: Positive for ear pain.   Eyes: Negative.      Physical Exam Triage Vital Signs ED Triage Vitals [12/16/15 1224]  Enc Vitals Group     BP 126/84     Pulse Rate 88     Resp 16     Temp 98 F (36.7 C)     Temp Source Oral     SpO2 100 %     Weight      Height      Head Circumference      Peak Flow      Pain Score      Pain Loc      Pain Edu?      Excl. in GC?    No data found.   Updated Vital Signs BP 126/84 (BP Location: Right Arm)   Pulse 88   Temp 98 F (36.7 C) (Oral)   Resp 16   SpO2 100%      Physical Exam  Constitutional: She appears well-developed and well-nourished.  HENT:  Head:  Normocephalic.  Round foreign body noted in the right ear canal  Eyes: Conjunctivae and EOM are normal. Pupils are equal, round, and reactive to light.  Nursing note and vitals reviewed.    UC Treatments / Results  Labs (all labs ordered are listed, but only abnormal results are displayed) Labs Reviewed - No data to display  EKG  EKG Interpretation None       Radiology No results found.  Procedures Procedures (including critical care time)  Medications Ordered in UC Medications - No data to display   Initial Impression / Assessment and Plan / UC Course  I have reviewed the triage vital signs and the nursing notes.  Pertinent labs & imaging results that were available during my care of the patient were reviewed by me and considered in my medical decision making (see chart for details).  Clinical Course   Ear was irrigated multiple times after lidocaine was instilled. A roach was identified.   Final Clinical  Impressions(s) / UC Diagnoses   Final diagnoses:  Foreign body in right ear, initial encounter    New Prescriptions Current Discharge Medication List    Patient told to be patient and that the remains of the world will come out.   Elvina Sidle, MD 12/16/15 1303    Elvina Sidle, MD 12/16/15 [phone]

## 2015-12-16 NOTE — Discharge Instructions (Addendum)
The approach that we found in your ear is now dead. We removed some of it but part of the body still remains. It will come out gradually with the ear wax.

## 2016-01-22 ENCOUNTER — Emergency Department (HOSPITAL_COMMUNITY)
Admission: EM | Admit: 2016-01-22 | Discharge: 2016-01-22 | Disposition: A | Discharge status: Home / Self Care | Payer: Self-pay | Attending: Emergency Medicine | Admitting: Emergency Medicine

## 2016-01-22 ENCOUNTER — Encounter (HOSPITAL_COMMUNITY): Payer: Self-pay | Admitting: *Deleted

## 2016-01-22 DIAGNOSIS — Y939 Activity, unspecified: Secondary | ICD-10-CM | POA: Insufficient documentation

## 2016-01-22 DIAGNOSIS — Y929 Unspecified place or not applicable: Secondary | ICD-10-CM | POA: Insufficient documentation

## 2016-01-22 DIAGNOSIS — F909 Attention-deficit hyperactivity disorder, unspecified type: Secondary | ICD-10-CM | POA: Insufficient documentation

## 2016-01-22 DIAGNOSIS — Y999 Unspecified external cause status: Secondary | ICD-10-CM | POA: Insufficient documentation

## 2016-01-22 DIAGNOSIS — W19XXXA Unspecified fall, initial encounter: Secondary | ICD-10-CM | POA: Insufficient documentation

## 2016-01-22 DIAGNOSIS — S025XXA Fracture of tooth (traumatic), initial encounter for closed fracture: Secondary | ICD-10-CM | POA: Insufficient documentation

## 2016-01-22 NOTE — ED Triage Notes (Signed)
Pt reports having multiple broken teeth and pain, causing her to have increase migraines. occ nausea, no vomiting.

## 2016-01-22 NOTE — Discharge Instructions (Signed)
As discussed, with your broken teeth is reportedly scheduled follow-up with a dentist. Please call our dentist on call today to arrange follow-up. Please inform him that you're seen in the emergency department, require follow-up care.  If this is unsuccessful, please use the provided resources to obtain other dental follow-up.

## 2016-01-22 NOTE — ED Provider Notes (Signed)
[facility] Provider Note   CSN: [id number] Arrival date & time: 01/22/16  1519     History   Chief Complaint Chief Complaint  Patient presents with  . Migraine  . Dental Pain    HPI Ricci BarkerKeshia Bogie is a 25 y.o. female.  HPI  Patient presents with concern of headache, dental pain. Patient notes that she is a history of migraines, and after having a fall 3 weeks ago resulting in broken teeth, she has had persistent headache. No new weakness anywhere confusion, disorientation, vision changes. Patient has been seen 1 emergency department, but has not seen a dentist in the 3 weeks since her fall. She did complete one course of antibiotics after an initial evaluation, but is currently taking no medication for pain relief.  Past Medical History:  Diagnosis Date  . ADHD (attention deficit hyperactivity disorder)   . Gallstones   . Panic attack 2016    Patient Active Problem List   Diagnosis Date Noted  . Symptomatic cholelithiasis 10/14/2014  . Epigastric abdominal pain 10/11/2014  . Chronic tension headaches 10/11/2014  . Vitamin D deficiency 07/31/2014  . Panic attacks 07/30/2014  . Poor dentition 07/30/2014  . Allergic rhinitis 07/30/2014    Past Surgical History:  Procedure Laterality Date  . CHOLECYSTECTOMY N/A 10/15/2014   Procedure: LAPAROSCOPIC CHOLECYSTECTOMY WITH INTRAOPERATIVE CHOLANGIOGRAM;  Surgeon: Jimmye NormanJames Wyatt, MD;  Location: [facility] OR;  Service: General;  Laterality: N/A;    OB History    No data available       Home Medications    Prior to Admission medications   Medication Sig Start Date End Date Taking? Authorizing Provider  acetaminophen (TYLENOL) 325 MG tablet Take 650 mg by mouth every 6 (six) hours as needed for headache.    Historical Provider, MD  amitriptyline (ELAVIL) 25 MG tablet Take 1 tablet (25 mg total) by mouth at bedtime. Patient not taking: Reported on 12/16/2015 02/26/15   Ambrose FinlandValerie A Keck, NP    Family History Family  History  Problem Relation Age of Onset  . Heart disease Maternal Grandmother   . Hypertension Maternal Grandmother   . Depression Maternal Grandmother   . Arthritis Maternal Grandmother   . Depression Mother   . Cancer Mother   . Bladder Cancer Mother   . Depression Sister     Social History Social History  Substance Use Topics  . Smoking status: Never Smoker  . Smokeless tobacco: Never Used  . Alcohol use No     Allergies   Review of patient's allergies indicates no known allergies.   Review of Systems Review of Systems  Constitutional: Negative for fever.  Respiratory: Negative for shortness of breath.   Cardiovascular: Negative for chest pain.  Musculoskeletal:       Negative aside from HPI  Skin:       Negative aside from HPI  Allergic/Immunologic: Negative for immunocompromised state.  Neurological: Negative for weakness.     Physical Exam Updated Vital Signs BP 140/87 (BP Location: Right Arm)   Pulse 77   Temp 98.4 F (36.9 C) (Oral)   Resp 18   SpO2 98%   Physical Exam  Constitutional: She is oriented to person, place, and time. She appears well-developed and well-nourished. No distress.  HENT:  Head: Normocephalic and atraumatic.  Mouth/Throat:    Eyes: Conjunctivae and EOM are normal.  Pulmonary/Chest: Effort normal. No stridor. No respiratory distress.  Abdominal: She exhibits no distension.  Musculoskeletal: She exhibits no edema.  Neurological: She is  alert and oriented to person, place, and time. No cranial nerve deficit.  Skin: Skin is warm and dry.  Psychiatric: She has a normal mood and affect.  Nursing note and vitals reviewed.    ED Treatments / Results  Labs (all labs ordered are listed, but only abnormal results are displayed) Labs Reviewed - No data to display   Procedures Procedures (including critical care time)  Medications Ordered in ED Medications - No data to display   Initial Impression / Assessment and Plan /  ED Course  I have reviewed the triage vital signs and the nursing notes.  Pertinent labs & imaging results that were available during my care of the patient were reviewed by me and considered in my medical decision making (see chart for details).  Clinical Course     Female presents with ongoing pain in an area of broken teeth. This occurred several weeks ago, and today there is no evidence for dental infection, no evidence for decompensated state, no ongoing neurologic complaints, concerning for new intracranial pathology. With her history of migraine, broken teeth, she was appropriate for discharge, and she'll provided multiple resources to follow-up with dentist.  Final Clinical Impressions(s) / ED Diagnoses   Final diagnoses:  Closed fracture of tooth, initial encounter    New Prescriptions New Prescriptions   No medications on file     Gerhard Munchobert Jeryl Umholtz, MD 01/22/16 1714

## 2016-01-28 ENCOUNTER — Telehealth (HOSPITAL_BASED_OUTPATIENT_CLINIC_OR_DEPARTMENT_OTHER): Payer: Self-pay | Admitting: Emergency Medicine

## 2016-02-05 ENCOUNTER — Emergency Department (HOSPITAL_COMMUNITY): Payer: Self-pay

## 2016-02-05 ENCOUNTER — Encounter (HOSPITAL_COMMUNITY): Payer: Self-pay | Admitting: *Deleted

## 2016-02-05 ENCOUNTER — Emergency Department (HOSPITAL_COMMUNITY)
Admission: EM | Admit: 2016-02-05 | Discharge: 2016-02-05 | Disposition: A | Discharge status: Home / Self Care | Payer: Self-pay | Attending: Emergency Medicine | Admitting: Emergency Medicine

## 2016-02-05 DIAGNOSIS — F909 Attention-deficit hyperactivity disorder, unspecified type: Secondary | ICD-10-CM | POA: Insufficient documentation

## 2016-02-05 DIAGNOSIS — J069 Acute upper respiratory infection, unspecified: Secondary | ICD-10-CM | POA: Insufficient documentation

## 2016-02-05 DIAGNOSIS — B9789 Other viral agents as the cause of diseases classified elsewhere: Secondary | ICD-10-CM

## 2016-02-05 DIAGNOSIS — R0789 Other chest pain: Secondary | ICD-10-CM | POA: Insufficient documentation

## 2016-02-05 DIAGNOSIS — Z79899 Other long term (current) drug therapy: Secondary | ICD-10-CM | POA: Insufficient documentation

## 2016-02-05 MED ORDER — PSEUDOEPHEDRINE HCL 30 MG PO TABS: 30.0000 mg | ORAL_TABLET | Freq: Four times a day (QID) | ORAL | 0 refills | Status: DC | PRN

## 2016-02-05 MED ORDER — BENZONATATE 100 MG PO CAPS: 100.0000 mg | ORAL_CAPSULE | Freq: Three times a day (TID) | ORAL | 0 refills | Status: DC

## 2016-02-05 MED ORDER — IPRATROPIUM-ALBUTEROL 0.5-2.5 (3) MG/3ML IN SOLN
3.0000 mL | Freq: Once | RESPIRATORY_TRACT | Status: AC
  Administered 2016-02-05: 3 mL via RESPIRATORY_TRACT
  Filled 2016-02-05: qty 3

## 2016-02-05 NOTE — Discharge Instructions (Signed)
Be sure you are drinking plenty of fluids, use the vaporizer and take the medications as directed.

## 2016-02-05 NOTE — ED Notes (Signed)
Declined W/C at D/C and was escorted to lobby by RN. 

## 2016-02-05 NOTE — ED Provider Notes (Signed)
[facility] Provider Note   CSN: [id number] Arrival date & time: 02/05/16  1545  By signing my name below, I, Tanya Sanchez, attest that this documentation has been prepared under the direction and in the presence of Kerrie BuffaloHope Shaheim Mahar, NP. Electronically Signed: Angelene GiovanniEmmanuella Sanchez, ED Scribe. 02/05/16. 4:32 PM.   History   Chief Complaint Chief Complaint  Patient presents with  . Cough    HPI Comments: Tanya Sanchez is a 25 y.o. female with a hx of allergic rhinitis who presents to the Emergency Department complaining of gradually worsening persistent moderate non-productive cough onset 3-4 days ago. She reports associated chills, chest wall pain due to the cough, back pain, and mild generalized headache. She adds that she began experiencing intermittent SOB since this morning. No alleviating factors noted. Pt has not tried any medications PTA. She has NKDA. She explains that she has a hx of Bronchitis but denies any inhaler use. She also denies any fever, nausea, vomiting, ear pain, congestion, urinary symptoms, or any other symptoms.   The history is provided by the patient. No language interpreter was used.  Cough  This is a new problem. The current episode started more than 2 days ago. The problem has been gradually worsening. The cough is non-productive. There has been no fever. Associated symptoms include chills, headaches and shortness of breath. Pertinent negatives include no ear pain. She has tried nothing for the symptoms. She is not a smoker. Her past medical history is significant for bronchitis.    Past Medical History:  Diagnosis Date  . ADHD (attention deficit hyperactivity disorder)   . Gallstones   . Panic attack 2016    Patient Active Problem List   Diagnosis Date Noted  . Symptomatic cholelithiasis 10/14/2014  . Epigastric abdominal pain 10/11/2014  . Chronic tension headaches 10/11/2014  . Vitamin D deficiency 07/31/2014  . Panic attacks 07/30/2014    . Poor dentition 07/30/2014  . Allergic rhinitis 07/30/2014    Past Surgical History:  Procedure Laterality Date  . CHOLECYSTECTOMY N/A 10/15/2014   Procedure: LAPAROSCOPIC CHOLECYSTECTOMY WITH INTRAOPERATIVE CHOLANGIOGRAM;  Surgeon: Jimmye NormanJames Wyatt, MD;  Location: [facility] OR;  Service: General;  Laterality: N/A;    OB History    No data available       Home Medications    Prior to Admission medications   Medication Sig Start Date End Date Taking? Authorizing Provider  acetaminophen (TYLENOL) 325 MG tablet Take 650 mg by mouth every 6 (six) hours as needed for headache.    Historical Provider, MD  amitriptyline (ELAVIL) 25 MG tablet Take 1 tablet (25 mg total) by mouth at bedtime. Patient not taking: Reported on 12/16/2015 02/26/15   Ambrose FinlandValerie A Keck, NP  benzonatate (TESSALON) 100 MG capsule Take 1 capsule (100 mg total) by mouth every 8 (eight) hours. 02/05/16   Maurica Omura Orlene OchM Enes Wegener, NP  pseudoephedrine (SUDAFED) 30 MG tablet Take 1 tablet (30 mg total) by mouth every 6 (six) hours as needed for congestion. 02/05/16   Azarius Lambson Orlene OchM Newell Frater, NP    Family History Family History  Problem Relation Age of Onset  . Heart disease Maternal Grandmother   . Hypertension Maternal Grandmother   . Depression Maternal Grandmother   . Arthritis Maternal Grandmother   . Depression Mother   . Cancer Mother   . Bladder Cancer Mother   . Depression Sister     Social History Social History  Substance Use Topics  . Smoking status: Never Smoker  . Smokeless tobacco: Never Used  .  Alcohol use No     Allergies   Patient has no known allergies.   Review of Systems Review of Systems  Constitutional: Positive for chills. Negative for fever.  HENT: Negative for congestion and ear pain.   Respiratory: Positive for cough and shortness of breath.   Gastrointestinal: Negative for nausea and vomiting.  Genitourinary: Negative for dysuria and hematuria.  Musculoskeletal: Positive for back pain.  Neurological:  Positive for headaches.     Physical Exam Updated Vital Signs BP 133/83 (BP Location: Right Arm)   Pulse 86   Temp 98 F (36.7 C) (Oral)   Resp 18   LMP 01/22/2016   SpO2 100%   Physical Exam  Constitutional: She is oriented to person, place, and time. She appears well-developed and well-nourished. No distress.  HENT:  Head: Normocephalic and atraumatic.  Right Ear: Tympanic membrane normal.  Left Ear: Tympanic membrane normal.  Mouth/Throat: Uvula is midline and oropharynx is clear and moist. No posterior oropharyngeal edema or posterior oropharyngeal erythema.  Eyes: Conjunctivae and EOM are normal. Pupils are equal, round, and reactive to light.  Sclera clear  Neck: Neck supple. No tracheal deviation present.  No meningeal sign  Cardiovascular: Normal rate.   Pulmonary/Chest: Effort normal. No respiratory distress. She exhibits tenderness.  Decreased breath sounds in the lower lung fields No rales, occasional wheeze Mild chest wall tenderness  Musculoskeletal: Normal range of motion.  Neurological: She is alert and oriented to person, place, and time.  Skin: Skin is warm and dry.  Psychiatric: She has a normal mood and affect. Her behavior is normal.  Nursing note and vitals reviewed.    ED Treatments / Results  DIAGNOSTIC STUDIES: Oxygen Saturation is 100% on RA, normal by my interpretation.    COORDINATION OF CARE: 4:15 PM- Pt advised of plan for treatment and pt agrees. Pt will receive chest x-ray for further evaluation. She will also receive duo neb.    Labs (all labs ordered are listed, but only abnormal results are displayed) Labs Reviewed - No data to display  Radiology Dg Chest 2 View  Result Date: 02/05/2016 CLINICAL DATA:  Cough, chest pain. EXAM: CHEST  2 VIEW COMPARISON:  Radiographs of November 22, 2013. FINDINGS: The heart size and mediastinal contours are within normal limits. Both lungs are clear. No pneumothorax or pleural effusion is noted.  The visualized skeletal structures are unremarkable. IMPRESSION: No active cardiopulmonary disease. Electronically Signed   By: Lupita RaiderJames  Green Jr, M.D.   On: 02/05/2016 16:53    Procedures Procedures (including critical care time)  Medications Ordered in ED Medications  ipratropium-albuterol (DUONEB) 0.5-2.5 (3) MG/3ML nebulizer solution 3 mL (3 mLs Nebulization Given 02/05/16 1701)     Initial Impression / Assessment and Plan / ED Course  Kerrie BuffaloHope Omran Keelin, NP has reviewed the triage vital signs and the nursing notes.  Pertinent imaging results that were available during my care of the patient were reviewed by me and considered in my medical decision making (see chart for details).  Clinical Course    Pt CXR negative for acute infiltrate. Patients symptoms are consistent with URI, likely viral etiology. Discussed that antibiotics are not indicated for viral infections. Pt will be discharged with symptomatic treatment.  Verbalizes understanding and is agreeable with plan. Pt is hemodynamically stable & in NAD prior to dc.   Final Clinical Impressions(s) / ED Diagnoses   Final diagnoses:  Viral URI with cough    New Prescriptions Discharge Medication List as of 02/05/2016  5:05 PM    START taking these medications   Details  benzonatate (TESSALON) 100 MG capsule Take 1 capsule (100 mg total) by mouth every 8 (eight) hours., Starting Wed 02/05/2016, Print    pseudoephedrine (SUDAFED) 30 MG tablet Take 1 tablet (30 mg total) by mouth every 6 (six) hours as needed for congestion., Starting Wed 02/05/2016, Print       I personally performed the services described in this documentation, which was scribed in my presence. The recorded information has been reviewed and is accurate.    [place], NP 02/06/16 1903    Lyndal Pulley, MD 02/06/16 [phone]

## 2016-02-05 NOTE — ED Triage Notes (Signed)
Pt reports having non productive cough x 3-4 days, causing pain when coughing/breathing and now has chills. spo2 100% at triage and no acute distress noted.

## 2016-04-27 ENCOUNTER — Encounter (HOSPITAL_COMMUNITY): Payer: Self-pay

## 2016-04-27 ENCOUNTER — Emergency Department (HOSPITAL_COMMUNITY)
Admission: EM | Admit: 2016-04-27 | Discharge: 2016-04-28 | Disposition: A | Discharge status: Home / Self Care | Payer: Self-pay | Attending: Dermatology | Admitting: Dermatology

## 2016-04-27 DIAGNOSIS — F909 Attention-deficit hyperactivity disorder, unspecified type: Secondary | ICD-10-CM | POA: Insufficient documentation

## 2016-04-27 DIAGNOSIS — Z5321 Procedure and treatment not carried out due to patient leaving prior to being seen by health care provider: Secondary | ICD-10-CM | POA: Insufficient documentation

## 2016-04-27 DIAGNOSIS — G43909 Migraine, unspecified, not intractable, without status migrainosus: Secondary | ICD-10-CM | POA: Insufficient documentation

## 2016-04-27 DIAGNOSIS — Z79899 Other long term (current) drug therapy: Secondary | ICD-10-CM | POA: Insufficient documentation

## 2016-04-27 NOTE — ED Notes (Addendum)
Called to update VS. Unable to locate at this time.

## 2016-04-27 NOTE — ED Triage Notes (Signed)
Pt complains of migraine that began this morning and 3 hours ago pt began having radiation down left side of neck.  Neuro intact. Pt took tylenol, ibuprofen and excedrin without relief.

## 2016-04-28 NOTE — ED Notes (Signed)
Pt called for room placement with no answer; pt has been called for update of vitals earlier with no answer; assumed LWBS

## 2016-04-28 NOTE — ED Notes (Signed)
No response from pt in lobby for vital signs.

## 2016-05-02 ENCOUNTER — Encounter (HOSPITAL_COMMUNITY): Payer: Self-pay | Admitting: Emergency Medicine

## 2016-05-02 ENCOUNTER — Emergency Department (HOSPITAL_COMMUNITY)
Admission: EM | Admit: 2016-05-02 | Discharge: 2016-05-03 | Disposition: A | Discharge status: Home / Self Care | Payer: BLUE CROSS/BLUE SHIELD | Attending: Emergency Medicine | Admitting: Emergency Medicine

## 2016-05-02 DIAGNOSIS — N946 Dysmenorrhea, unspecified: Secondary | ICD-10-CM | POA: Insufficient documentation

## 2016-05-02 DIAGNOSIS — F909 Attention-deficit hyperactivity disorder, unspecified type: Secondary | ICD-10-CM | POA: Insufficient documentation

## 2016-05-02 DIAGNOSIS — N939 Abnormal uterine and vaginal bleeding, unspecified: Secondary | ICD-10-CM | POA: Diagnosis present

## 2016-05-02 LAB — URINALYSIS, ROUTINE W REFLEX MICROSCOPIC
BACTERIA UA: NONE SEEN
Glucose, UA: NEGATIVE mg/dL
Hgb urine dipstick: NEGATIVE
KETONES UR: 5 mg/dL — AB
Nitrite: NEGATIVE
PROTEIN: 30 mg/dL — AB
Specific Gravity, Urine: 1.042 — ABNORMAL HIGH (ref 1.005–1.030)
pH: 5 (ref 5.0–8.0)

## 2016-05-02 LAB — POC URINE PREG, ED: Preg Test, Ur: NEGATIVE

## 2016-05-02 MED ORDER — IBUPROFEN 400 MG PO TABS
ORAL_TABLET | ORAL | Status: AC
  Filled 2016-05-02: qty 1

## 2016-05-02 MED ORDER — IBUPROFEN 200 MG PO TABS
ORAL_TABLET | ORAL | Status: AC
  Filled 2016-05-02: qty 1

## 2016-05-02 MED ORDER — KETOROLAC TROMETHAMINE 60 MG/2ML IM SOLN
30.0000 mg | Freq: Once | INTRAMUSCULAR | Status: AC
  Administered 2016-05-03: 30 mg via INTRAMUSCULAR
  Filled 2016-05-02: qty 2

## 2016-05-02 MED ORDER — IBUPROFEN 400 MG PO TABS
600.0000 mg | ORAL_TABLET | Freq: Once | ORAL | Status: AC
  Administered 2016-05-02: 20:00:00 600 mg via ORAL

## 2016-05-02 NOTE — ED Provider Notes (Signed)
[facility]-EMERGENCY DEPT Provider Note    By signing my name below, I, Earmon PhoenixJennifer Waddell, attest that this documentation has been prepared under the direction and in the presence of [facility] Melody Cirrincione, [place]. Electronically Signed: Earmon PhoenixJennifer Waddell, ED Scribe. 05/02/16. 11:16 PM.    History   Chief Complaint Chief Complaint  Patient presents with  . Back Pain  . Vaginal Bleeding   The history is provided by the patient and medical records. No language interpreter was used.    Tanya BarkerKeshia Sanchez is a 26 y.o. female who presents to the Emergency Department complaining of light vaginal bleeding that began three days ago. She states the bleeding is lighter than a normal period. She reports associated sharp low back pain. She has been taking generic Excedrin for pain (last dose this morning) with minimal relief. She denies modifying factors. She denies nausea, vomiting, abdominal pain, vaginal discharge, dysuria or any other urinary complaints. Her LMP was mid January. She is not sexually active and has never been. She reports previous pap smears have all been normal. She denies any trauma, injury or fall. She denies family h/o fibroids or menstrual problems. She denies allergies to any medications.   Past Medical History:  Diagnosis Date  . ADHD (attention deficit hyperactivity disorder)   . Gallstones   . Panic attack 2016    Patient Active Problem List   Diagnosis Date Noted  . Symptomatic cholelithiasis 10/14/2014  . Epigastric abdominal pain 10/11/2014  . Chronic tension headaches 10/11/2014  . Vitamin D deficiency 07/31/2014  . Panic attacks 07/30/2014  . Poor dentition 07/30/2014  . Allergic rhinitis 07/30/2014    Past Surgical History:  Procedure Laterality Date  . CHOLECYSTECTOMY N/A 10/15/2014   Procedure: LAPAROSCOPIC CHOLECYSTECTOMY WITH INTRAOPERATIVE CHOLANGIOGRAM;  Surgeon: Jimmye NormanJames Wyatt, MD;  Location: [facility] OR;  Service: General;  Laterality: N/A;    OB History    No data  available       Home Medications    Prior to Admission medications   Medication Sig Start Date End Date Taking? Authorizing Provider  acetaminophen (TYLENOL) 325 MG tablet Take 650 mg by mouth every 6 (six) hours as needed for headache.    Historical Provider, MD  amitriptyline (ELAVIL) 25 MG tablet Take 1 tablet (25 mg total) by mouth at bedtime. Patient not taking: Reported on 12/16/2015 02/26/15   Ambrose FinlandValerie A Keck, NP  benzonatate (TESSALON) 100 MG capsule Take 1 capsule (100 mg total) by mouth every 8 (eight) hours. 02/05/16   Jissel Slavens Orlene OchM Raguel Kosloski, NP  naproxen sodium (ANAPROX DS) 550 MG tablet Take 1 tablet (550 mg total) by mouth 2 (two) times daily with a meal. 05/03/16   Deeanne Deininger Orlene OchM Donna Silverman, NP  pseudoephedrine (SUDAFED) 30 MG tablet Take 1 tablet (30 mg total) by mouth every 6 (six) hours as needed for congestion. 02/05/16   Shanisha Lech Orlene OchM Narciso Stoutenburg, NP    Family History Family History  Problem Relation Age of Onset  . Heart disease Maternal Grandmother   . Hypertension Maternal Grandmother   . Depression Maternal Grandmother   . Arthritis Maternal Grandmother   . Depression Mother   . Cancer Mother   . Bladder Cancer Mother   . Depression Sister     Social History Social History  Substance Use Topics  . Smoking status: Never Smoker  . Smokeless tobacco: Never Used  . Alcohol use No     Allergies   Patient has no known allergies.   Review of Systems Review of Systems  Constitutional: Negative  for chills and fever.  Respiratory: Negative for chest tightness and shortness of breath.   Gastrointestinal: Negative for abdominal pain (mild cramping), constipation, diarrhea, nausea and vomiting.  Genitourinary: Positive for vaginal bleeding. Negative for dysuria, flank pain, frequency and urgency.  Musculoskeletal: Positive for back pain. Negative for neck pain.  Skin: Negative for rash.  Neurological: Negative for light-headedness and headaches.  Psychiatric/Behavioral: Negative for confusion.      Physical Exam Updated Vital Signs BP 125/91 (BP Location: Left Arm)   Pulse 81   Temp 98.5 F (36.9 C) (Oral)   Resp 18   Ht 5\' 2"  (1.575 m)   Wt 160 lb (72.6 kg)   LMP 04/29/2016 (Approximate)   SpO2 99%   BMI 29.26 kg/m   Physical Exam  Constitutional: She appears well-developed and well-nourished. No distress.  HENT:  Head: Normocephalic.  Eyes: EOM are normal.  Neck: Neck supple.  Cardiovascular: Normal rate and regular rhythm.   No murmur heard. Pulmonary/Chest: Effort normal and breath sounds normal.  No lower extremity swelling.  Abdominal: Soft. Bowel sounds are normal. There is no tenderness. There is no CVA tenderness.  Genitourinary: Uterus normal. Pelvic exam was performed with patient supine. Uterus is not enlarged and not tender. Cervix exhibits no motion tenderness. Right adnexum displays no tenderness. Left adnexum displays no tenderness. No tenderness in the vagina. No foreign body in the vagina. Vaginal discharge found.  Genitourinary Comments: Examination chaperoned by female scribe. No CMT. No adnexal tenderness. No bleeding. Yellow discharge noted in vaginal vault.  Musculoskeletal: Normal range of motion.  Neurological: She is alert.  Skin: Skin is warm and dry.  Psychiatric: She has a normal mood and affect. Her behavior is normal.  Nursing note and vitals reviewed.    ED Treatments / Results   DIAGNOSTIC STUDIES: Oxygen Saturation is 99% on RA, normal by my interpretation.   COORDINATION OF CARE: 9:54 PM- Will perform pelvic exam. Pt verbalizes understanding and agrees to plan.  Labs (all labs ordered are listed, but only abnormal results are displayed) Labs Reviewed  WET PREP, GENITAL - Abnormal; Notable for the following:       Result Value   WBC, Wet Prep HPF POC MANY (*)    All other components within normal limits  URINALYSIS, ROUTINE W REFLEX MICROSCOPIC - Abnormal; Notable for the following:    Color, Urine AMBER (*)     APPearance HAZY (*)    Specific Gravity, Urine 1.042 (*)    Bilirubin Urine SMALL (*)    Ketones, ur 5 (*)    Protein, ur 30 (*)    Leukocytes, UA TRACE (*)    Squamous Epithelial / LPF 6-30 (*)    All other components within normal limits  POC URINE PREG, ED  GC/CHLAMYDIA PROBE AMP ([place]) NOT AT [facility]   Radiology No results found.  Procedures Procedures (including critical care time)  Medications Ordered in ED Medications  ibuprofen (ADVIL,MOTRIN) 400 MG tablet (not administered)  ibuprofen (ADVIL,MOTRIN) 200 MG tablet (not administered)  ibuprofen (ADVIL,MOTRIN) tablet 600 mg (600 mg Oral Given 05/02/16 2012)  ketorolac (TORADOL) injection 30 mg (30 mg Intramuscular Given 05/03/16 0004)     Initial Impression / Assessment and Plan / ED Course  I have reviewed the triage vital signs and the nursing notes.  Pertinent lab results that were available during my care of the patient were reviewed by me and considered in my medical decision making (see chart for details).  I personally performed the services described in this documentation, which was scribed in my presence. The recorded information has been reviewed and is accurate.  Final Clinical Impressions(s) / ED Diagnoses  26 y.o. female with vaginal bleeding that is lighter than a period with low back cramping stable for d/c without hemorrhage, acute abdomen and does not appear toxic. Discussed with the patient she may be starting her period a few days early. Her pain resolved with injection of Toradol 30 mg.she will make an appointment for follow up at the [facility] out patient clinic.  Final diagnoses:  Dysmenorrhea    New Prescriptions New Prescriptions   NAPROXEN SODIUM (ANAPROX DS) 550 MG TABLET    Take 1 tablet (550 mg total) by mouth 2 (two) times daily with a meal.     Janne Napoleon, NP 05/03/16 0130    Gilda Crease, MD 05/03/16 [id number]

## 2016-05-02 NOTE — ED Triage Notes (Signed)
Pt arrives from home with c/o vaginal bleeding ongoing for the past several days, states low back pain as well. Reports sharp shooting pain in lower back. States small amount of bleeding, similar to the beginning of her period. States [employer] brand pain medicine not effective.

## 2016-05-02 NOTE — ED Notes (Signed)
See PA assessment 

## 2016-05-03 LAB — WET PREP, GENITAL
Clue Cells Wet Prep HPF POC: NONE SEEN
SPERM: NONE SEEN
TRICH WET PREP: NONE SEEN
YEAST WET PREP: NONE SEEN

## 2016-05-03 MED ORDER — NAPROXEN SODIUM 550 MG PO TABS: 550.0000 mg | ORAL_TABLET | Freq: Two times a day (BID) | ORAL | 0 refills | Status: DC

## 2016-05-03 NOTE — ED Notes (Signed)
Pt stable, ambulatory, states understanding of discharge instructions 

## 2016-05-03 NOTE — Discharge Instructions (Signed)
You may be starting your period a few days early. We are giving you medication for your pain. [facility] Call their office for an appointment.

## 2016-05-04 LAB — GC/CHLAMYDIA PROBE AMP (~~LOC~~) NOT AT ARMC
Chlamydia: NEGATIVE
Neisseria Gonorrhea: NEGATIVE

## 2016-05-14 ENCOUNTER — Ambulatory Visit: Payer: BLUE CROSS/BLUE SHIELD | Admitting: Family Medicine

## 2016-06-05 ENCOUNTER — Encounter: Payer: Self-pay | Admitting: Family Medicine

## 2016-06-05 ENCOUNTER — Ambulatory Visit: Payer: BLUE CROSS/BLUE SHIELD | Attending: Family Medicine | Admitting: Licensed Clinical Social Worker

## 2016-06-05 ENCOUNTER — Ambulatory Visit: Payer: BLUE CROSS/BLUE SHIELD | Attending: Family Medicine | Admitting: Family Medicine

## 2016-06-05 VITALS — BP 121/76 | HR 84 | Temp 98.0°F | Resp 18 | Ht 62.0 in | Wt 160.0 lb

## 2016-06-05 DIAGNOSIS — G44229 Chronic tension-type headache, not intractable: Secondary | ICD-10-CM | POA: Diagnosis not present

## 2016-06-05 DIAGNOSIS — M545 Low back pain: Secondary | ICD-10-CM | POA: Diagnosis not present

## 2016-06-05 DIAGNOSIS — F418 Other specified anxiety disorders: Secondary | ICD-10-CM | POA: Diagnosis not present

## 2016-06-05 DIAGNOSIS — G8929 Other chronic pain: Secondary | ICD-10-CM

## 2016-06-05 DIAGNOSIS — F329 Major depressive disorder, single episode, unspecified: Secondary | ICD-10-CM | POA: Diagnosis not present

## 2016-06-05 DIAGNOSIS — F419 Anxiety disorder, unspecified: Secondary | ICD-10-CM | POA: Insufficient documentation

## 2016-06-05 DIAGNOSIS — G43909 Migraine, unspecified, not intractable, without status migrainosus: Secondary | ICD-10-CM | POA: Diagnosis present

## 2016-06-05 DIAGNOSIS — F32A Depression, unspecified: Secondary | ICD-10-CM

## 2016-06-05 MED ORDER — IBUPROFEN 800 MG PO TABS: 800.0000 mg | ORAL_TABLET | Freq: Three times a day (TID) | ORAL | 0 refills | Status: DC | PRN

## 2016-06-05 MED ORDER — AMITRIPTYLINE HCL 25 MG PO TABS: 25.0000 mg | ORAL_TABLET | Freq: Every day | ORAL | 1 refills | Status: DC

## 2016-06-05 MED FILL — IBUPROFEN 800 MG TABLET: 800 | 12 days supply | Qty: 40 | Fill #0

## 2016-06-05 MED FILL — AMITRIPTYLINE HCL 25 MG TAB: 25 | 30 days supply | Qty: 30 | Fill #0

## 2016-06-05 NOTE — BH Specialist Note (Signed)
Integrated Behavioral Health Initial Visit  MRN: [id number] Name: Tanya BarkerKeshia Sanchez   Session Start time: 4:00 PM Session End time: 4:30 PM Total time: 30 minutes  Type of Service: Integrated Behavioral Health- Individual/Family Interpretor:No. Interpretor Name and Language: N/A   Warm Hand Off Completed.       SUBJECTIVE: Tanya Sanchez is a 26 y.o. female accompanied by patient. Patient was referred by FNP [place] for anxiety. Patient reports the following symptoms/concerns: feelings of worry, difficulty sleeping, racing thoughts, decreased appetite, and low energy Duration of problem: 1 year; Severity of problem: moderate  OBJECTIVE: Mood: Anxious and Affect: Appropriate Risk of harm to self or others: No plan to harm self or others   LIFE CONTEXT: Family and Social: Pt resides with her immediate family noting her grandmother resides around the corner School/Work: Pt is employed part-time in Restaurant manager, fast foodjanitorial services Self-Care: Pt enjoys listening to music and playing video games to cope with managing mental health and stress. No report of substance use Life Changes: Pt shared that mother was diagnosed with cancer approx 3 years ago and recently had to change treatment. Pt provides emotional and financial support to mother  GOALS ADDRESSED: Patient will reduce symptoms of: anxiety and depression and increase knowledge and/or ability of: coping skills and also: Increase adequate support systems for patient/family   INTERVENTIONS: Solution-Focused Strategies, Supportive Counseling and Psychoeducation and/or Health Education  Standardized Assessments completed: GAD-7, PHQ 2 and PHQ 9  ASSESSMENT: Patient currently experiencing depression and anxiety triggered by stress from caring for mother who was diagnosed with cancer approximately three years ago. Patient shared that she is often worried about her mother's health. Patient reports feelings of worry, difficulty sleeping,  racing thoughts, decreased appetite, and low energy. Patient may benefit from psychoeducation, psychotherapy, and medication management. LCSWA educated pt on how psychosocial stressors can negatively impact one's mental and physical health. LCSWA discussed benefits of applying healthy coping skills to decrease symptoms of depression and anxiety. Pt shared multiple healthy strategies she plans to implement on a daily basis. Pt has hx of participating in medication management. LCSWA provided pt with community resources for crisis intervention, psychotherapy, food insecurity, and support groups for families who care for loved ones receiving treatment for cancer. Pt is open to attending support groups and will encourage mother to participate, as well.   PLAN: 1. Follow up with behavioral health clinician on : Pt was encouraged to contact LCSWA if symptoms worsen or fail to improve to [facility]. 2. Behavioral recommendations: LCSWA recommends that pt apply healthy coping skills discussed, comply with medication management, strengthen support system by participating in support groups, and utilize resources provided. Pt is encouraged to schedule follow up appointment with LCSWA 3. Referral(s): Community Resources:  Environmental consultantood and Support Groups 4. "From scale of 1-10, how likely are you to follow plan?": 8/10  Tanya LarssonJasmine D Sanchez Samad, LCSW 06/09/16 11:41 AM

## 2016-06-05 NOTE — Progress Notes (Signed)
Subjective:  Patient ID: Tanya Sanchez, female    DOB: 1991-03-22  Age: 26 y.o. MRN: [id number]  CC: No chief complaint on file.   HPI Tanya Sanchez presents for   Migraines: Per month 2 to 2 once a week . Aggravated symptoms are sounds. Reports taking Excedrin for symptoms. Pain  5/6 to 10 at worst.   Anxiety: 1 year. Denies SI/HI. Reports stressor is her mother's illness. Mother was diagnosed with stage 4 cancer 2 to 3 years ago. Reports strong family support system. She agrees to speaking with the soical  Lower back pain: About 1 year increased in intensity 2 months ago. Occasional burning sensations to mid-back. Reports history of heavy lifting. Used heating pad for symptoms with minimal relief.    Outpatient Medications Prior to Visit  Medication Sig Dispense Refill  . acetaminophen (TYLENOL) 325 MG tablet Take 650 mg by mouth every 6 (six) hours as needed for headache.    . benzonatate (TESSALON) 100 MG capsule Take 1 capsule (100 mg total) by mouth every 8 (eight) hours. 21 capsule 0  . naproxen sodium (ANAPROX DS) 550 MG tablet Take 1 tablet (550 mg total) by mouth 2 (two) times daily with a meal. 20 tablet 0  . pseudoephedrine (SUDAFED) 30 MG tablet Take 1 tablet (30 mg total) by mouth every 6 (six) hours as needed for congestion. 30 tablet 0  . amitriptyline (ELAVIL) 25 MG tablet Take 1 tablet (25 mg total) by mouth at bedtime. (Patient not taking: Reported on 12/16/2015) 30 tablet 1   No facility-administered medications prior to visit.     ROS Review of Systems  Eyes: Negative.   Respiratory: Negative.   Cardiovascular: Negative.   Gastrointestinal: Negative.   Musculoskeletal: Positive for back pain.  Skin: Negative.   Neurological: Positive for headaches.  Psychiatric/Behavioral: Negative for suicidal ideas. The patient is nervous/anxious.    Objective:  BP 121/76 (BP Location: Left Arm, Patient Position: Sitting, Cuff Size: Normal)   Pulse 84    Temp 98 F (36.7 C) (Oral)   Resp 18   Ht 5\' 2"  (1.575 m)   Wt 160 lb (72.6 kg)   SpO2 98%   BMI 29.26 kg/m   BP/Weight 06/05/2016 05/03/2016 05/02/2016  Systolic BP 121 121 -  Diastolic BP 76 84 -  Wt. (Lbs) 160 - 160  BMI 29.26 - 29.26    Physical Exam  Constitutional: She is oriented to person, place, and time.  Eyes: Conjunctivae are normal. Pupils are equal, round, and reactive to light.  Neck: No JVD present.  Cardiovascular: Normal rate, regular rhythm, normal heart sounds and intact distal pulses.   Pulmonary/Chest: Effort normal and breath sounds normal.  Abdominal: Soft. Bowel sounds are normal.  Musculoskeletal:       Lumbar back: She exhibits pain.  Neurological: She is alert and oriented to person, place, and time. She has normal reflexes.  Skin: Skin is warm and dry.  Psychiatric: She expresses no homicidal and no suicidal ideation. She expresses no suicidal plans and no homicidal plans.  Nursing note and vitals reviewed.   Depression screen [facility] 2/9 06/05/2016 02/26/2015 10/11/2014  Decreased Interest 2 0 0  Down, Depressed, Hopeless 1 0 0  PHQ - 2 Score 3 0 0  Altered sleeping 3 - -  Tired, decreased energy 2 - -  Change in appetite 2 - -  Feeling bad or failure about yourself  1 - -  Trouble concentrating 0 - -  Moving  slowly or fidgety/restless 0 - -  Suicidal thoughts 0 - -  PHQ-9 Score 11 - -   GAD 7 : Generalized Anxiety Score 06/05/2016  Nervous, Anxious, on Edge 3  Control/stop worrying 1  Worry too much - different things 1  Trouble relaxing 2  Restless 1  Easily annoyed or irritable 2  Afraid - awful might happen 2  Total GAD 7 Score 12    Assessment & Plan:   Problem List Items Addressed This Visit      Nervous and Auditory   Chronic tension headaches - Primary   Relevant Medications   amitriptyline (ELAVIL) 25 MG tablet   ibuprofen (ADVIL,MOTRIN) 800 MG tablet    Other Visit Diagnoses    Anxiety and depression       Relevant  Medications   amitriptyline (ELAVIL) 25 MG tablet   Chronic bilateral low back pain without sciatica       Relevant Medications   ibuprofen (ADVIL,MOTRIN) 800 MG tablet      Meds ordered this encounter  Medications  . amitriptyline (ELAVIL) 25 MG tablet    Sig: Take 1 tablet (25 mg total) by mouth at bedtime.    Dispense:  30 tablet    Refill:  1    Order Specific Question:   Supervising Provider    Answer:   Quentin Angst [id number]  . ibuprofen (ADVIL,MOTRIN) 800 MG tablet    Sig: Take 1 tablet (800 mg total) by mouth every 8 (eight) hours as needed for headache or moderate pain (Take with food.).    Dispense:  40 tablet    Refill:  0    Order Specific Question:   Supervising Provider    Answer:   Quentin Angst [id number]    Follow-up: Return if symptoms worsen or fail to improve. Return in about 8 weeks (around 07/31/2016), for Anxiety/Depression .   Lizbeth Bark FNP

## 2016-06-05 NOTE — Patient Instructions (Addendum)
Back Exercises If you have pain in your back, do these exercises 2-3 times each day or as told by your doctor. When the pain goes away, do the exercises once each day, but repeat the steps more times for each exercise (do more repetitions). If you do not have pain in your back, do these exercises once each day or as told by your doctor. Exercises Single Knee to Chest   Do these steps 3-5 times in a row for each leg: 1. Lie on your back on a firm bed or the floor with your legs stretched out. 2. Bring one knee to your chest. 3. Hold your knee to your chest by grabbing your knee or thigh. 4. Pull on your knee until you feel a gentle stretch in your lower back. 5. Keep doing the stretch for 10-30 seconds. 6. Slowly let go of your leg and straighten it. Pelvic Tilt   Do these steps 5-10 times in a row: 1. Lie on your back on a firm bed or the floor with your legs stretched out. 2. Bend your knees so they point up to the ceiling. Your feet should be flat on the floor. 3. Tighten your lower belly (abdomen) muscles to press your lower back against the floor. This will make your tailbone point up to the ceiling instead of pointing down to your feet or the floor. 4. Stay in this position for 5-10 seconds while you gently tighten your muscles and breathe evenly. Cat-Cow   Do these steps until your lower back bends more easily: 1. Get on your hands and knees on a firm surface. Keep your hands under your shoulders, and keep your knees under your hips. You may put padding under your knees. 2. Let your head hang down, and make your tailbone point down to the floor so your lower back is round like the back of a cat. 3. Stay in this position for 5 seconds. 4. Slowly lift your head and make your tailbone point up to the ceiling so your back hangs low (sags) like the back of a cow. 5. Stay in this position for 5 seconds. Press-Ups   Do these steps 5-10 times in a row: 1. Lie on your belly (face-down) on  the floor. 2. Place your hands near your head, about shoulder-width apart. 3. While you keep your back relaxed and keep your hips on the floor, slowly straighten your arms to raise the top half of your body and lift your shoulders. Do not use your back muscles. To make yourself more comfortable, you may change where you place your hands. 4. Stay in this position for 5 seconds. 5. Slowly return to lying flat on the floor. Bridges   Do these steps 10 times in a row: 1. Lie on your back on a firm surface. 2. Bend your knees so they point up to the ceiling. Your feet should be flat on the floor. 3. Tighten your butt muscles and lift your butt off of the floor until your waist is almost as high as your knees. If you do not feel the muscles working in your butt and the back of your thighs, slide your feet 1-2 inches farther away from your butt. 4. Stay in this position for 3-5 seconds. 5. Slowly lower your butt to the floor, and let your butt muscles relax. If this exercise is too easy, try doing it with your arms crossed over your chest. Belly Crunches   Do these steps 5-10 times   in a row: 1. Lie on your back on a firm bed or the floor with your legs stretched out. 2. Bend your knees so they point up to the ceiling. Your feet should be flat on the floor. 3. Cross your arms over your chest. 4. Tip your chin a little bit toward your chest but do not bend your neck. 5. Tighten your belly muscles and slowly raise your chest just enough to lift your shoulder blades a tiny bit off of the floor. 6. Slowly lower your chest and your head to the floor. Back Lifts  Do these steps 5-10 times in a row: 1. Lie on your belly (face-down) with your arms at your sides, and rest your forehead on the floor. 2. Tighten the muscles in your legs and your butt. 3. Slowly lift your chest off of the floor while you keep your hips on the floor. Keep the back of your head in line with the curve in your back. Look at the  floor while you do this. 4. Stay in this position for 3-5 seconds. 5. Slowly lower your chest and your face to the floor. Contact a doctor if:  Your back pain gets a lot worse when you do an exercise.  Your back pain does not lessen 2 hours after you exercise. If you have any of these problems, stop doing the exercises. Do not do them again unless your doctor says it is okay. Get help right away if:  You have sudden, very bad back pain. If this happens, stop doing the exercises. Do not do them again unless your doctor says it is okay. This information is not intended to replace advice given to you by your health care provider. Make sure you discuss any questions you have with your health care provider. Document Released: 04/11/2010 Document Revised: 08/15/2015 Document Reviewed: 05/03/2014 Elsevier Interactive Patient Education  2017 [employer] Musculoskeletal Pain Musculoskeletal pain is muscle and bone aches and pains. This pain can occur in any part of the body. Follow these instructions at home:  Only take medicines for pain, discomfort, or fever as told by your health care provider.  You may continue all activities unless the activities cause more pain. When the pain lessens, slowly resume normal activities. Gradually increase the intensity and duration of the activities or exercise.  During periods of severe pain, bed rest may be helpful. Lie or sit in any position that is comfortable, but get out of bed and walk around at least every several hours.  If directed, put ice on the injured area.  Put ice in a plastic bag.  Place a towel between your skin and the bag.  Leave the ice on for 20 minutes, 2-3 times a day. Contact a health care provider if:  Your pain is getting worse.  Your pain is not relieved with medicines.  You lose function in the area of the pain if the pain is in your arms, legs, or neck. This information is not intended to replace advice given to you  by your health care provider. Make sure you discuss any questions you have with your health care provider. Document Released: 03/09/2005 Document Revised: 08/20/2015 Document Reviewed: 11/11/2012 Elsevier Interactive Patient Education  2017 [employer]   Tension Headache A tension headache is pain, pressure, or aching that is felt over the front and sides of your head. These headaches can last from 30 minutes to several days. Follow these instructions at home: Managing pain   Take  over-the-counter and prescription medicines only as told by your doctor.  Lie down in a dark, quiet room when you have a headache.  If directed, apply ice to your head and neck area:  Put ice in a plastic bag.  Place a towel between your skin and the bag.  Leave the ice on for 20 minutes, 2-3 times per day.  Use a heating pad or a hot shower to apply heat to your head and neck area as told by your doctor. Eating and drinking   Eat meals on a regular schedule.  Do not drink a lot of alcohol.  Do not use a lot of caffeine, or stop using caffeine. General instructions   Keep all follow-up visits as told by your doctor. This is important.  Keep a journal to find out if certain things bring on headaches. For example, write down:  What you eat and drink.  How much sleep you get.  Any change to your diet or medicines.  Try getting a massage, or doing other things that help you to relax.  Lessen stress.  Sit up straight. Do not tighten (tense) your muscles.  Do not use tobacco products. This includes cigarettes, chewing tobacco, or e-cigarettes. If you need help quitting, ask your doctor.  Exercise regularly as told by your doctor.  Get enough sleep. This may mean 7-9 hours of sleep. Contact a doctor if:  Your symptoms are not helped by medicine.  You have a headache that feels different from your usual headache.  You feel sick to your stomach (nauseous) or you throw up (vomit).  You  have a fever. Get help right away if:  Your headache becomes very bad.  You keep throwing up.  You have a stiff neck.  You have trouble seeing.  You have trouble speaking.  You have pain in your eye or ear.  Your muscles are weak or you lose muscle control.  You lose your balance or you have trouble walking.  You feel like you will pass out (faint) or you pass out.  You have confusion. This information is not intended to replace advice given to you by your health care provider. Make sure you discuss any questions you have with your health care provider. Document Released: 06/03/2009 Document Revised: 11/07/2015 Document Reviewed: 07/02/2014 Elsevier Interactive Patient Education  2017 [employer]     Living With Anxiety After being diagnosed with an anxiety disorder, you may be relieved to know why you have felt or behaved a certain way. It is natural to also feel overwhelmed about the treatment ahead and what it will mean for your life. With care and support, you can manage this condition and recover from it. How to cope with anxiety Dealing with stress  Stress is your body's reaction to life changes and events, both good and bad. Stress can last just a few hours or it can be ongoing. Stress can play a major role in anxiety, so it is important to learn both how to cope with stress and how to think about it differently. Talk with your health care provider or a counselor to learn more about stress reduction. He or she may suggest some stress reduction techniques, such as:  Music therapy. This can include creating or listening to music that you enjoy and that inspires you.  Mindfulness-based meditation. This involves being aware of your normal breaths, rather than trying to control your breathing. It can be done while sitting or walking.  Centering prayer.  This is a kind of meditation that involves focusing on a word, phrase, or sacred image that is meaningful to you and that  brings you peace.  Deep breathing. To do this, expand your stomach and inhale slowly through your nose. Hold your breath for 3-5 seconds. Then exhale slowly, allowing your stomach muscles to relax.  Self-talk. This is a skill where you identify thought patterns that lead to anxiety reactions and correct those thoughts.  Muscle relaxation. This involves tensing muscles then relaxing them. Choose a stress reduction technique that fits your lifestyle and personality. Stress reduction techniques take time and practice. Set aside 5-15 minutes a day to do them. Therapists can offer training in these techniques. The training may be covered by some insurance plans. Other things you can do to manage stress include:  Keeping a stress diary. This can help you learn what triggers your stress and ways to control your response.  Thinking about how you respond to certain situations. You may not be able to control everything, but you can control your reaction.  Making time for activities that help you relax, and not feeling guilty about spending your time in this way. Therapy combined with coping and stress-reduction skills provides the best chance for successful treatment. Medicines  Medicines can help ease symptoms. Medicines for anxiety include:  Anti-anxiety drugs.  Antidepressants.  Beta-blockers. Medicines may be used as the main treatment for anxiety disorder, along with therapy, or if other treatments are not working. Medicines should be prescribed by a health care provider. Relationships  Relationships can play a big part in helping you recover. Try to spend more time connecting with trusted friends and family members. Consider going to couples counseling, taking family education classes, or going to family therapy. Therapy can help you and others better understand the condition. How to recognize changes in your condition Everyone has a different response to treatment for anxiety. Recovery from  anxiety happens when symptoms decrease and stop interfering with your daily activities at home or work. This may mean that you will start to:  Have better concentration and focus.  Sleep better.  Be less irritable.  Have more energy.  Have improved memory. It is important to recognize when your condition is getting worse. Contact your health care provider if your symptoms interfere with home or work and you do not feel like your condition is improving. Where to find help and support: You can get help and support from these sources:  Self-help groups.  Online and [employer].  A trusted spiritual leader.  Couples counseling.  Family education classes.  Family therapy. Follow these instructions at home:  Eat a healthy diet that includes plenty of vegetables, fruits, whole grains, low-fat dairy products, and lean protein. Do not eat a lot of foods that are high in solid fats, added sugars, or salt.  Exercise. Most adults should do the following:  Exercise for at least 150 minutes each week. The exercise should increase your heart rate and make you sweat (moderate-intensity exercise).  Strengthening exercises at least twice a week.  Cut down on caffeine, tobacco, alcohol, and other potentially harmful substances.  Get the right amount and quality of sleep. Most adults need 7-9 hours of sleep each night.  Make choices that simplify your life.  Take over-the-counter and prescription medicines only as told by your health care provider.  Avoid caffeine, alcohol, and certain over-the-counter cold medicines. These may make you feel worse. Ask your pharmacist which medicines to avoid.  Keep all follow-up visits as told by your health care provider. This is important. Questions to ask your health care provider  Would I benefit from therapy?  How often should I follow up with a health care provider?  How long do I need to take medicine?  Are there any long-term  side effects of my medicine?  Are there any alternatives to taking medicine? Contact a health care provider if:  You have a hard time staying focused or finishing daily tasks.  You spend many hours a day feeling worried about everyday life.  You become exhausted by worry.  You start to have headaches, feel tense, or have nausea.  You urinate more than normal.  You have diarrhea. Get help right away if:  You have a racing heart and shortness of breath.  You have thoughts of hurting yourself or others. If you ever feel like you may hurt yourself or others, or have thoughts about taking your own life, get help right away. You can go to your nearest emergency department or call:  Your local emergency services (911 in the [place]  A suicide crisis helpline, such as the National Suicide Prevention Lifeline at 641-116-3988. This is open 24-hours a day. Summary  Taking steps to deal with stress can help calm you.  Medicines cannot cure anxiety disorders, but they can help ease symptoms.  Family, friends, and partners can play a big part in helping you recover from an anxiety disorder. This information is not intended to replace advice given to you by your health care provider. Make sure you discuss any questions you have with your health care provider. Document Released: 03/03/2016 Document Revised: 03/03/2016 Document Reviewed: 03/03/2016 Elsevier Interactive Patient Education  2017 [employer].

## 2016-06-05 NOTE — Progress Notes (Signed)
Patient is here for lower back pain   Patient also complains about having migraines that comes and goes and when she haves them its to the point where she cant handle it  Patient declined the flu  shot today

## 2016-06-27 ENCOUNTER — Encounter (HOSPITAL_COMMUNITY): Payer: Self-pay | Admitting: Emergency Medicine

## 2016-06-27 ENCOUNTER — Emergency Department (HOSPITAL_COMMUNITY)
Admission: EM | Admit: 2016-06-27 | Discharge: 2016-06-27 | Disposition: A | Discharge status: Home / Self Care | Payer: BLUE CROSS/BLUE SHIELD | Attending: Emergency Medicine | Admitting: Emergency Medicine

## 2016-06-27 ENCOUNTER — Emergency Department (HOSPITAL_COMMUNITY): Payer: BLUE CROSS/BLUE SHIELD

## 2016-06-27 DIAGNOSIS — Y990 Civilian activity done for income or pay: Secondary | ICD-10-CM | POA: Insufficient documentation

## 2016-06-27 DIAGNOSIS — M545 Low back pain, unspecified: Secondary | ICD-10-CM

## 2016-06-27 DIAGNOSIS — Y9389 Activity, other specified: Secondary | ICD-10-CM | POA: Insufficient documentation

## 2016-06-27 DIAGNOSIS — F909 Attention-deficit hyperactivity disorder, unspecified type: Secondary | ICD-10-CM | POA: Insufficient documentation

## 2016-06-27 DIAGNOSIS — Z7982 Long term (current) use of aspirin: Secondary | ICD-10-CM | POA: Diagnosis not present

## 2016-06-27 DIAGNOSIS — X500XXA Overexertion from strenuous movement or load, initial encounter: Secondary | ICD-10-CM | POA: Insufficient documentation

## 2016-06-27 DIAGNOSIS — Y929 Unspecified place or not applicable: Secondary | ICD-10-CM | POA: Diagnosis not present

## 2016-06-27 LAB — URINALYSIS, ROUTINE W REFLEX MICROSCOPIC
Bilirubin Urine: NEGATIVE
Glucose, UA: NEGATIVE mg/dL
Hgb urine dipstick: NEGATIVE
KETONES UR: NEGATIVE mg/dL
Nitrite: NEGATIVE
PROTEIN: NEGATIVE mg/dL
Specific Gravity, Urine: 1.025 (ref 1.005–1.030)
pH: 5 (ref 5.0–8.0)

## 2016-06-27 LAB — PREGNANCY, URINE: Preg Test, Ur: NEGATIVE

## 2016-06-27 MED ORDER — KETOROLAC TROMETHAMINE 60 MG/2ML IM SOLN
60.0000 mg | Freq: Once | INTRAMUSCULAR | Status: AC
  Administered 2016-06-27: 60 mg via INTRAMUSCULAR
  Filled 2016-06-27: qty 2

## 2016-06-27 MED ORDER — HYDROCODONE-ACETAMINOPHEN 5-325 MG PO TABS
2.0000 | ORAL_TABLET | Freq: Once | ORAL | Status: AC
  Administered 2016-06-27: 2 via ORAL
  Filled 2016-06-27: qty 2

## 2016-06-27 MED ORDER — HYDROCODONE-ACETAMINOPHEN 5-325 MG PO TABS: 1.0000 | ORAL_TABLET | ORAL | 0 refills | Status: DC | PRN

## 2016-06-27 MED ORDER — CYCLOBENZAPRINE HCL 10 MG PO TABS
10.0000 mg | ORAL_TABLET | Freq: Once | ORAL | Status: AC
  Administered 2016-06-27: 10 mg via ORAL
  Filled 2016-06-27: qty 1

## 2016-06-27 MED ORDER — CEPHALEXIN 500 MG PO CAPS: 500.0000 mg | ORAL_CAPSULE | Freq: Three times a day (TID) | ORAL | 0 refills | Status: AC

## 2016-06-27 MED ORDER — CYCLOBENZAPRINE HCL 10 MG PO TABS: 10.0000 mg | ORAL_TABLET | Freq: Three times a day (TID) | ORAL | 0 refills | Status: DC | PRN

## 2016-06-27 NOTE — ED Triage Notes (Signed)
Pt to ER for evaluation of worsening thoracic/lumbar back pain, reports onset 2 months ago. Denies bowel/bladder incontinence but does report numbness in bilateral hands this morning. Ambulatory at present time. Has seen a physician for this pain and reports pain meds are not helping. A/o x4. VSS.

## 2016-06-27 NOTE — ED Provider Notes (Signed)
[facility]-EMERGENCY DEPT Provider Note   CSN: [id number] Arrival date & time: 06/27/16  1341     History   Chief Complaint Chief Complaint  Patient presents with  . Back Pain    HPI Tanya Sanchez is a 26 y.o. female.  HPI   26 yo F with h/o chronic back pain here with worsening back pain. Pt reports intermittent low back pain in her lumbar spine. Pain is aching, cramp like and shoots up her back. Pain has been present x 2 years intermittently but is worse over past 2-3 days. No preceding trauma. Pain does worsen when she works and lifts/twists. Pain worsens with movement. No alleviating factors, has tried OTC meds without relief. No fever, chills. No h/o IVDU. No night sweats. No LE weakness, or numbness. She did have transient episode of tingling in her hands when she had severe pain.  Past Medical History:  Diagnosis Date  . ADHD (attention deficit hyperactivity disorder)   . Gallstones   . Panic attack 2016    Patient Active Problem List   Diagnosis Date Noted  . Symptomatic cholelithiasis 10/14/2014  . Epigastric abdominal pain 10/11/2014  . Chronic tension headaches 10/11/2014  . Vitamin D deficiency 07/31/2014  . Panic attacks 07/30/2014  . Poor dentition 07/30/2014  . Allergic rhinitis 07/30/2014    Past Surgical History:  Procedure Laterality Date  . CHOLECYSTECTOMY N/A 10/15/2014   Procedure: LAPAROSCOPIC CHOLECYSTECTOMY WITH INTRAOPERATIVE CHOLANGIOGRAM;  Surgeon: Jimmye Norman, MD;  Location: [facility] OR;  Service: General;  Laterality: N/A;    OB History    No data available       Home Medications    Prior to Admission medications   Medication Sig Start Date End Date Taking? Authorizing Provider  amitriptyline (ELAVIL) 25 MG tablet Take 1 tablet (25 mg total) by mouth at bedtime. 06/05/16  Yes Lizbeth Bark, FNP  aspirin-acetaminophen-caffeine (EXCEDRIN MIGRAINE) 220 [id number] MG tablet Take 2 tablets by mouth every 6 (six) hours as needed for headache  (pain).   Yes Historical Provider, MD  ibuprofen (ADVIL,MOTRIN) 800 MG tablet Take 1 tablet (800 mg total) by mouth every 8 (eight) hours as needed for headache or moderate pain (Take with food.). 06/05/16  Yes Lizbeth Bark, FNP  benzonatate (TESSALON) 100 MG capsule Take 1 capsule (100 mg total) by mouth every 8 (eight) hours. Patient not taking: Reported on 06/27/2016 02/05/16   Janne Napoleon, NP  cephALEXin (KEFLEX) 500 MG capsule Take 1 capsule (500 mg total) by mouth 3 (three) times daily. 06/27/16 07/04/16  Shaune Pollack, MD  cyclobenzaprine (FLEXERIL) 10 MG tablet Take 1 tablet (10 mg total) by mouth 3 (three) times daily as needed for muscle spasms. 06/27/16   Shaune Pollack, MD  HYDROcodone-acetaminophen (NORCO/VICODIN) 5-325 MG tablet Take 1-2 tablets by mouth every 4 (four) hours as needed for severe pain. 06/27/16   Shaune Pollack, MD  naproxen sodium (ANAPROX DS) 550 MG tablet Take 1 tablet (550 mg total) by mouth 2 (two) times daily with a meal. Patient not taking: Reported on 06/27/2016 05/03/16   Janne Napoleon, NP  pseudoephedrine (SUDAFED) 30 MG tablet Take 1 tablet (30 mg total) by mouth every 6 (six) hours as needed for congestion. Patient not taking: Reported on 06/27/2016 02/05/16   [facility] Orlene Och, NP    Family History Family History  Problem Relation Age of Onset  . Heart disease Maternal Grandmother   . Hypertension Maternal Grandmother   . Depression Maternal Grandmother   .  Arthritis Maternal Grandmother   . Depression Mother   . Cancer Mother   . Bladder Cancer Mother   . Depression Sister     Social History Social History  Substance Use Topics  . Smoking status: Never Smoker  . Smokeless tobacco: Never Used  . Alcohol use No     Allergies   Patient has no known allergies.   Review of Systems Review of Systems  Constitutional: Positive for fatigue. Negative for chills and fever.  HENT: Negative for ear pain and sore throat.   Eyes: Negative for pain and  visual disturbance.  Respiratory: Negative for cough and shortness of breath.   Cardiovascular: Negative for chest pain and palpitations.  Gastrointestinal: Negative for abdominal pain and vomiting.  Genitourinary: Negative for dysuria and hematuria.  Musculoskeletal: Positive for back pain. Negative for arthralgias and gait problem.  Skin: Negative for color change and rash.  Neurological: Negative for seizures and syncope.  All other systems reviewed and are negative.    Physical Exam Updated Vital Signs BP 125/69   Pulse 90   Temp 98.3 F (36.8 C) (Oral)   Resp 18   Ht  (1.575 m)   Wt 160 lb (72.6 kg)   LMP 06/04/2016 (Approximate)   SpO2 100%   BMI 29.26 kg/m   Physical Exam  Constitutional: She is oriented to person, place, and time. She appears well-developed and well-nourished. No distress.  HENT:  Head: Normocephalic and atraumatic.  Eyes: Conjunctivae are normal.  Neck: Neck supple.  Cardiovascular: Normal rate, regular rhythm and normal heart sounds.  Exam reveals no friction rub.   No murmur heard. Pulmonary/Chest: Effort normal and breath sounds normal. No respiratory distress. She has no wheezes. She has no rales.  Abdominal: She exhibits no distension.  Musculoskeletal: She exhibits no edema.  Neurological: She is alert and oriented to person, place, and time. She exhibits normal muscle tone.  Skin: Skin is warm. Capillary refill takes less than 2 seconds.  Psychiatric: She has a normal mood and affect.  Nursing note and vitals reviewed.   Spine Exam: Inspection/Palpation: Moderate bilateral paraspinal tenderness throughout the lumbar spine. No midline tenderness. No deformity. Strength: 5/5 throughout LE bilaterally (hip flexion/extension, adduction/abduction; knee flexion/extension; foot dorsiflexion/plantarflexion, inversion/eversion; great toe inversion) Sensation: Intact to light touch in proximal and distal LE bilaterally Reflexes: 2+ quadriceps  and achilles reflexes  ED Treatments / Results  Labs (all labs ordered are listed, but only abnormal results are displayed) Labs Reviewed  URINALYSIS, ROUTINE W REFLEX MICROSCOPIC - Abnormal; Notable for the following:       Result Value   APPearance HAZY (*)    Leukocytes, UA LARGE (*)    Bacteria, UA FEW (*)    Squamous Epithelial / LPF 6-30 (*)    All other components within normal limits  PREGNANCY, URINE    EKG  EKG Interpretation None       Radiology Dg Thoracic Spine 2 View  Result Date: 06/27/2016 CLINICAL DATA:  Pt c/o back pain x 1 year, mainly in the lower part. Pt states she does a lot of heavy lifting. No known injuries. EXAM: THORACIC SPINE 2 VIEWS COMPARISON:  None. FINDINGS: Alignment is within normal limits. Bone mineralization is normal. No fracture line or displaced fracture fragment seen. No acute appearing cortical irregularity or osseous lesion. Visualized paravertebral soft tissues are unremarkable. IMPRESSION: Negative. Electronically Signed   By: Bary Richard M.D.   On: 06/27/2016 16:39   Dg Lumbar Spine  2-3 Views  Result Date: 06/27/2016 CLINICAL DATA:  Pt c/o back pain x 1 year, mainly in the lower part. Pt states she does a lot of heavy lifting. No known injuries. EXAM: LUMBAR SPINE - 2-3 VIEW COMPARISON:  None. FINDINGS: Minimal levoscoliosis of the lower lumbar spine, possibly accentuated by patient positioning. Alignment otherwise normal. Bone mineralization is normal. No fracture line or displaced fracture fragment. No acute appearing cortical irregularity or osseous lesion. Disc spaces are well preserved throughout. Upper sacrum appears normal. Cholecystectomy clips in the right upper quadrant. Visualized paravertebral soft tissues are otherwise unremarkable. IMPRESSION: 1. Minimal levoscoliosis of the lower lumbar spine. 2. No acute findings. Electronically Signed   By: Bary Richard M.D.   On: 06/27/2016 16:40    Procedures Procedures (including  critical care time)  Medications Ordered in ED Medications  ketorolac (TORADOL) injection 60 mg (60 mg Intramuscular Given 06/27/16 1525)  HYDROcodone-acetaminophen (NORCO/VICODIN) 5-325 MG per tablet 2 tablet (2 tablets Oral Given 06/27/16 1525)  cyclobenzaprine (FLEXERIL) tablet 10 mg (10 mg Oral Given 06/27/16 1525)     Initial Impression / Assessment and Plan / ED Course  I have reviewed the triage vital signs and the nursing notes.  Pertinent labs & imaging results that were available during my care of the patient were reviewed by me and considered in my medical decision making (see chart for details).   26 year old female with past medical history as above here with atraumatic bilateral paraspinal pain. I suspect this is secondary to paraspinal spasm and strain. She has a history of chronic arthritis with chronic back pain. No lower extremities weakness, numbness, or evidence of cauda equina or acute radiculopathy. Her urinalysis does show some mild pyuria, I suspect this is contamination but given her mild back pain, will cover with antibiotics. She has no fever, nausea, vomiting, CVA tenderness, or other evidence to suggest ponder [employer] systemic illness. Symptoms markedly improved with pain control here and will discharge home.   Final Clinical Impressions(s) / ED Diagnoses   Final diagnoses:  Acute bilateral low back pain without sciatica    New Prescriptions Discharge Medication List as of 06/27/2016  5:39 PM    START taking these medications   Details  cephALEXin (KEFLEX) 500 MG capsule Take 1 capsule (500 mg total) by mouth 3 (three) times daily., Starting Sat 06/27/2016, Until Sat 07/04/2016, Print    cyclobenzaprine (FLEXERIL) 10 MG tablet Take 1 tablet (10 mg total) by mouth 3 (three) times daily as needed for muscle spasms., Starting Sat 06/27/2016, Print    HYDROcodone-acetaminophen (NORCO/VICODIN) 5-325 MG tablet Take 1-2 tablets by mouth every 4 (four) hours as needed for  severe pain., Starting Sat 06/27/2016, Print         Shaune Pollack, MD 06/28/16 0000

## 2016-07-09 ENCOUNTER — Inpatient Hospital Stay: Payer: BLUE CROSS/BLUE SHIELD | Admitting: Family Medicine

## 2016-07-09 NOTE — Progress Notes (Deleted)
   Subjective:  Patient ID: Tanya Sanchez, female    DOB: 05/06/1990  Age: 26 y.o. MRN: [id number]  CC: No chief complaint on file.   HPI Tiawana Forgy presents for ***  Outpatient Medications Prior to Visit  Medication Sig Dispense Refill  . amitriptyline (ELAVIL) 25 MG tablet Take 1 tablet (25 mg total) by mouth at bedtime. 30 tablet 1  . aspirin-acetaminophen-caffeine (EXCEDRIN MIGRAINE) 250-250-65 MG tablet Take 2 tablets by mouth every 6 (six) hours as needed for headache (pain).    . benzonatate (TESSALON) 100 MG capsule Take 1 capsule (100 mg total) by mouth every 8 (eight) hours. (Patient not taking: Reported on 06/27/2016) 21 capsule 0  . cyclobenzaprine (FLEXERIL) 10 MG tablet Take 1 tablet (10 mg total) by mouth 3 (three) times daily as needed for muscle spasms. 30 tablet 0  . HYDROcodone-acetaminophen (NORCO/VICODIN) 5-325 MG tablet Take 1-2 tablets by mouth every 4 (four) hours as needed for severe pain. 12 tablet 0  . ibuprofen (ADVIL,MOTRIN) 800 MG tablet Take 1 tablet (800 mg total) by mouth every 8 (eight) hours as needed for headache or moderate pain (Take with food.). 40 tablet 0  . naproxen sodium (ANAPROX DS) 550 MG tablet Take 1 tablet (550 mg total) by mouth 2 (two) times daily with a meal. (Patient not taking: Reported on 06/27/2016) 20 tablet 0  . pseudoephedrine (SUDAFED) 30 MG tablet Take 1 tablet (30 mg total) by mouth every 6 (six) hours as needed for congestion. (Patient not taking: Reported on 06/27/2016) 30 tablet 0   No facility-administered medications prior to visit.     ROS Review of Systems      Objective:  There were no vitals taken for this visit.  BP/Weight 06/27/2016 06/05/2016 05/03/2016  Systolic BP 125 121 121  Diastolic BP 69 76 84  Wt. (Lbs) 160 160 -  BMI 29.26 29.26 -     Physical Exam   Assessment & Plan:   Problem List Items Addressed This Visit    None      No orders of the defined types were placed in this  encounter.   Follow-up: No Follow-up on file.   Lizbeth Bark FNP

## 2016-07-31 ENCOUNTER — Encounter: Payer: Self-pay | Admitting: Family Medicine

## 2016-07-31 ENCOUNTER — Ambulatory Visit: Payer: BLUE CROSS/BLUE SHIELD | Attending: Family Medicine | Admitting: Family Medicine

## 2016-07-31 VITALS — BP 121/79 | HR 81 | Temp 98.2°F | Resp 18 | Ht 62.0 in | Wt 159.2 lb

## 2016-07-31 DIAGNOSIS — Z809 Family history of malignant neoplasm, unspecified: Secondary | ICD-10-CM | POA: Insufficient documentation

## 2016-07-31 DIAGNOSIS — Z818 Family history of other mental and behavioral disorders: Secondary | ICD-10-CM | POA: Insufficient documentation

## 2016-07-31 DIAGNOSIS — F329 Major depressive disorder, single episode, unspecified: Secondary | ICD-10-CM | POA: Insufficient documentation

## 2016-07-31 DIAGNOSIS — G8929 Other chronic pain: Secondary | ICD-10-CM

## 2016-07-31 DIAGNOSIS — F419 Anxiety disorder, unspecified: Secondary | ICD-10-CM | POA: Insufficient documentation

## 2016-07-31 DIAGNOSIS — Z7982 Long term (current) use of aspirin: Secondary | ICD-10-CM | POA: Diagnosis not present

## 2016-07-31 DIAGNOSIS — R200 Anesthesia of skin: Secondary | ICD-10-CM | POA: Insufficient documentation

## 2016-07-31 DIAGNOSIS — M545 Low back pain: Secondary | ICD-10-CM | POA: Diagnosis not present

## 2016-07-31 MED ORDER — CYCLOBENZAPRINE HCL 10 MG PO TABS: 10.0000 mg | ORAL_TABLET | Freq: Three times a day (TID) | ORAL | 0 refills | Status: DC | PRN

## 2016-07-31 MED ORDER — BACK SUPPORT M/L MISC: 1.0000 "application " | Freq: Once | 0 refills | Status: AC

## 2016-07-31 MED ORDER — ACETAMINOPHEN-CODEINE #3 300-30 MG PO TABS: 1.0000 | ORAL_TABLET | Freq: Three times a day (TID) | ORAL | 0 refills | Status: DC | PRN

## 2016-07-31 MED ORDER — MELOXICAM 7.5 MG PO TABS: 7.5000 mg | ORAL_TABLET | Freq: Every day | ORAL | 1 refills | Status: DC

## 2016-07-31 MED FILL — ?MELOXICAM 7.5 MG TABLET: 7.5 | 30 days supply | Qty: 30 | Fill #0

## 2016-07-31 MED FILL — ACETAMINOPHEN/COD #3 TABLET: 300-30 | 10 days supply | Qty: 30 | Fill #0

## 2016-07-31 MED FILL — ?CYCLOBENZAPRINE 10 MG TABL: 10 | 6 days supply | Qty: 20 | Fill #0

## 2016-07-31 NOTE — Progress Notes (Signed)
Subjective:  Patient ID: Tanya BarkerKeshia Drennan, female    DOB: 1990/09/05  Age: 26 y.o. MRN: [id number]  CC: Establish Care   HPI Tanya Sanchez presents for     Back Pain: Patient presents for presents evaluation of low back problems. 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 Reports history of heavy lifting  Symptoms have been present for 2 years and include numbness in burning sensation that radiats to bilateral shoulder blades. Initial inciting event: none. Symptoms are worst: none. Alleviating factors identifiable by patient are recumbency. Exacerbating factors identifiable by patient are bending forwards, sitting and walking. Treatments so far initiated by patient: OTC ibuprofen and back brace. Previous lower back problems: yes. Previous workup: xray. Previous treatments: prescription NSAIDS and opiod medications..   Anxiety and depression: Patient complains of anxiety and depression.  She has the following symptoms: difficulty concentrating, feelings of losing control, racing thoughts. Onset of symptoms was approximately a few years ago, stable since that time. She denies current suicidal and homicidal ideation. Family history significant for anxiety, depression and substance abuse.Possible organic causes contributing are: none. Risk factors: positive family history in  mother and negative life event ,mother has cancer.  She also reports mother has had history of overdose twice in the past. Previous treatment includes none.  She reports coping mechanisms include listening to music, play games on the computer, and eating ice cream. She declines medication and referral for counseling at this time.    Outpatient Medications Prior to Visit  Medication Sig Dispense Refill  . amitriptyline (ELAVIL) 25 MG tablet Take 1 tablet (25 mg total) by mouth at bedtime. 30 tablet 1  . aspirin-acetaminophen-caffeine (EXCEDRIN MIGRAINE) 250-250-65 MG tablet Take 2 tablets by mouth every 6 (six) hours as needed  for headache (pain).    . benzonatate (TESSALON) 100 MG capsule Take 1 capsule (100 mg total) by mouth every 8 (eight) hours. (Patient not taking: Reported on 06/27/2016) 21 capsule 0  . HYDROcodone-acetaminophen (NORCO/VICODIN) 5-325 MG tablet Take 1-2 tablets by mouth every 4 (four) hours as needed for severe pain. 12 tablet 0  . ibuprofen (ADVIL,MOTRIN) 800 MG tablet Take 1 tablet (800 mg total) by mouth every 8 (eight) hours as needed for headache or moderate pain (Take with food.). 40 tablet 0  . naproxen sodium (ANAPROX DS) 550 MG tablet Take 1 tablet (550 mg total) by mouth 2 (two) times daily with a meal. (Patient not taking: Reported on 06/27/2016) 20 tablet 0  . pseudoephedrine (SUDAFED) 30 MG tablet Take 1 tablet (30 mg total) by mouth every 6 (six) hours as needed for congestion. (Patient not taking: Reported on 06/27/2016) 30 tablet 0  . cyclobenzaprine (FLEXERIL) 10 MG tablet Take 1 tablet (10 mg total) by mouth 3 (three) times daily as needed for muscle spasms. 30 tablet 0   No facility-administered medications prior to visit.     ROS Review of Systems  Constitutional: Negative.   Respiratory: Negative.   Cardiovascular: Negative.   Musculoskeletal: Positive for back pain.  Skin: Negative.   Neurological: Negative.   Psychiatric/Behavioral: Negative.     Objective:  BP 121/79 (BP Location: Left Arm, Patient Position: Sitting, Cuff Size: Normal)   Pulse 81   Temp 98.2 F (36.8 C) (Oral)   Resp 18   Ht 5\' 2"  (1.575 m)   Wt 159 lb 3.2 oz (72.2 kg)   LMP 07/07/2016   SpO2 100%   BMI 29.12 kg/m   BP/Weight 08/07/2016 08/06/2016 07/31/2016  Systolic BP  110 113 121  Diastolic BP 74 89 79  Wt. (Lbs) 160 159 159.2  BMI 26.63 29.08 29.12    Physical Exam  Constitutional: She is oriented to person, place, and time. She appears well-developed and well-nourished.  Cardiovascular: Normal rate, regular rhythm, normal heart sounds and intact distal pulses.   Pulmonary/Chest: Effort  normal and breath sounds normal.  Abdominal: Soft. Bowel sounds are normal.  Musculoskeletal:       Lumbar back: She exhibits pain (neg.straight leg test.).  Neurological: She is alert and oriented to person, place, and time. She has normal reflexes.  Skin: Skin is warm and dry.  Psychiatric: Her affect is blunt. She is slowed. She expresses no homicidal and no suicidal ideation. She expresses no suicidal plans and no homicidal plans.  Nursing note and vitals reviewed.   Assessment & Plan:   Problem List Items Addressed This Visit    None    Visit Diagnoses    Chronic bilateral low back pain without sciatica    -  Primary   Relevant Medications   acetaminophen-codeine (TYLENOL #3) 300-30 MG tablet   cyclobenzaprine (FLEXERIL) 10 MG tablet   Other Relevant Orders   Ambulatory referral to Orthopedics   Anxiety and depression       -Handout given about counseling resources available.       Meds ordered this encounter  Medications  . DISCONTD: meloxicam (MOBIC) 7.5 MG tablet    Sig: Take 1 tablet (7.5 mg total) by mouth daily.    Dispense:  30 tablet    Refill:  1    Order Specific Question:   Supervising Provider    Answer:   Quentin Angst [id number]  . Elastic Bandages & Supports (BACK SUPPORT M/L) MISC    Sig: 1 application by Does not apply route once. Apply to back for stability and support. To be fitted by medical supply.    Dispense:  1 each    Refill:  0    Order Specific Question:   Supervising Provider    Answer:   Quentin Angst [id number]  . acetaminophen-codeine (TYLENOL #3) 300-30 MG tablet    Sig: Take 1 tablet by mouth every 8 (eight) hours as needed for severe pain.    Dispense:  30 tablet    Refill:  0    Order Specific Question:   Supervising Provider    Answer:   Quentin Angst [id number]  . cyclobenzaprine (FLEXERIL) 10 MG tablet    Sig: Take 1 tablet (10 mg total) by mouth every 8 (eight) hours as needed for muscle spasms.     Dispense:  20 tablet    Refill:  0    Order Specific Question:   Supervising Provider    Answer:   Quentin Angst [[id number]]     Lizbeth Bark FNP

## 2016-07-31 NOTE — Patient Instructions (Signed)
Back Exercises If you have pain in your back, do these exercises 2-3 times each day or as told by your doctor. When the pain goes away, do the exercises once each day, but repeat the steps more times for each exercise (do more repetitions). If you do not have pain in your back, do these exercises once each day or as told by your doctor. Exercises Single Knee to Chest   Do these steps 3-5 times in a row for each leg: 1. Lie on your back on a firm bed or the floor with your legs stretched out. 2. Bring one knee to your chest. 3. Hold your knee to your chest by grabbing your knee or thigh. 4. Pull on your knee until you feel a gentle stretch in your lower back. 5. Keep doing the stretch for 10-30 seconds. 6. Slowly let go of your leg and straighten it. Pelvic Tilt   Do these steps 5-10 times in a row: 1. Lie on your back on a firm bed or the floor with your legs stretched out. 2. Bend your knees so they point up to the ceiling. Your feet should be flat on the floor. 3. Tighten your lower belly (abdomen) muscles to press your lower back against the floor. This will make your tailbone point up to the ceiling instead of pointing down to your feet or the floor. 4. Stay in this position for 5-10 seconds while you gently tighten your muscles and breathe evenly. Cat-Cow   Do these steps until your lower back bends more easily: 1. Get on your hands and knees on a firm surface. Keep your hands under your shoulders, and keep your knees under your hips. You may put padding under your knees. 2. Let your head hang down, and make your tailbone point down to the floor so your lower back is round like the back of a cat. 3. Stay in this position for 5 seconds. 4. Slowly lift your head and make your tailbone point up to the ceiling so your back hangs low (sags) like the back of a cow. 5. Stay in this position for 5 seconds. Press-Ups   Do these steps 5-10 times in a row: 1. Lie on your belly (face-down) on  the floor. 2. Place your hands near your head, about shoulder-width apart. 3. While you keep your back relaxed and keep your hips on the floor, slowly straighten your arms to raise the top half of your body and lift your shoulders. Do not use your back muscles. To make yourself more comfortable, you may change where you place your hands. 4. Stay in this position for 5 seconds. 5. Slowly return to lying flat on the floor. Bridges   Do these steps 10 times in a row: 1. Lie on your back on a firm surface. 2. Bend your knees so they point up to the ceiling. Your feet should be flat on the floor. 3. Tighten your butt muscles and lift your butt off of the floor until your waist is almost as high as your knees. If you do not feel the muscles working in your butt and the back of your thighs, slide your feet 1-2 inches farther away from your butt. 4. Stay in this position for 3-5 seconds. 5. Slowly lower your butt to the floor, and let your butt muscles relax. If this exercise is too easy, try doing it with your arms crossed over your chest. Belly Crunches   Do these steps 5-10 times   in a row: 1. Lie on your back on a firm bed or the floor with your legs stretched out. 2. Bend your knees so they point up to the ceiling. Your feet should be flat on the floor. 3. Cross your arms over your chest. 4. Tip your chin a little bit toward your chest but do not bend your neck. 5. Tighten your belly muscles and slowly raise your chest just enough to lift your shoulder blades a tiny bit off of the floor. 6. Slowly lower your chest and your head to the floor. Back Lifts  Do these steps 5-10 times in a row: 1. Lie on your belly (face-down) with your arms at your sides, and rest your forehead on the floor. 2. Tighten the muscles in your legs and your butt. 3. Slowly lift your chest off of the floor while you keep your hips on the floor. Keep the back of your head in line with the curve in your back. Look at the  floor while you do this. 4. Stay in this position for 3-5 seconds. 5. Slowly lower your chest and your face to the floor. Contact a doctor if:  Your back pain gets a lot worse when you do an exercise.  Your back pain does not lessen 2 hours after you exercise. If you have any of these problems, stop doing the exercises. Do not do them again unless your doctor says it is okay. Get help right away if:  You have sudden, very bad back pain. If this happens, stop doing the exercises. Do not do them again unless your doctor says it is okay. This information is not intended to replace advice given to you by your health care provider. Make sure you discuss any questions you have with your health care provider. Document Released: 04/11/2010 Document Revised: 08/15/2015 Document Reviewed: 05/03/2014 Elsevier Interactive Patient Education  2017 [employer] Back Pain, Adult Back pain is very common. The pain often gets better over time. The cause of back pain is usually not dangerous. Most people can learn to manage their back pain on their own. Follow these instructions at home: Watch your back pain for any changes. The following actions may help to lessen any pain you are feeling:  Stay active. Start with short walks on flat ground if you can. Try to walk farther each day.  Exercise regularly as told by your doctor. Exercise helps your back heal faster. It also helps avoid future injury by keeping your muscles strong and flexible.  Do not sit, drive, or stand in one place for more than 30 minutes.  Do not stay in bed. Resting more than 1-2 days can slow down your recovery.  Be careful when you bend or lift an object. Use good form when lifting:  Bend at your knees.  Keep the object close to your body.  Do not twist.  Sleep on a firm mattress. Lie on your side, and bend your knees. If you lie on your back, put a pillow under your knees.  Take medicines only as told by your  doctor.  Put ice on the injured area.  Put ice in a plastic bag.  Place a towel between your skin and the bag.  Leave the ice on for 20 minutes, 2-3 times a day for the first 2-3 days. After that, you can switch between ice and heat packs.  Avoid feeling anxious or stressed. Find good ways to deal with stress, such as exercise.  Maintain   a healthy weight. Extra weight puts stress on your back. Contact a doctor if:  You have pain that does not go away with rest or medicine.  You have worsening pain that goes down into your legs or buttocks.  You have pain that does not get better in one week.  You have pain at night.  You lose weight.  You have a fever or chills. Get help right away if:  You cannot control when you poop (bowel movement) or pee (urinate).  Your arms or legs feel weak.  Your arms or legs lose feeling (numbness).  You feel sick to your stomach (nauseous) or throw up (vomit).  You have belly (abdominal) pain.  You feel like you may pass out (faint). This information is not intended to replace advice given to you by your health care provider. Make sure you discuss any questions you have with your health care provider. Document Released: 08/26/2007 Document Revised: 08/15/2015 Document Reviewed: 07/11/2013 Elsevier Interactive Patient Education  2017 [employer]vier Inc.  

## 2016-07-31 NOTE — Progress Notes (Signed)
Patient is here for f/up  Patient complains about  Lower back pain radiated to her shoulder/arm   Patient is only taking headaches medication before bedtime  Patient ha snot eaten for today

## 2016-08-06 ENCOUNTER — Encounter (HOSPITAL_COMMUNITY): Payer: Self-pay | Admitting: Emergency Medicine

## 2016-08-06 ENCOUNTER — Emergency Department (HOSPITAL_COMMUNITY)
Admission: EM | Admit: 2016-08-06 | Discharge: 2016-08-06 | Disposition: A | Discharge status: Home / Self Care | Payer: BLUE CROSS/BLUE SHIELD | Attending: Emergency Medicine | Admitting: Emergency Medicine

## 2016-08-06 DIAGNOSIS — Y999 Unspecified external cause status: Secondary | ICD-10-CM | POA: Insufficient documentation

## 2016-08-06 DIAGNOSIS — Y929 Unspecified place or not applicable: Secondary | ICD-10-CM | POA: Diagnosis not present

## 2016-08-06 DIAGNOSIS — X500XXA Overexertion from strenuous movement or load, initial encounter: Secondary | ICD-10-CM | POA: Insufficient documentation

## 2016-08-06 DIAGNOSIS — G8929 Other chronic pain: Secondary | ICD-10-CM | POA: Diagnosis not present

## 2016-08-06 DIAGNOSIS — Z7982 Long term (current) use of aspirin: Secondary | ICD-10-CM | POA: Diagnosis not present

## 2016-08-06 DIAGNOSIS — M549 Dorsalgia, unspecified: Secondary | ICD-10-CM

## 2016-08-06 DIAGNOSIS — M62830 Muscle spasm of back: Secondary | ICD-10-CM | POA: Diagnosis not present

## 2016-08-06 DIAGNOSIS — F909 Attention-deficit hyperactivity disorder, unspecified type: Secondary | ICD-10-CM | POA: Diagnosis not present

## 2016-08-06 DIAGNOSIS — Z79899 Other long term (current) drug therapy: Secondary | ICD-10-CM | POA: Diagnosis not present

## 2016-08-06 DIAGNOSIS — Y9389 Activity, other specified: Secondary | ICD-10-CM | POA: Diagnosis not present

## 2016-08-06 DIAGNOSIS — M545 Low back pain: Secondary | ICD-10-CM | POA: Diagnosis present

## 2016-08-06 MED ORDER — MELOXICAM 15 MG PO TABS: 15.0000 mg | ORAL_TABLET | Freq: Every day | ORAL | 0 refills | Status: DC

## 2016-08-06 MED ORDER — KETOROLAC TROMETHAMINE 30 MG/ML IJ SOLN
30.0000 mg | Freq: Once | INTRAMUSCULAR | Status: AC
  Administered 2016-08-06: 30 mg via INTRAMUSCULAR
  Filled 2016-08-06: qty 1

## 2016-08-06 MED ORDER — METHOCARBAMOL 500 MG PO TABS: 500.0000 mg | ORAL_TABLET | Freq: Three times a day (TID) | ORAL | 0 refills | Status: DC | PRN

## 2016-08-06 NOTE — ED Provider Notes (Signed)
[facility]-EMERGENCY DEPT Provider Note    By signing my name below, I, Earmon Phoenix, attest that this documentation has been prepared under the direction and in the presence of [address] [employer]. Electronically Signed: Earmon Phoenix, ED Scribe. 08/06/16. 3:19 PM.   History   Chief Complaint Chief Complaint  Patient presents with  . Back Pain   The history is provided by the patient and medical records. No language interpreter was used.  Back Pain   This is a chronic problem. The current episode started more than 1 week ago. The problem occurs constantly. The problem has been gradually worsening. The pain is associated with lifting heavy objects. The pain is present in the lumbar spine and thoracic spine. The quality of the pain is described as shooting. Radiates to: into neck and BLE. The pain is at a severity of 10/10. The pain is severe. The symptoms are aggravated by bending, twisting and certain positions. The pain is the same all the time. Pertinent negatives include no chest pain, no fever, no numbness, no abdominal pain, no bowel incontinence, no perianal numbness, no bladder incontinence, no dysuria, no paresthesias, no tingling and no weakness. She has tried muscle relaxants, NSAIDs, analgesics and heat for the symptoms. The treatment provided mild relief.    HPI: Tanya Sanchez is a 26 y.o. female with PMHx of chronic back pain, ADHD, cholelithiasis, and panic attacks, who presents to the Emergency Department complaining of constant burning, stinging, sharp lower back pain that began worsening about one month ago, but has been present for about 2 years. She reports associated pain that radiates into her shoulders, neck and down BLE. She describes the pain as 10/10 constant sharp/stinging low back pain radiating through her upper back and into both legs, worse with movement/activity. Pt was seen about one month ago at [facility] ED and had thoracic and lumbar films showing mild  scoliosis but otherwise neg. She was also treated for a UTI (although U/A contaminated, so unclear if she truly had a UTI) and was prescribed Norco and Flexeril. She has taken Tylenol #3 prescribed by PCP, Norco and Flexeril that was given here at last visit with only minimal relief. She has also taken Excedrin that helped in the past but is no longer providing relief. She has applied heat therapy which helps relieve the pain temporarily. She denies alleviating factors. She endorses heavy lifting at work. She denies fever, chills, SOB, CP, abdominal pain, constipation, diarrhea, nausea, vomiting, dysuria, hematuria, body aches, numbness, tingling, focal weakness, saddle anesthesia/cauda equina symptoms, bowel or bladder incontinence, or any other complaints at this time. She denies any trauma, injury or fall. She denies h/o IV drug use or cancer.   Past Medical History:  Diagnosis Date  . ADHD (attention deficit hyperactivity disorder)   . Gallstones   . Panic attack 2016    Patient Active Problem List   Diagnosis Date Noted  . Symptomatic cholelithiasis 10/14/2014  . Epigastric abdominal pain 10/11/2014  . Chronic tension headaches 10/11/2014  . Vitamin D deficiency 07/31/2014  . Panic attacks 07/30/2014  . Poor dentition 07/30/2014  . Allergic rhinitis 07/30/2014    Past Surgical History:  Procedure Laterality Date  . CHOLECYSTECTOMY N/A 10/15/2014   Procedure: LAPAROSCOPIC CHOLECYSTECTOMY WITH INTRAOPERATIVE CHOLANGIOGRAM;  Surgeon: Jimmye Norman, MD;  Location: [facility] OR;  Service: General;  Laterality: N/A;    OB History    No data available       Home Medications    Prior to Admission medications  Medication Sig Start Date End Date Taking? Authorizing Provider  acetaminophen-codeine (TYLENOL #3) 300-30 MG tablet Take 1 tablet by mouth every 8 (eight) hours as needed for severe pain. 07/31/16   Lizbeth Bark, FNP  amitriptyline (ELAVIL) 25 MG tablet Take 1 tablet (25 mg  total) by mouth at bedtime. 06/05/16   Lizbeth Bark, FNP  aspirin-acetaminophen-caffeine (EXCEDRIN MIGRAINE) [phone] MG tablet Take 2 tablets by mouth every 6 (six) hours as needed for headache (pain).    [provider]  benzonatate (TESSALON) 100 MG capsule Take 1 capsule (100 mg total) by mouth every 8 (eight) hours. Patient not taking: Reported on 06/27/2016 02/05/16   Janne Napoleon, NP  cyclobenzaprine (FLEXERIL) 10 MG tablet Take 1 tablet (10 mg total) by mouth every 8 (eight) hours as needed for muscle spasms. 07/31/16   Lizbeth Bark, FNP  HYDROcodone-acetaminophen (NORCO/VICODIN) 5-325 MG tablet Take 1-2 tablets by mouth every 4 (four) hours as needed for severe pain. 06/27/16   Shaune Pollack, MD  ibuprofen (ADVIL,MOTRIN) 800 MG tablet Take 1 tablet (800 mg total) by mouth every 8 (eight) hours as needed for headache or moderate pain (Take with food.). 06/05/16   Lizbeth Bark, FNP  meloxicam (MOBIC) 7.5 MG tablet Take 1 tablet (7.5 mg total) by mouth daily. 07/31/16   Lizbeth Bark, FNP  naproxen sodium (ANAPROX DS) 550 MG tablet Take 1 tablet (550 mg total) by mouth 2 (two) times daily with a meal. Patient not taking: Reported on 06/27/2016 05/03/16   Janne Napoleon, NP  pseudoephedrine (SUDAFED) 30 MG tablet Take 1 tablet (30 mg total) by mouth every 6 (six) hours as needed for congestion. Patient not taking: Reported on 06/27/2016 02/05/16   Janne Napoleon, NP    Family History Family History  Problem Relation Age of Onset  . Heart disease Maternal Grandmother   . Hypertension Maternal Grandmother   . Depression Maternal Grandmother   . Arthritis Maternal Grandmother   . Depression Mother   . Cancer Mother   . Bladder Cancer Mother   . Depression Sister     Social History Social History  Substance Use Topics  . Smoking status: Never Smoker  . Smokeless tobacco: Never Used  . Alcohol use No     Allergies   Patient has no known  allergies.   Review of Systems Review of Systems  Constitutional: Negative for chills and fever.  Respiratory: Negative for shortness of breath.   Cardiovascular: Negative for chest pain.  Gastrointestinal: Negative for abdominal pain, bowel incontinence, constipation, diarrhea, nausea and vomiting.       No bowel or bladder incontinence  Genitourinary: Negative for bladder incontinence, difficulty urinating (no incontinence), dysuria and hematuria.  Musculoskeletal: Positive for back pain. Negative for arthralgias and myalgias.  Skin: Negative for color change.  Allergic/Immunologic: Negative for immunocompromised state.  Neurological: Negative for tingling, weakness, numbness and paresthesias.  Psychiatric/Behavioral: Negative for confusion.   All other systems reviewed and are negative for acute change except as noted in the HPI.   Physical Exam Updated Vital Signs BP 119/80 (BP Location: Left Arm)   Pulse 99   Temp 98.3 F (36.8 C) (Oral)   Resp 16   Ht 5\' 2"  (1.575 m)   Wt 159 lb (72.1 kg)   LMP 07/07/2016   SpO2 99%   BMI 29.08 kg/m   Physical Exam  Constitutional: She is oriented to person, place, and time. Vital signs are normal. She appears  well-developed and well-nourished.  Non-toxic appearance. No distress.  Afebrile, nontoxic, NAD  HENT:  Head: Normocephalic and atraumatic.  Mouth/Throat: Mucous membranes are normal.  Eyes: Conjunctivae and EOM are normal. Right eye exhibits no discharge. Left eye exhibits no discharge.  Neck: Normal range of motion. Neck supple.  Cardiovascular: Normal rate and intact distal pulses.   Pulmonary/Chest: Effort normal. No respiratory distress.  Abdominal: Normal appearance. She exhibits no distension.  Musculoskeletal: Normal range of motion.       Thoracic back: She exhibits tenderness and spasm. She exhibits normal range of motion, no bony tenderness, no swelling and no deformity.       Lumbar back: She exhibits tenderness  and spasm. She exhibits normal range of motion, no bony tenderness, no swelling and no deformity.  Thoracic and lumbar spinal levals with FROM intact without spinous process TTP, no bony stepoffs or deformities, with mild bilateral paraspinous muscle TTP and muscle spasms. Strength and sensation grossly intact in all extremities, negative SLR bilaterally, gait steady and nonantalgic. No overlying skin changes. Distal pulses intact.  Neurological: She is alert and oriented to person, place, and time. She has normal strength. No sensory deficit.  Skin: Skin is warm, dry and intact. No rash noted.  Psychiatric: She has a normal mood and affect. Her behavior is normal.  Nursing note and vitals reviewed.    ED Treatments / Results  DIAGNOSTIC STUDIES: Oxygen Saturation is 99% on RA, normal by my interpretation.   COORDINATION OF CARE: 3:14 PM- Advised pt to follow up with PCP. Will prescribe Robaxin and Mobic. Encouraged pt to use OTC Tylenol and heat therapy. Pt verbalizes understanding and agrees to plan.  Medications  ketorolac (TORADOL) 30 MG/ML injection 30 mg (not administered)    Labs (all labs ordered are listed, but only abnormal results are displayed) Labs Reviewed - No data to display  EKG  EKG Interpretation None       Radiology No results found.   Thoracic Xray 06/27/16 Study Result  CLINICAL DATA:  Pt c/o back pain x 1 year, mainly in the lower part. Pt states she does a lot of heavy lifting. No known injuries.  EXAM: THORACIC SPINE 2 VIEWS  COMPARISON:  None.  FINDINGS: Alignment is within normal limits. Bone mineralization is normal. No fracture line or displaced fracture fragment seen. No acute appearing cortical irregularity or osseous lesion. Visualized paravertebral soft tissues are unremarkable.  IMPRESSION: Negative.   Electronically Signed   By: Bary RichardStan  Maynard M.D.   On: 06/27/2016 16:39    Lumbar Xray 06/27/16 Study Result  CLINICAL DATA:   Pt c/o back pain x 1 year, mainly in the lower part. Pt states she does a lot of heavy lifting. No known injuries.  EXAM: LUMBAR SPINE - 2-3 VIEW  COMPARISON:  None.  FINDINGS: Minimal levoscoliosis of the lower lumbar spine, possibly accentuated by patient positioning. Alignment otherwise normal. Bone mineralization is normal. No fracture line or displaced fracture fragment. No acute appearing cortical irregularity or osseous lesion. Disc spaces are well preserved throughout. Upper sacrum appears normal.  Cholecystectomy clips in the right upper quadrant. Visualized paravertebral soft tissues are otherwise unremarkable.  IMPRESSION: 1. Minimal levoscoliosis of the lower lumbar spine. 2. No acute findings.   Electronically Signed   By: Bary RichardStan  Maynard M.D.   On: 06/27/2016 16:40    Procedures Procedures (including critical care time)  Medications Ordered in ED Medications  ketorolac (TORADOL) 30 MG/ML injection 30 mg (not administered)  Initial Impression / Assessment and Plan / ED Course  I have reviewed the triage vital signs and the nursing notes.  Pertinent labs & imaging results that were available during my care of the patient were reviewed by me and considered in my medical decision making (see chart for details).     26 y.o. female here with acute on chronic low back pain. On exam, mild diffuse b/l paraspinous muscle TTP and spasm in thoracic and lumbar spinal levels; no midline spinal tenderness. No red flag s/s of low back pain. No s/s of central cord compression or cauda equina. Lower extremities are neurovascularly intact and patient is ambulating without difficulty. No urinary complaints. Already had neg T/L spine imaging (levoscoliosis but otherwise neg). Doubt need for imaging/labs, likely muscular etiology however given duration of symptoms, may need to consider further outpatient work up including possible PT eval or consideration of MRI. Advised  that she should f/up with her PCP regarding this, doubt need for emergent MRI at this time.  Patient was counseled on back pain precautions and told to do activity as tolerated but do not lift, push, or pull heavy objects more than 10 pounds for the next week. Patient counseled to use ice or heat on back for no longer than 15 minutes every hour.   Rx given for muscle relaxer and counseled on proper use of muscle relaxant medication. Urged patient not to drink alcohol, drive, or perform any other activities that requires focus while taking these medications. Rx for mobic given. Advised tylenol use as well.  Patient urged to follow-up with PCP if pain does not improve with treatment and rest or if pain becomes recurrent. Urged to return with worsening severe pain, loss of bowel or bladder control, trouble walking. The patient verbalizes understanding and agrees with the plan.   I personally performed the services described in this documentation, which was scribed in my presence. The recorded information has been reviewed and is accurate.    Final Clinical Impressions(s) / ED Diagnoses   Final diagnoses:  Chronic bilateral back pain, unspecified back location  Muscle spasm of back    New Prescriptions New Prescriptions   MELOXICAM (MOBIC) 15 MG TABLET    Take 1 tablet (15 mg total) by mouth daily. TAKE WITH MEALS   METHOCARBAMOL (ROBAXIN) 500 MG TABLET    Take 1 tablet (500 mg total) by mouth every 8 (eight) hours as needed for muscle spasms.     [address], [place], [place] 08/06/16 1530    Charlynne Pander, MD 08/08/16 [phone]

## 2016-08-06 NOTE — Discharge Instructions (Signed)
Back Pain: Your back pain should be treated with medicines such as ibuprofen or aleve and this back pain should get better over the next 2 weeks.  However if you develop severe or worsening pain, low back pain with fever, numbness, weakness or inability to walk or urinate, you should return to the ER immediately.  Please follow up with your doctor this week for a recheck if still having symptoms.  Avoid heavy lifting over 10 pounds over the next two weeks.  Low back pain is discomfort in the lower back that may be due to injuries to muscles and ligaments around the spine.  Occasionally, it may be caused by a a problem to a part of the spine called a disc.  The pain may last several days or a week;  However, most patients get completely well in 4 weeks.  Self - care:  The application of heat can help soothe the pain.  Maintaining your daily activities, including walking, is encourged, as it will help you get better faster than just staying in bed. Perform gentle stretching as discussed. Drink plenty of fluids.  Medications are also useful to help with pain control.  A commonly prescribed medication includes over the counter tylenol. Take as directed on the bottle.  Non steroidal anti inflammatory medications including mobic;  These medications help both pain and swelling and are very useful in treating back pain.  They should be taken with food, as they can cause stomach upset, and more seriously, stomach bleeding.  You will not need to take additional over the counter NSAIDs like ibuprofen or aleve as Mobic takes the place of those medications.   Muscle relaxants:  These medications can help with muscle tightness that is a cause of lower back pain.  Most of these medications can cause drowsiness, and it is not safe to drive or use dangerous machinery while taking them.  SEEK IMMEDIATE MEDICAL ATTENTION IF: New numbness, tingling, weakness, or problem with the use of your arms or legs.  Severe back  pain not relieved with medications.  Difficulty with or loss of control of your bowel or bladder control.  Increasing pain in any areas of the body (such as chest or abdominal pain).  Shortness of breath, dizziness or fainting.  Nausea (feeling sick to your stomach), vomiting, fever, or sweats.  You will need to follow up with  Your primary healthcare provider in 1-2 weeks for reassessment.  If you do not have a doctor see the list below.

## 2016-08-06 NOTE — ED Triage Notes (Signed)
Pt c/o lower back pain that radiates up her back for past month. States she has been seen at [facility] c/o.

## 2016-08-07 ENCOUNTER — Ambulatory Visit: Payer: BLUE CROSS/BLUE SHIELD | Attending: Family Medicine | Admitting: Family Medicine

## 2016-08-07 ENCOUNTER — Encounter: Payer: Self-pay | Admitting: Family Medicine

## 2016-08-07 VITALS — BP 110/74 | HR 90 | Temp 98.4°F | Resp 18 | Ht 65.0 in | Wt 160.0 lb

## 2016-08-07 DIAGNOSIS — G8929 Other chronic pain: Secondary | ICD-10-CM | POA: Diagnosis not present

## 2016-08-07 DIAGNOSIS — Z299 Encounter for prophylactic measures, unspecified: Secondary | ICD-10-CM

## 2016-08-07 DIAGNOSIS — R200 Anesthesia of skin: Secondary | ICD-10-CM | POA: Insufficient documentation

## 2016-08-07 DIAGNOSIS — R110 Nausea: Secondary | ICD-10-CM | POA: Diagnosis not present

## 2016-08-07 DIAGNOSIS — F419 Anxiety disorder, unspecified: Secondary | ICD-10-CM | POA: Insufficient documentation

## 2016-08-07 DIAGNOSIS — F329 Major depressive disorder, single episode, unspecified: Secondary | ICD-10-CM | POA: Diagnosis not present

## 2016-08-07 DIAGNOSIS — Z79899 Other long term (current) drug therapy: Secondary | ICD-10-CM | POA: Diagnosis not present

## 2016-08-07 DIAGNOSIS — Z7982 Long term (current) use of aspirin: Secondary | ICD-10-CM | POA: Diagnosis not present

## 2016-08-07 DIAGNOSIS — M5442 Lumbago with sciatica, left side: Secondary | ICD-10-CM | POA: Insufficient documentation

## 2016-08-07 MED ORDER — METHOCARBAMOL 500 MG PO TABS: 500.0000 mg | ORAL_TABLET | Freq: Three times a day (TID) | ORAL | 0 refills | Status: DC | PRN

## 2016-08-07 MED ORDER — LORAZEPAM 2 MG PO TABS: 1.0000 mg | ORAL_TABLET | Freq: Once | ORAL | 0 refills | Status: AC

## 2016-08-07 MED ORDER — MELOXICAM 15 MG PO TABS: 15.0000 mg | ORAL_TABLET | Freq: Every day | ORAL | 0 refills | Status: DC

## 2016-08-07 MED ORDER — PREDNISONE 10 MG PO TABS: ORAL_TABLET | ORAL | 0 refills | Status: DC

## 2016-08-07 MED FILL — METHOCARBAMOL 500 MG TABLET: 500 | 5 days supply | Qty: 15 | Fill #0

## 2016-08-07 MED FILL — MELOXICAM 15 MG TABLET: 15 | 30 days supply | Qty: 30 | Fill #0

## 2016-08-07 MED FILL — predniSONE 10 MG TABS: 10 | 6 days supply | Qty: 21 | Fill #0

## 2016-08-07 NOTE — Progress Notes (Signed)
Subjective:  Patient ID: Tanya Sanchez, female    DOB: [birth year]  Age: 26 y.o. MRN: [id number]  CC: Back Pain   HPI Tanya Sanchez presents for    Back Pain: Patient presents for presents evaluation of low back problems. Recent history of ED visit 08/06/2016. Works a Nature conservation officer at [employer]. Reports history of heavy lifting  Symptoms have been present for 2 years and include numbness in burning sensation that radiats to bilateral shoulder blades. Initial inciting event: none. Reports nausea with back pain symptoms this morning. Reports back pain 9/10 at worst. She denies any bowel or bladder incontinence. Alleviating factors identifiable by patient are recumbency. Exacerbating factors identifiable by patient are bending forwards, sitting and walking. Treatments so far initiated by patient: OTC ibuprofen and back brace. Previous lower back problems: yes. Previous workup: xray. Previous treatments: prescription NSAID'S and opioid medications. Reports minimal relief with muscle relaxants and anti-inflammatories.   Anxiety and depression: History of anxiety and depression. She denies any SI/HI. She declines medications and counseling referral resources at this time.     Outpatient Medications Prior to Visit  Medication Sig Dispense Refill  . acetaminophen-codeine (TYLENOL #3) 300-30 MG tablet Take 1 tablet by mouth every 8 (eight) hours as needed for severe pain. 30 tablet 0  . amitriptyline (ELAVIL) 25 MG tablet Take 1 tablet (25 mg total) by mouth at bedtime. 30 tablet 1  . aspirin-acetaminophen-caffeine (EXCEDRIN MIGRAINE) 250-250-65 MG tablet Take 2 tablets by mouth every 6 (six) hours as needed for headache (pain).    . benzonatate (TESSALON) 100 MG capsule Take 1 capsule (100 mg total) by mouth every 8 (eight) hours. (Patient not taking: Reported on 06/27/2016) 21 capsule 0  . cyclobenzaprine (FLEXERIL) 10 MG tablet Take 1 tablet (10 mg total) by mouth every 8 (eight) hours as  needed for muscle spasms. 20 tablet 0  . HYDROcodone-acetaminophen (NORCO/VICODIN) 5-325 MG tablet Take 1-2 tablets by mouth every 4 (four) hours as needed for severe pain. 12 tablet 0  . ibuprofen (ADVIL,MOTRIN) 800 MG tablet Take 1 tablet (800 mg total) by mouth every 8 (eight) hours as needed for headache or moderate pain (Take with food.). 40 tablet 0  . naproxen sodium (ANAPROX DS) 550 MG tablet Take 1 tablet (550 mg total) by mouth 2 (two) times daily with a meal. (Patient not taking: Reported on 06/27/2016) 20 tablet 0  . pseudoephedrine (SUDAFED) 30 MG tablet Take 1 tablet (30 mg total) by mouth every 6 (six) hours as needed for congestion. (Patient not taking: Reported on 06/27/2016) 30 tablet 0  . meloxicam (MOBIC) 15 MG tablet Take 1 tablet (15 mg total) by mouth daily. TAKE WITH MEALS 30 tablet 0  . meloxicam (MOBIC) 7.5 MG tablet Take 1 tablet (7.5 mg total) by mouth daily. 30 tablet 1  . methocarbamol (ROBAXIN) 500 MG tablet Take 1 tablet (500 mg total) by mouth every 8 (eight) hours as needed for muscle spasms. 15 tablet 0   No facility-administered medications prior to visit.     ROS Review of Systems  Constitutional: Negative.   Respiratory: Negative.   Cardiovascular: Negative.   Gastrointestinal: Negative.   Musculoskeletal: Positive for back pain.  Psychiatric/Behavioral: Negative.  Negative for suicidal ideas.   Objective:  BP 110/74 (BP Location: Left Arm, Patient Position: Sitting, Cuff Size: Normal)   Pulse 90   Temp 98.4 F (36.9 C) (Oral)   Resp 18   Ht 5\' 5"  (1.651 m)   Wt 160  lb (72.6 kg)   SpO2 100%   BMI 26.63 kg/m   BP/Weight 08/07/2016 08/06/2016 07/31/2016  Systolic BP 110 113 121  Diastolic BP 74 89 79  Wt. (Lbs) 160 159 159.2  BMI 26.63 29.08 29.12    Physical Exam  Constitutional: She is oriented to person, place, and time. She appears well-developed and well-nourished.  Cardiovascular: Normal rate, regular rhythm, normal heart sounds and intact  distal pulses.   Pulmonary/Chest: Effort normal and breath sounds normal.  Abdominal: Soft. Bowel sounds are normal.  Musculoskeletal: Normal range of motion.       Lumbar back: She exhibits pain.  Neurological: She is alert and oriented to person, place, and time. She has normal reflexes.  Skin: Skin is warm and dry.  Psychiatric: Her speech is normal. Her affect is blunt. She is slowed. She expresses no homicidal and no suicidal ideation. She expresses no suicidal plans and no homicidal plans.  Nursing note and vitals reviewed.  Assessment & Plan:   Problem List Items Addressed This Visit    None    Visit Diagnoses    Chronic bilateral low back pain with left-sided sciatica    -  Primary   Relevant Medications   predniSONE (DELTASONE) 10 MG tablet   methocarbamol (ROBAXIN) 500 MG tablet   meloxicam (MOBIC) 15 MG tablet   Other Relevant Orders   MR Lumbar Spine Wo Contrast   Preventive medication therapy needed             Pre-med prescribed for anxiety for upcoming MRI imaging.        Relevant Medications              LORazepam (ATIVAN) 2 MG tablet      Meds ordered this encounter  Medications  . predniSONE (DELTASONE) 10 MG tablet    Sig: Take 6 tablets by mouth day one, 5 tablets day two, 4 tablets day three, 3 tablets day four, 2 tablets day five, 1 tablet day six.    Dispense:  21 tablet    Refill:  0    Order Specific Question:   Supervising Provider    Answer:   Quentin AngstJEGEDE, OLUGBEMIGA E [id number]]  . methocarbamol (ROBAXIN) 500 MG tablet    Sig: Take 1 tablet (500 mg total) by mouth every 8 (eight) hours as needed for muscle spasms.    Dispense:  20 tablet    Refill:  0    Order Specific Question:   Supervising Provider    Answer:   Quentin AngstJEGEDE, OLUGBEMIGA E [id number]]  . meloxicam (MOBIC) 15 MG tablet    Sig: Take 1 tablet (15 mg total) by mouth daily. TAKE WITH MEALS    Dispense:  30 tablet    Refill:  0    Order Specific Question:   Supervising Provider    Answer:    Quentin AngstJEGEDE, OLUGBEMIGA E [id number]]  . LORazepam (ATIVAN) 2 MG tablet    Sig: Take 0.5 tablets (1 mg total) by mouth once. May give another 0.5 tablet (1mg  total) by mouth if needed for anxiety.    Dispense:  1 tablet    Refill:  0    Prior to imaging medication.    Order Specific Question:   Supervising Provider    Answer:   Quentin AngstJEGEDE, OLUGBEMIGA E [[id number]]     Tanya BarkMandesia R Hairston FNP

## 2016-08-07 NOTE — Patient Instructions (Addendum)

## 2016-08-14 ENCOUNTER — Ambulatory Visit (HOSPITAL_COMMUNITY): Admission: RE | Admit: 2016-08-14 | Payer: BLUE CROSS/BLUE SHIELD | Source: Ambulatory Visit

## 2016-08-24 ENCOUNTER — Ambulatory Visit (HOSPITAL_COMMUNITY)
Admission: RE | Admit: 2016-08-24 | Discharge: 2016-08-24 | Disposition: A | Discharge status: Home / Self Care | Payer: BLUE CROSS/BLUE SHIELD | Source: Ambulatory Visit | Attending: Family Medicine | Admitting: Family Medicine

## 2016-08-24 ENCOUNTER — Telehealth: Payer: Self-pay | Admitting: Family Medicine

## 2016-08-24 ENCOUNTER — Other Ambulatory Visit: Payer: Self-pay | Admitting: Family Medicine

## 2016-08-24 DIAGNOSIS — M5126 Other intervertebral disc displacement, lumbar region: Secondary | ICD-10-CM | POA: Insufficient documentation

## 2016-08-24 DIAGNOSIS — G8929 Other chronic pain: Secondary | ICD-10-CM | POA: Diagnosis not present

## 2016-08-24 DIAGNOSIS — M5442 Lumbago with sciatica, left side: Secondary | ICD-10-CM | POA: Diagnosis present

## 2016-08-24 DIAGNOSIS — M8938 Hypertrophy of bone, other site: Secondary | ICD-10-CM | POA: Insufficient documentation

## 2016-08-24 NOTE — Telephone Encounter (Signed)
Good Morning  [employer] insurance denied the procedure  Of MR Lumbar Spine Wo Contrast Her appointment is today 08-24-16 @ 12pm . They need to talk to a provider .Please, call [phone] and tracking # [id number] . Thank you  

## 2016-08-24 NOTE — Telephone Encounter (Signed)
Good Morning  [employer] denied the procedure  Of MR Lumbar Spine Wo Contrast Her appointment is today 08-24-16 @ 12pm . They need to talk to a provider .Please, call [phone] and tracking # [id number] . Thank you

## 2016-08-24 NOTE — Telephone Encounter (Signed)
Noted  . Sent notes for review  Patient had the mri lumbar spine wo contrast . Thank You

## 2016-08-24 NOTE — Telephone Encounter (Signed)
Called and spoke with a physician on behalf of [employer] regarding denial. They are requesting that clinical notes be faxed over in regards to patient's chronic back pain. Once reviewed if she meets criteria procedure will be approved if not then denial letter will be sent. Fax number is [phone]. They ask that tracking number be provided. # [id number]. Please follow up.

## 2016-08-24 NOTE — Telephone Encounter (Signed)
Thanks

## 2016-08-26 ENCOUNTER — Emergency Department (HOSPITAL_COMMUNITY)
Admission: EM | Admit: 2016-08-26 | Discharge: 2016-08-26 | Disposition: A | Discharge status: Home / Self Care | Payer: BLUE CROSS/BLUE SHIELD | Attending: Emergency Medicine | Admitting: Emergency Medicine

## 2016-08-26 ENCOUNTER — Encounter (HOSPITAL_COMMUNITY): Payer: Self-pay | Admitting: Emergency Medicine

## 2016-08-26 ENCOUNTER — Emergency Department (HOSPITAL_COMMUNITY): Payer: BLUE CROSS/BLUE SHIELD

## 2016-08-26 ENCOUNTER — Encounter (HOSPITAL_COMMUNITY): Payer: Self-pay | Admitting: *Deleted

## 2016-08-26 DIAGNOSIS — R0789 Other chest pain: Secondary | ICD-10-CM | POA: Insufficient documentation

## 2016-08-26 DIAGNOSIS — Z79899 Other long term (current) drug therapy: Secondary | ICD-10-CM | POA: Insufficient documentation

## 2016-08-26 DIAGNOSIS — M546 Pain in thoracic spine: Principal | ICD-10-CM

## 2016-08-26 DIAGNOSIS — F902 Attention-deficit hyperactivity disorder, combined type: Secondary | ICD-10-CM | POA: Insufficient documentation

## 2016-08-26 DIAGNOSIS — G8929 Other chronic pain: Secondary | ICD-10-CM

## 2016-08-26 LAB — CBC
HCT: 42.4 % (ref 36.0–46.0)
HEMOGLOBIN: 14.9 g/dL (ref 12.0–15.0)
MCH: 29.7 pg (ref 26.0–34.0)
MCHC: 35.1 g/dL (ref 30.0–36.0)
MCV: 84.6 fL (ref 78.0–100.0)
Platelets: 291 10*3/uL (ref 150–400)
RBC: 5.01 MIL/uL (ref 3.87–5.11)
RDW: 12 % (ref 11.5–15.5)
WBC: 7.8 10*3/uL (ref 4.0–10.5)

## 2016-08-26 LAB — I-STAT TROPONIN, ED: TROPONIN I, POC: 0 ng/mL (ref 0.00–0.08)

## 2016-08-26 LAB — BASIC METABOLIC PANEL
Anion gap: 8 (ref 5–15)
BUN: 7 mg/dL (ref 6–20)
CALCIUM: 9.9 mg/dL (ref 8.9–10.3)
CO2: 25 mmol/L (ref 22–32)
Chloride: 106 mmol/L (ref 101–111)
Creatinine, Ser: 0.75 mg/dL (ref 0.44–1.00)
GFR calc Af Amer: >60 mL/min (ref >60)
GFR calc non Af Amer: >60 mL/min (ref >60)
Glucose, Bld: 117 mg/dL — ABNORMAL HIGH (ref 65–99)
Potassium: 4 mmol/L (ref 3.5–5.1)
Sodium: 139 mmol/L (ref 135–145)

## 2016-08-26 NOTE — ED Notes (Signed)
Pt requesting work note stating "no heavy lifting" AubreyJeff, [place] aware and is at the bedside with pt at this time

## 2016-08-26 NOTE — ED Notes (Signed)
ED Provider at bedside. 

## 2016-08-26 NOTE — ED Notes (Signed)
Pt sts she has tried multiple pain medications without relief. She also had an MRI scan done two days ago and is waiting for results. Pt has a refferal for an ortho doctor but has not yet scheduled an appointment

## 2016-08-26 NOTE — ED Provider Notes (Signed)
[facility]-EMERGENCY DEPT Provider Note   CSN: [id number] Arrival date & time: 08/26/16  1431  By signing my name below, I, Linna Darner, attest that this documentation has been prepared under the direction and in the presence of [employer], PA-C. Electronically Signed: Linna Darner, Scribe. 08/26/2016. 4:17 PM.  History   Chief Complaint Chief Complaint  Patient presents with  . Back Pain   The history is provided by the patient. No language interpreter was used.    HPI Comments:   Tanya Sanchez is a 26 y.o. female who presents to the Emergency Department complaining of constant, gradually worsening, severe pain in her thoracic region for several months. Patient describes the pain as sharp and shooting and states it intermittently radiates into her right upper extremity, scapulae, and neck as well as down her lower extremities. She also reports some associated transient episodes of tingling in her right upper extremity. She notes her back pain is exacerbated by movement. She was evaluated by her PCP on 5/18 for the same complaint and was discharged with Tylenol #3, Robaxin, Deltasone, and Mobic which she has taken without improvement of her pain. Patient had a MRI taken during her PCP visit on 5/18 which was "nearly normal...with minimal findings of debatable significance". Patient has a years-long h/o lower back pain but specifically notes that her current onset of back pain is different. 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 She denies fevers, chills, or any other associated symptoms.  Past Medical History:  Diagnosis Date  . ADHD (attention deficit hyperactivity disorder)   . Gallstones   . Panic attack 2016    Patient Active Problem List   Diagnosis Date Noted  . Symptomatic cholelithiasis 10/14/2014  . Epigastric abdominal pain 10/11/2014  . Chronic tension headaches 10/11/2014  . Vitamin D  deficiency 07/31/2014  . Panic attacks 07/30/2014  . Poor dentition 07/30/2014  . Allergic rhinitis 07/30/2014    Past Surgical History:  Procedure Laterality Date  . CHOLECYSTECTOMY N/A 10/15/2014   Procedure: LAPAROSCOPIC CHOLECYSTECTOMY WITH INTRAOPERATIVE CHOLANGIOGRAM;  Surgeon: Jimmye Norman, MD;  Location: [facility] OR;  Service: General;  Laterality: N/A;    OB History    No data available       Home Medications    Prior to Admission medications   Medication Sig Start Date End Date Taking? Authorizing Provider  acetaminophen-codeine (TYLENOL #3) 300-30 MG tablet Take 1 tablet by mouth every 8 (eight) hours as needed for severe pain. 07/31/16   Lizbeth Bark, FNP  amitriptyline (ELAVIL) 25 MG tablet Take 1 tablet (25 mg total) by mouth at bedtime. 06/05/16   Lizbeth Bark, FNP  aspirin-acetaminophen-caffeine (EXCEDRIN MIGRAINE) [phone] MG tablet Take 2 tablets by mouth every 6 (six) hours as needed for headache (pain).    [provider]  benzonatate (TESSALON) 100 MG capsule Take 1 capsule (100 mg total) by mouth every 8 (eight) hours. Patient not taking: Reported on 06/27/2016 02/05/16   Janne Napoleon, NP  cyclobenzaprine (FLEXERIL) 10 MG tablet Take 1 tablet (10 mg total) by mouth every 8 (eight) hours as needed for muscle spasms. 07/31/16   Lizbeth Bark, FNP  HYDROcodone-acetaminophen (NORCO/VICODIN) 5-325 MG tablet Take 1-2 tablets by mouth every 4 (four) hours as needed for severe pain. 06/27/16   Shaune Pollack, MD  ibuprofen (ADVIL,MOTRIN) 800 MG tablet Take 1 tablet (800 mg total) by mouth every 8 (eight) hours as needed  for headache or moderate pain (Take with food.). 06/05/16   Lizbeth BarkHairston, Mandesia R, FNP  meloxicam (MOBIC) 15 MG tablet Take 1 tablet (15 mg total) by mouth daily. TAKE WITH MEALS 08/07/16   Lizbeth BarkHairston, Mandesia R, FNP  methocarbamol (ROBAXIN) 500 MG tablet Take 1 tablet (500 mg total) by mouth every 8 (eight) hours as needed for muscle  spasms. 08/07/16   Lizbeth BarkHairston, Mandesia R, FNP  naproxen sodium (ANAPROX DS) 550 MG tablet Take 1 tablet (550 mg total) by mouth 2 (two) times daily with a meal. Patient not taking: Reported on 06/27/2016 05/03/16   Janne NapoleonNeese, Hope M, NP  predniSONE (DELTASONE) 10 MG tablet Take 6 tablets by mouth day one, 5 tablets day two, 4 tablets day three, 3 tablets day four, 2 tablets day five, 1 tablet day six. 08/07/16   Lizbeth BarkHairston, Mandesia R, FNP  pseudoephedrine (SUDAFED) 30 MG tablet Take 1 tablet (30 mg total) by mouth every 6 (six) hours as needed for congestion. Patient not taking: Reported on 06/27/2016 02/05/16   Janne NapoleonNeese, Hope M, NP    Family History Family History  Problem Relation Age of Onset  . Heart disease Maternal Grandmother   . Hypertension Maternal Grandmother   . Depression Maternal Grandmother   . Arthritis Maternal Grandmother   . Depression Mother   . Cancer Mother   . Bladder Cancer Mother   . Depression Sister     Social History Social History  Substance Use Topics  . Smoking status: Never Smoker  . Smokeless tobacco: Never Used  . Alcohol use No     Allergies   Patient has no known allergies.   Review of Systems Review of Systems  Constitutional: Negative for chills and fever.  Musculoskeletal: Positive for arthralgias (secondary to radiation), back pain, myalgias (secondary to radiation) and neck pain (secondary to radiation).  Neurological: Positive for numbness (tingling).  All other systems reviewed and are negative.  Physical Exam Updated Vital Signs BP 124/84 (BP Location: Right Arm)   Pulse 91   Temp 98.5 F (36.9 C) (Oral)   Resp 16   Ht 5\' 5"  (1.651 m)   Wt 71.2 kg (157 lb)   LMP 08/16/2016   SpO2 100%   BMI 26.13 kg/m   Physical Exam  Constitutional: She is oriented to person, place, and time. She appears well-developed and well-nourished. No distress.  HENT:  Head: Normocephalic and atraumatic.  Eyes: Conjunctivae and EOM are normal.  Neck: Neck  supple. No tracheal deviation present.  Cardiovascular: Normal rate.   Pulmonary/Chest: Effort normal. No respiratory distress.  Musculoskeletal: Normal range of motion.  Tender to palpation of the musculature of the thoracic and lumbar soft tissue no CT or L-spine abnormalities noted distal extremity sensation and strength and motor function intact full active range of motion of the extremities ambulance without difficulty straight leg negative  Neurological: She is alert and oriented to person, place, and time.  Skin: Skin is warm and dry.  Psychiatric: She has a normal mood and affect. Her behavior is normal.  Nursing note and vitals reviewed.  ED Treatments / Results  Labs (all labs ordered are listed, but only abnormal results are displayed) Labs Reviewed - No data to display  EKG  EKG Interpretation None       Radiology No results found.  Procedures Procedures (including critical care time)  DIAGNOSTIC STUDIES: Oxygen Saturation is 99% on RA, normal by my interpretation.    COORDINATION OF CARE: 4:11 PM Discussed treatment plan  with pt at bedside and pt agreed to plan.  Medications Ordered in ED Medications - No data to display   Initial Impression / Assessment and Plan / ED Course  I have reviewed the triage vital signs and the nursing notes.  Pertinent labs & imaging results that were available during my care of the patient were reviewed by me and considered in my medical decision making (see chart for details).      Final Clinical Impressions(s) / ED Diagnoses   Final diagnoses:  Chronic bilateral thoracic back pain    26 year old female presents today with complaints of chronic back pain.  Patient has no red flags here today.  She has recent imaging including MRI which shows no significant finding.  No need for further imaging or evaluation or management here in the ED.  She referred to specialist for ongoing care, strict return precautions given.  She  verbalized understanding and agreement to today's plan had no further questions or concerns.  New Prescriptions Discharge Medication List as of 08/26/2016  4:38 PM     I personally performed the services described in this documentation, which was scribed in my presence. The recorded information has been reviewed and is accurate.   Eyvonne Mechanic, PA-C 08/26/16 2059    Nira Conn, MD 08/29/16 [phone]

## 2016-08-26 NOTE — ED Triage Notes (Signed)
Pt c/o sudden onset chest pain with tingling to R arm about 45 minutes ago when seeing her mom in the hospital. Pt states she was just discharged from ED (seen for chronic back pain) a few hours ago. Pt reports the pain was in her spine and then radiated to her chest. She now c/o tingling in both arms as well as nausea and SOB. Resp currently e/u, skin warm/dry. Pt reports having had chest pain before, but never this bad. Ambulatory into triage.

## 2016-08-26 NOTE — Discharge Instructions (Signed)
Please read attached information. If you experience any new or worsening signs or symptoms please return to the emergency room for evaluation. Please follow-up with your primary care provider or specialist as discussed.  °

## 2016-08-26 NOTE — ED Triage Notes (Addendum)
PT is here for increasing back pain that is now radiating up into her neck and giving her such bad headaches she hasn't eaten for 3 days.  Muscle relaxers and pain meds aren't working, per pt.

## 2016-08-27 ENCOUNTER — Emergency Department (HOSPITAL_COMMUNITY)
Admission: EM | Admit: 2016-08-27 | Discharge: 2016-08-27 | Disposition: A | Discharge status: Home / Self Care | Payer: BLUE CROSS/BLUE SHIELD | Attending: Emergency Medicine | Admitting: Emergency Medicine

## 2016-08-27 DIAGNOSIS — R0789 Other chest pain: Secondary | ICD-10-CM

## 2016-08-27 LAB — I-STAT TROPONIN, ED: TROPONIN I, POC: 0 ng/mL (ref 0.00–0.08)

## 2016-08-27 MED ORDER — ONDANSETRON 4 MG PO TBDP
8.0000 mg | ORAL_TABLET | Freq: Once | ORAL | Status: AC
  Administered 2016-08-27: 8 mg via ORAL
  Filled 2016-08-27: qty 2

## 2016-08-27 MED ORDER — NAPROXEN 500 MG PO TABS: 500.0000 mg | ORAL_TABLET | Freq: Two times a day (BID) | ORAL | 0 refills | Status: DC

## 2016-08-27 MED ORDER — KETOROLAC TROMETHAMINE 30 MG/ML IJ SOLN
60.0000 mg | Freq: Once | INTRAMUSCULAR | Status: AC
  Administered 2016-08-27: 60 mg via INTRAMUSCULAR
  Filled 2016-08-27: qty 2

## 2016-08-27 NOTE — ED Provider Notes (Signed)
[facility]-EMERGENCY DEPT Provider Note   CSN: [id number] Arrival date & time: 08/26/16  2005  By signing my name below, I, Tanya Sanchez, attest that this documentation has been prepared under the direction and in the presence of Tanya Lyons, MD. Electronically Signed: Cynda Sanchez, Scribe. 08/27/16. 1:38 AM.  History   Chief Complaint Chief Complaint  Patient presents with  . Chest Pain    HPI Comments: Tanya Sanchez is a 26 y.o. female with no pertinent past medical history, who presents to the Emergency Department complaining of sudden-onset chest pain that began earlier today. Patient states initially she began having severe lower back pain, she was seen here 6 hours ago and discharged home. Patient states 30 minutes after leaving her pain radiated to her chest with a throbbing sensation. Chest pain is at present. No diaphoresis. Patient states she does have increased stress due to her mother being in the hospital. Patient reports associated shortness of breath, bilateral hand tingling, nausea, and right arm numbness. No modifying or aggravating factors indicated. Patient denies any fever, chills, vomiting, diarrhea, abdominal pain, or any additional symptoms.   The history is provided by the patient. No language interpreter was used.    Past Medical History:  Diagnosis Date  . ADHD (attention deficit hyperactivity disorder)   . Gallstones   . Panic attack 2016    Patient Active Problem List   Diagnosis Date Noted  . Symptomatic cholelithiasis 10/14/2014  . Epigastric abdominal pain 10/11/2014  . Chronic tension headaches 10/11/2014  . Vitamin D deficiency 07/31/2014  . Panic attacks 07/30/2014  . Poor dentition 07/30/2014  . Allergic rhinitis 07/30/2014    Past Surgical History:  Procedure Laterality Date  . CHOLECYSTECTOMY N/A 10/15/2014   Procedure: LAPAROSCOPIC CHOLECYSTECTOMY WITH INTRAOPERATIVE CHOLANGIOGRAM;  Surgeon: Jimmye Norman, MD;  Location: [facility] OR;  Service:  General;  Laterality: N/A;    OB History    No data available       Home Medications    Prior to Admission medications   Medication Sig Start Date End Date Taking? Authorizing Provider  acetaminophen-codeine (TYLENOL #3) 300-30 MG tablet Take 1 tablet by mouth every 8 (eight) hours as needed for severe pain. 07/31/16   Lizbeth Bark, FNP  amitriptyline (ELAVIL) 25 MG tablet Take 1 tablet (25 mg total) by mouth at bedtime. 06/05/16   Lizbeth Bark, FNP  aspirin-acetaminophen-caffeine (EXCEDRIN MIGRAINE) [phone] MG tablet Take 2 tablets by mouth every 6 (six) hours as needed for headache (pain).    [provider]  benzonatate (TESSALON) 100 MG capsule Take 1 capsule (100 mg total) by mouth every 8 (eight) hours. Patient not taking: Reported on 06/27/2016 02/05/16   Janne Napoleon, NP  cyclobenzaprine (FLEXERIL) 10 MG tablet Take 1 tablet (10 mg total) by mouth every 8 (eight) hours as needed for muscle spasms. 07/31/16   Lizbeth Bark, FNP  HYDROcodone-acetaminophen (NORCO/VICODIN) 5-325 MG tablet Take 1-2 tablets by mouth every 4 (four) hours as needed for severe pain. 06/27/16   Shaune Pollack, MD  ibuprofen (ADVIL,MOTRIN) 800 MG tablet Take 1 tablet (800 mg total) by mouth every 8 (eight) hours as needed for headache or moderate pain (Take with food.). 06/05/16   Lizbeth Bark, FNP  meloxicam (MOBIC) 15 MG tablet Take 1 tablet (15 mg total) by mouth daily. TAKE WITH MEALS 08/07/16   Lizbeth Bark, FNP  methocarbamol (ROBAXIN) 500 MG tablet Take 1 tablet (500 mg total) by mouth every 8 (eight) hours as  needed for muscle spasms. 08/07/16   Lizbeth BarkHairston, Mandesia R, FNP  naproxen sodium (ANAPROX DS) 550 MG tablet Take 1 tablet (550 mg total) by mouth 2 (two) times daily with a meal. Patient not taking: Reported on 06/27/2016 05/03/16   Janne NapoleonNeese, Hope M, NP  predniSONE (DELTASONE) 10 MG tablet Take 6 tablets by mouth day one, 5 tablets day two, 4 tablets day three, 3  tablets day four, 2 tablets day five, 1 tablet day six. 08/07/16   Lizbeth BarkHairston, Mandesia R, FNP  pseudoephedrine (SUDAFED) 30 MG tablet Take 1 tablet (30 mg total) by mouth every 6 (six) hours as needed for congestion. Patient not taking: Reported on 06/27/2016 02/05/16   Janne NapoleonNeese, Hope M, NP    Family History Family History  Problem Relation Age of Onset  . Heart disease Maternal Grandmother   . Hypertension Maternal Grandmother   . Depression Maternal Grandmother   . Arthritis Maternal Grandmother   . Depression Mother   . Cancer Mother   . Bladder Cancer Mother   . Depression Sister     Social History Social History  Substance Use Topics  . Smoking status: Never Smoker  . Smokeless tobacco: Never Used  . Alcohol use No     Allergies   Patient has no known allergies.   Review of Systems Review of Systems  Constitutional: Negative for chills, diaphoresis and fever.  Respiratory: Positive for shortness of breath.   Cardiovascular: Positive for chest pain.  Gastrointestinal: Negative for abdominal pain, diarrhea, nausea and vomiting.  Neurological: Positive for numbness (left arm).  All other systems reviewed and are negative.    Physical Exam Updated Vital Signs BP 120/88 (BP Location: Right Arm)   Pulse 67   Temp 98.3 F (36.8 C) (Oral)   Resp 19   LMP 08/16/2016   SpO2 100%   Physical Exam  Constitutional: She is oriented to person, place, and time. She appears well-developed and well-nourished.  HENT:  Head: Normocephalic and atraumatic.  Mouth/Throat: Oropharynx is clear and moist.  Eyes: Pupils are equal, round, and reactive to light.  Neck: Normal range of motion. Neck supple.  Cardiovascular: Normal rate and regular rhythm.   No murmur heard. Pulmonary/Chest: Effort normal and breath sounds normal. No respiratory distress. She has no wheezes. She has no rales. She exhibits tenderness.  TTP over the anterior chest/sternum, reproducing her symptoms.     Abdominal: Soft. Bowel sounds are normal. She exhibits no distension. There is no tenderness.  Musculoskeletal: Normal range of motion.  Neurological: She is alert and oriented to person, place, and time.  Skin: Skin is warm and dry.  Psychiatric: She has a normal mood and affect.  Nursing note and vitals reviewed.    ED Treatments / Results  DIAGNOSTIC STUDIES: Oxygen Saturation is 100% on RA, normal by my interpretation.    COORDINATION OF CARE: 1:37 AM Discussed treatment plan with pt at bedside and pt agreed to plan, which includes Toradol and an EKG.   Labs (all labs ordered are listed, but only abnormal results are displayed) Labs Reviewed  BASIC METABOLIC PANEL - Abnormal; Notable for the following:       Result Value   Glucose, Bld 117 (*)    All other components within normal limits  CBC  I-STAT TROPOININ, ED    EKG  EKG Interpretation  Date/Time:  Wednesday August 26 2016 20:13:41 EDT Ventricular Rate:  94 PR Interval:  160 QRS Duration: 106 QT Interval:  362 QTC Calculation:  452 R Axis:   93 Text Interpretation:  Normal sinus rhythm Rightward axis Cannot rule out Anterior infarct , age undetermined Abnormal ECG Confirmed by Tanya Sanchez ([id number]) on 08/27/2016 1:27:12 AM       Radiology Dg Chest 2 View  Result Date: 08/26/2016 CLINICAL DATA:  26 year old with acute onset of chest pain and right upper extremity tingling associated with shortness of breath and nausea. Nonsmoker. EXAM: CHEST  2 VIEW COMPARISON:  02/05/2016, 11/22/2013 and earlier. FINDINGS: Cardiomediastinal silhouette unremarkable. Lungs clear. Bronchovascular markings normal. Pulmonary vascularity normal. No pneumothorax. No pleural effusions. Visualized bony thorax intact. No interval change. IMPRESSION: Normal examination. Electronically Signed   By: Hulan Saas M.D.   On: 08/26/2016 21:00    Procedures Procedures (including critical care time)  Medications Ordered in ED Medications -  No data to display   Initial Impression / Assessment and Plan / ED Course  I have reviewed the triage vital signs and the nursing notes.  Pertinent labs & imaging results that were available during my care of the patient were reviewed by me and considered in my medical decision making (see chart for details).  Patient presents here with complaints of chest discomfort that started while visiting her mother here in the hospital. It is sharp in nature and reproducible with palpation and atypical for cardiac pain. She has no cardiac risk factors. Chest x-ray is clear, EKG is normal, and troponin 2 were both negative. She was given IM Toradol and will be discharged with naproxen for what I believe to be a musculoskeletal etiology.  Final Clinical Impressions(s) / ED Diagnoses   Final diagnoses:  None    New Prescriptions New Prescriptions   No medications on file   I personally performed the services described in this documentation, which was scribed in my presence. The recorded information has been reviewed and is accurate.        Tanya Lyons, MD 08/27/16 [phone]

## 2016-08-27 NOTE — Discharge Instructions (Signed)
Naproxen as prescribed.  Return to the emergency department if symptoms significantly worsen or change.

## 2016-08-28 ENCOUNTER — Other Ambulatory Visit: Payer: Self-pay | Admitting: Family Medicine

## 2016-08-28 DIAGNOSIS — M5136 Other intervertebral disc degeneration, lumbar region: Secondary | ICD-10-CM

## 2016-08-28 DIAGNOSIS — M47816 Spondylosis without myelopathy or radiculopathy, lumbar region: Secondary | ICD-10-CM | POA: Insufficient documentation

## 2016-08-28 DIAGNOSIS — M5126 Other intervertebral disc displacement, lumbar region: Secondary | ICD-10-CM | POA: Insufficient documentation

## 2016-08-28 NOTE — Progress Notes (Unsigned)
   Subjective:  Patient ID: Tanya Sanchez, female    DOB: 09-11-1990  Age: 26 y.o. MRN: [id number]  CC: No chief complaint on file.   HPI Tanya Sanchez presents for ***  Outpatient Medications Prior to Visit  Medication Sig Dispense Refill  . acetaminophen-codeine (TYLENOL #3) 300-30 MG tablet Take 1 tablet by mouth every 8 (eight) hours as needed for severe pain. 30 tablet 0  . amitriptyline (ELAVIL) 25 MG tablet Take 1 tablet (25 mg total) by mouth at bedtime. 30 tablet 1  . aspirin-acetaminophen-caffeine (EXCEDRIN MIGRAINE) 250-250-65 MG tablet Take 2 tablets by mouth every 6 (six) hours as needed for headache (pain).    . benzonatate (TESSALON) 100 MG capsule Take 1 capsule (100 mg total) by mouth every 8 (eight) hours. (Patient not taking: Reported on 06/27/2016) 21 capsule 0  . cyclobenzaprine (FLEXERIL) 10 MG tablet Take 1 tablet (10 mg total) by mouth every 8 (eight) hours as needed for muscle spasms. 20 tablet 0  . HYDROcodone-acetaminophen (NORCO/VICODIN) 5-325 MG tablet Take 1-2 tablets by mouth every 4 (four) hours as needed for severe pain. 12 tablet 0  . ibuprofen (ADVIL,MOTRIN) 800 MG tablet Take 1 tablet (800 mg total) by mouth every 8 (eight) hours as needed for headache or moderate pain (Take with food.). 40 tablet 0  . meloxicam (MOBIC) 15 MG tablet Take 1 tablet (15 mg total) by mouth daily. TAKE WITH MEALS 30 tablet 0  . methocarbamol (ROBAXIN) 500 MG tablet Take 1 tablet (500 mg total) by mouth every 8 (eight) hours as needed for muscle spasms. 20 tablet 0  . naproxen (NAPROSYN) 500 MG tablet Take 1 tablet (500 mg total) by mouth 2 (two) times daily with a meal. 15 tablet 0  . naproxen sodium (ANAPROX DS) 550 MG tablet Take 1 tablet (550 mg total) by mouth 2 (two) times daily with a meal. (Patient not taking: Reported on 06/27/2016) 20 tablet 0  . predniSONE (DELTASONE) 10 MG tablet Take 6 tablets by mouth day one, 5 tablets day two, 4 tablets day three, 3 tablets  day four, 2 tablets day five, 1 tablet day six. 21 tablet 0  . pseudoephedrine (SUDAFED) 30 MG tablet Take 1 tablet (30 mg total) by mouth every 6 (six) hours as needed for congestion. (Patient not taking: Reported on 06/27/2016) 30 tablet 0   No facility-administered medications prior to visit.     ROS Review of Systems      Objective:  LMP 08/16/2016   BP/Weight 08/27/2016 08/26/2016 08/07/2016  Systolic BP 129 124 110  Diastolic BP 80 84 74  Wt. (Lbs) - 157 160  BMI - 26.13 26.63     Physical Exam   Assessment & Plan:   Problem List Items Addressed This Visit    None      No orders of the defined types were placed in this encounter.   Follow-up: No Follow-up on file.   Hartwell Vandiver R Mattheo Swindle

## 2016-08-30 ENCOUNTER — Emergency Department (HOSPITAL_COMMUNITY)
Admission: EM | Admit: 2016-08-30 | Discharge: 2016-08-30 | Disposition: A | Discharge status: Home / Self Care | Payer: BLUE CROSS/BLUE SHIELD | Attending: Emergency Medicine | Admitting: Emergency Medicine

## 2016-08-30 ENCOUNTER — Encounter (HOSPITAL_COMMUNITY): Payer: Self-pay | Admitting: Emergency Medicine

## 2016-08-30 DIAGNOSIS — Z79899 Other long term (current) drug therapy: Secondary | ICD-10-CM | POA: Insufficient documentation

## 2016-08-30 DIAGNOSIS — F909 Attention-deficit hyperactivity disorder, unspecified type: Secondary | ICD-10-CM | POA: Insufficient documentation

## 2016-08-30 DIAGNOSIS — L509 Urticaria, unspecified: Secondary | ICD-10-CM | POA: Diagnosis not present

## 2016-08-30 MED ORDER — FAMOTIDINE 20 MG PO TABS
20.0000 mg | ORAL_TABLET | Freq: Once | ORAL | Status: AC
  Administered 2016-08-30: 20 mg via ORAL
  Filled 2016-08-30: qty 1

## 2016-08-30 MED ORDER — DIPHENHYDRAMINE HCL 25 MG PO TABS: 25.0000 mg | ORAL_TABLET | Freq: Four times a day (QID) | ORAL | 0 refills | Status: DC

## 2016-08-30 MED ORDER — DIPHENHYDRAMINE HCL 50 MG/ML IJ SOLN
50.0000 mg | Freq: Once | INTRAMUSCULAR | Status: AC
  Administered 2016-08-30: 50 mg via INTRAVENOUS
  Filled 2016-08-30: qty 1

## 2016-08-30 MED ORDER — FAMOTIDINE 20 MG PO TABS: 20.0000 mg | ORAL_TABLET | Freq: Two times a day (BID) | ORAL | 0 refills | Status: DC

## 2016-08-30 NOTE — Discharge Instructions (Signed)
Take medications as prescribed, consider seeing an allergist if you have recurrent episodes, return for difficulty breathing, or difficulty swallowing

## 2016-08-30 NOTE — ED Triage Notes (Signed)
Pt to ED c/o sudden onset hives on arms/arm pit, and abdomen x 2 hours. Pt states she was at work when she saw it come on, denies new lotions/body washes/detergents. Pt reports itching, no pain/SOB.

## 2016-08-30 NOTE — ED Provider Notes (Signed)
[facility]-EMERGENCY DEPT Provider Note   CSN: [id number] Arrival date & time: 08/30/16  2003     History   Chief Complaint Chief Complaint  Patient presents with  . Urticaria    HPI Tanya Sanchez is a 26 y.o. female.  HPI Patient presents to emergency room with complaints of an itching rash that developed about 2 hours ago. The patient was at work when she suddenly noticed that she had a rash on her arms and torso and it was very itchy.  Patient denies any insect bites or stings. She denies any new lotions or detergents. No new medications. Patient does not recall having similar symptoms in the past. She denies any difficulty swallowing. No trouble with her breathing. Past Medical History:  Diagnosis Date  . ADHD (attention deficit hyperactivity disorder)   . Gallstones   . Panic attack 2016    Patient Active Problem List   Diagnosis Date Noted  . Lumbar herniated disc 08/28/2016  . Facet hypertrophy of lumbar region 08/28/2016  . Symptomatic cholelithiasis 10/14/2014  . Epigastric abdominal pain 10/11/2014  . Chronic tension headaches 10/11/2014  . Vitamin D deficiency 07/31/2014  . Panic attacks 07/30/2014  . Poor dentition 07/30/2014  . Allergic rhinitis 07/30/2014    Past Surgical History:  Procedure Laterality Date  . CHOLECYSTECTOMY N/A 10/15/2014   Procedure: LAPAROSCOPIC CHOLECYSTECTOMY WITH INTRAOPERATIVE CHOLANGIOGRAM;  Surgeon: Jimmye Norman, MD;  Location: [facility] OR;  Service: General;  Laterality: N/A;    OB History    No data available       Home Medications    Prior to Admission medications   Medication Sig Start Date End Date Taking? Authorizing Provider  acetaminophen-codeine (TYLENOL #3) 300-30 MG tablet Take 1 tablet by mouth every 8 (eight) hours as needed for severe pain. 07/31/16   Lizbeth Bark, FNP  amitriptyline (ELAVIL) 25 MG tablet Take 1 tablet (25 mg total) by mouth at bedtime. 06/05/16   Lizbeth Bark, FNP    aspirin-acetaminophen-caffeine (EXCEDRIN MIGRAINE) [phone] MG tablet Take 2 tablets by mouth every 6 (six) hours as needed for headache (pain).    [provider]  benzonatate (TESSALON) 100 MG capsule Take 1 capsule (100 mg total) by mouth every 8 (eight) hours. Patient not taking: Reported on 06/27/2016 02/05/16   Janne Napoleon, NP  cyclobenzaprine (FLEXERIL) 10 MG tablet Take 1 tablet (10 mg total) by mouth every 8 (eight) hours as needed for muscle spasms. 07/31/16   Lizbeth Bark, FNP  diphenhydrAMINE (BENADRYL) 25 MG tablet Take 1 tablet (25 mg total) by mouth every 6 (six) hours. 08/30/16   Linwood Dibbles, MD  famotidine (PEPCID) 20 MG tablet Take 1 tablet (20 mg total) by mouth 2 (two) times daily. 08/30/16   Linwood Dibbles, MD  HYDROcodone-acetaminophen (NORCO/VICODIN) 5-325 MG tablet Take 1-2 tablets by mouth every 4 (four) hours as needed for severe pain. 06/27/16   Shaune Pollack, MD  ibuprofen (ADVIL,MOTRIN) 800 MG tablet Take 1 tablet (800 mg total) by mouth every 8 (eight) hours as needed for headache or moderate pain (Take with food.). 06/05/16   Lizbeth Bark, FNP  meloxicam (MOBIC) 15 MG tablet Take 1 tablet (15 mg total) by mouth daily. TAKE WITH MEALS 08/07/16   Lizbeth Bark, FNP  methocarbamol (ROBAXIN) 500 MG tablet Take 1 tablet (500 mg total) by mouth every 8 (eight) hours as needed for muscle spasms. 08/07/16   Lizbeth Bark, FNP  naproxen (NAPROSYN) 500 MG tablet Take 1  tablet (500 mg total) by mouth 2 (two) times daily with a meal. 08/27/16   Geoffery Lyons, MD  naproxen sodium (ANAPROX DS) 550 MG tablet Take 1 tablet (550 mg total) by mouth 2 (two) times daily with a meal. Patient not taking: Reported on 06/27/2016 05/03/16   Janne Napoleon, NP  predniSONE (DELTASONE) 10 MG tablet Take 6 tablets by mouth day one, 5 tablets day two, 4 tablets day three, 3 tablets day four, 2 tablets day five, 1 tablet day six. 08/07/16   Lizbeth Bark, FNP   pseudoephedrine (SUDAFED) 30 MG tablet Take 1 tablet (30 mg total) by mouth every 6 (six) hours as needed for congestion. Patient not taking: Reported on 06/27/2016 02/05/16   Janne Napoleon, NP    Family History Family History  Problem Relation Age of Onset  . Heart disease Maternal Grandmother   . Hypertension Maternal Grandmother   . Depression Maternal Grandmother   . Arthritis Maternal Grandmother   . Depression Mother   . Cancer Mother   . Bladder Cancer Mother   . Depression Sister     Social History Social History  Substance Use Topics  . Smoking status: Never Smoker  . Smokeless tobacco: Never Used  . Alcohol use No     Allergies   Patient has no known allergies.   Review of Systems Review of Systems  All other systems reviewed and are negative.    Physical Exam Updated Vital Signs BP 115/64 (BP Location: Right Arm)   Pulse 84   Temp 98.3 F (36.8 C) (Oral)   Resp 18   Ht 1.575 m (5\' 2" )   Wt 71.2 kg (157 lb)   LMP 08/16/2016   SpO2 100%   BMI 28.72 kg/m   Physical Exam  Constitutional: She appears well-developed and well-nourished. No distress.  HENT:  Head: Normocephalic and atraumatic.  Right Ear: External ear normal.  Left Ear: External ear normal.  Mouth/Throat: Oropharynx is clear and moist. No uvula swelling. No oropharyngeal exudate.  Eyes: Conjunctivae are normal. Right eye exhibits no discharge. Left eye exhibits no discharge. No scleral icterus.  Neck: Neck supple. No tracheal deviation present.  Cardiovascular: Normal rate, regular rhythm and intact distal pulses.   Pulmonary/Chest: Effort normal and breath sounds normal. No stridor. No respiratory distress. She has no wheezes. She has no rales.  Abdominal: Soft. Bowel sounds are normal. She exhibits no distension. There is no tenderness. There is no rebound and no guarding.  Musculoskeletal: She exhibits no edema or tenderness.  Neurological: She is alert. She has normal strength. No  cranial nerve deficit (no facial droop, extraocular movements intact, no slurred speech) or sensory deficit. She exhibits normal muscle tone. She displays no seizure activity. Coordination normal.  Skin: Skin is warm and dry. No rash noted.  Psychiatric: She has a normal mood and affect.  Nursing note and vitals reviewed.    ED Treatments / Results    Radiology No results found.  Procedures Procedures (including critical care time)  Medications Ordered in ED Medications  diphenhydrAMINE (BENADRYL) injection 50 mg (not administered)  famotidine (PEPCID) tablet 20 mg (not administered)     Initial Impression / Assessment and Plan / ED Course  I have reviewed the triage vital signs and the nursing notes.  Pertinent labs & imaging results that were available during my care of the patient were reviewed by me and considered in my medical decision making (see chart for details).   patient  has urticaria without evidence of angioedema or systemic anaphylaxis. Patient was given oral Pepcid and Benadryl IM to help her with her itching. Plan on discharge home with antihistamines. Discussed outpatient follow-up. Return to the emergency room for difficulty swallowing breathing or other worsening concerns  Final Clinical Impressions(s) / ED Diagnoses   Final diagnoses:  Urticaria    New Prescriptions New Prescriptions   DIPHENHYDRAMINE (BENADRYL) 25 MG TABLET    Take 1 tablet (25 mg total) by mouth every 6 (six) hours.   FAMOTIDINE (PEPCID) 20 MG TABLET    Take 1 tablet (20 mg total) by mouth 2 (two) times daily.     Linwood DibblesKnapp, Cena Bruhn, MD 08/30/16 2043

## 2016-08-31 ENCOUNTER — Encounter: Payer: Self-pay | Admitting: Licensed Clinical Social Worker

## 2016-08-31 ENCOUNTER — Ambulatory Visit: Payer: BLUE CROSS/BLUE SHIELD | Attending: Family Medicine | Admitting: Family Medicine

## 2016-08-31 VITALS — BP 112/71 | HR 114 | Temp 98.1°F | Resp 18 | Ht 62.0 in | Wt 151.0 lb

## 2016-08-31 DIAGNOSIS — F410 Panic disorder [episodic paroxysmal anxiety] without agoraphobia: Secondary | ICD-10-CM

## 2016-08-31 DIAGNOSIS — Z7982 Long term (current) use of aspirin: Secondary | ICD-10-CM | POA: Insufficient documentation

## 2016-08-31 DIAGNOSIS — M545 Low back pain: Secondary | ICD-10-CM

## 2016-08-31 DIAGNOSIS — Z79899 Other long term (current) drug therapy: Secondary | ICD-10-CM | POA: Insufficient documentation

## 2016-08-31 DIAGNOSIS — G8929 Other chronic pain: Secondary | ICD-10-CM | POA: Insufficient documentation

## 2016-08-31 DIAGNOSIS — R200 Anesthesia of skin: Secondary | ICD-10-CM | POA: Insufficient documentation

## 2016-08-31 DIAGNOSIS — F418 Other specified anxiety disorders: Secondary | ICD-10-CM

## 2016-08-31 DIAGNOSIS — Z8659 Personal history of other mental and behavioral disorders: Secondary | ICD-10-CM | POA: Diagnosis not present

## 2016-08-31 DIAGNOSIS — Z09 Encounter for follow-up examination after completed treatment for conditions other than malignant neoplasm: Secondary | ICD-10-CM | POA: Diagnosis not present

## 2016-08-31 MED ORDER — FLUOXETINE HCL 10 MG PO TABS: 20.0000 mg | ORAL_TABLET | Freq: Every day | ORAL | 1 refills | Status: DC

## 2016-08-31 MED ORDER — ACETAMINOPHEN-CODEINE #3 300-30 MG PO TABS: 1.0000 | ORAL_TABLET | Freq: Three times a day (TID) | ORAL | 0 refills | Status: DC | PRN

## 2016-08-31 MED FILL — ?FLUOXETINE HCL 10 MG CAP: 30 days supply | Qty: 60 | Fill #0

## 2016-08-31 NOTE — Patient Instructions (Signed)
Living With Anxiety After being diagnosed with an anxiety disorder, you may be relieved to know why you have felt or behaved a certain way. It is natural to also feel overwhelmed about the treatment ahead and what it will mean for your life. With care and support, you can manage this condition and recover from it. How to cope with anxiety Dealing with stress Stress is your body's reaction to life changes and events, both good and bad. Stress can last just a few hours or it can be ongoing. Stress can play a major role in anxiety, so it is important to learn both how to cope with stress and how to think about it differently. Talk with your health care provider or a counselor to learn more about stress reduction. He or she may suggest some stress reduction techniques, such as:  Music therapy. This can include creating or listening to music that you enjoy and that inspires you.  Mindfulness-based meditation. This involves being aware of your normal breaths, rather than trying to control your breathing. It can be done while sitting or walking.  Centering prayer. This is a kind of meditation that involves focusing on a word, phrase, or sacred image that is meaningful to you and that brings you peace.  Deep breathing. To do this, expand your stomach and inhale slowly through your nose. Hold your breath for 3-5 seconds. Then exhale slowly, allowing your stomach muscles to relax.  Self-talk. This is a skill where you identify thought patterns that lead to anxiety reactions and correct those thoughts.  Muscle relaxation. This involves tensing muscles then relaxing them.  Choose a stress reduction technique that fits your lifestyle and personality. Stress reduction techniques take time and practice. Set aside 5-15 minutes a day to do them. Therapists can offer training in these techniques. The training may be covered by some insurance plans. Other things you can do to manage stress include:  Keeping a  stress diary. This can help you learn what triggers your stress and ways to control your response.  Thinking about how you respond to certain situations. You may not be able to control everything, but you can control your reaction.  Making time for activities that help you relax, and not feeling guilty about spending your time in this way.  Therapy combined with coping and stress-reduction skills provides the best chance for successful treatment. Medicines Medicines can help ease symptoms. Medicines for anxiety include:  Anti-anxiety drugs.  Antidepressants.  Beta-blockers.  Medicines may be used as the main treatment for anxiety disorder, along with therapy, or if other treatments are not working. Medicines should be prescribed by a health care provider. Relationships Relationships can play a big part in helping you recover. Try to spend more time connecting with trusted friends and family members. Consider going to couples counseling, taking family education classes, or going to family therapy. Therapy can help you and others better understand the condition. How to recognize changes in your condition Everyone has a different response to treatment for anxiety. Recovery from anxiety happens when symptoms decrease and stop interfering with your daily activities at home or work. This may mean that you will start to:  Have better concentration and focus.  Sleep better.  Be less irritable.  Have more energy.  Have improved memory.  It is important to recognize when your condition is getting worse. Contact your health care provider if your symptoms interfere with home or work and you do not feel like your condition  is improving. Where to find help and support: You can get help and support from these sources:  Self-help groups.  Online and [employer].  A trusted spiritual leader.  Couples counseling.  Family education classes.  Family therapy.  Follow these  instructions at home:  Eat a healthy diet that includes plenty of vegetables, fruits, whole grains, low-fat dairy products, and lean protein. Do not eat a lot of foods that are high in solid fats, added sugars, or salt.  Exercise. Most adults should do the following: ? Exercise for at least 150 minutes each week. The exercise should increase your heart rate and make you sweat (moderate-intensity exercise). ? Strengthening exercises at least twice a week.  Cut down on caffeine, tobacco, alcohol, and other potentially harmful substances.  Get the right amount and quality of sleep. Most adults need 7-9 hours of sleep each night.  Make choices that simplify your life.  Take over-the-counter and prescription medicines only as told by your health care provider.  Avoid caffeine, alcohol, and certain over-the-counter cold medicines. These may make you feel worse. Ask your pharmacist which medicines to avoid.  Keep all follow-up visits as told by your health care provider. This is important. Questions to ask your health care provider  Would I benefit from therapy?  How often should I follow up with a health care provider?  How long do I need to take medicine?  Are there any long-term side effects of my medicine?  Are there any alternatives to taking medicine? Contact a health care provider if:  You have a hard time staying focused or finishing daily tasks.  You spend many hours a day feeling worried about everyday life.  You become exhausted by worry.  You start to have headaches, feel tense, or have nausea.  You urinate more than normal.  You have diarrhea. Get help right away if:  You have a racing heart and shortness of breath.  You have thoughts of hurting yourself or others. If you ever feel like you may hurt yourself or others, or have thoughts about taking your own life, get help right away. You can go to your nearest emergency department or call:  Your local emergency  services (911 in the [place]  A suicide crisis helpline, such as the National Suicide Prevention Lifeline at (325)632-9092. This is open 24-hours a day.  Summary  Taking steps to deal with stress can help calm you.  Medicines cannot cure anxiety disorders, but they can help ease symptoms.  Family, friends, and partners can play a big part in helping you recover from an anxiety disorder. This information is not intended to replace advice given to you by your health care provider. Make sure you discuss any questions you have with your health care provider. Document Released: 03/03/2016 Document Revised: 03/03/2016 Document Reviewed: 03/03/2016 Elsevier Interactive Patient Education  2018 [employer].   Living With Depression Everyone experiences occasional disappointment, sadness, and loss in their lives. When you are feeling down, blue, or sad for at least 2 weeks in a row, it may mean that you have depression. Depression can affect your thoughts and feelings, relationships, daily activities, and physical health. It is caused by changes in the way your brain functions. If you receive a diagnosis of depression, your health care provider will tell you which type of depression you have and what treatment options are available to you. If you are living with depression, there are ways to help you recover from it and  also ways to prevent it from coming back. How to cope with lifestyle changes Coping with stress Stress is your body's reaction to life changes and events, both good and bad. Stressful situations may include:  Getting married.  The death of a spouse.  Losing a job.  Retiring.  Having a baby.  Stress can last just a few hours or it can be ongoing. Stress can play a major role in depression, so it is important to learn both how to cope with stress and how to think about it differently. Talk with your health care provider or a counselor if you would like to learn more about  stress reduction. He or she may suggest some stress reduction techniques, such as:  Music therapy. This can include creating music or listening to music. Choose music that you enjoy and that inspires you.  Mindfulness-based meditation. This kind of meditation can be done while sitting or walking. It involves being aware of your normal breaths, rather than trying to control your breathing.  Centering prayer. This is a kind of meditation that involves focusing on a spiritual word or phrase. Choose a word, phrase, or sacred image that is meaningful to you and that brings you peace.  Deep breathing. To do this, expand your stomach and inhale slowly through your nose. Hold your breath for 3-5 seconds, then exhale slowly, allowing your stomach muscles to relax.  Muscle relaxation. This involves intentionally tensing muscles then relaxing them.  Choose a stress reduction technique that fits your lifestyle and personality. Stress reduction techniques take time and practice to develop. Set aside 5-15 minutes a day to do them. Therapists can offer training in these techniques. The training may be covered by some insurance plans. Other things you can do to manage stress include:  Keeping a stress diary. This can help you learn what triggers your stress and ways to control your response.  Understanding what your limits are and saying no to requests or events that lead to a schedule that is too full.  Thinking about how you respond to certain situations. You may not be able to control everything, but you can control how you react.  Adding humor to your life by watching funny films or TV shows.  Making time for activities that help you relax and not feeling guilty about spending your time this way.  Medicines Your health care provider may suggest certain medicines if he or she feels that they will help improve your condition. Avoid using alcohol and other substances that may prevent your medicines from  working properly (may interact). It is also important to:  Talk with your pharmacist or health care provider about all the medicines that you take, their possible side effects, and what medicines are safe to take together.  Make it your goal to take part in all treatment decisions (shared decision-making). This includes giving input on the side effects of medicines. It is best if shared decision-making with your health care provider is part of your total treatment plan.  If your health care provider prescribes a medicine, you may not notice the full benefits of it for 4-8 weeks. Most people who are treated for depression need to be on medicine for at least 6-12 months after they feel better. If you are taking medicines as part of your treatment, do not stop taking medicines without first talking to your health care provider. You may need to have the medicine slowly decreased (tapered) over time to decrease the risk of  harmful side effects. Relationships Your health care provider may suggest family therapy along with individual therapy and drug therapy. While there may not be family problems that are causing you to feel depressed, it is still important to make sure your family learns as much as they can about your mental health. Having your family's support can help make your treatment successful. How to recognize changes in your condition Everyone has a different response to treatment for depression. Recovery from major depression happens when you have not had signs of major depression for two months. This may mean that you will start to:  Have more interest in doing activities.  Feel less hopeless than you did 2 months ago.  Have more energy.  Overeat less often, or have better or improving appetite.  Have better concentration.  Your health care provider will work with you to decide the next steps in your recovery. It is also important to recognize when your condition is getting worse. Watch  for these signs:  Having fatigue or low energy.  Eating too much or too little.  Sleeping too much or too little.  Feeling restless, agitated, or hopeless.  Having trouble concentrating or making decisions.  Having unexplained physical complaints.  Feeling irritable, angry, or aggressive.  Get help as soon as you or your family members notice these symptoms coming back. How to get support and help from others How to talk with friends and family members about your condition Talking to friends and family members about your condition can provide you with one way to get support and guidance. Reach out to trusted friends or family members, explain your symptoms to them, and let them know that you are working with a health care provider to treat your depression. Financial resources Not all insurance plans cover mental health care, so it is important to check with your insurance carrier. If paying for co-pays or counseling services is a problem, search for a local or county mental health care center. They may be able to offer public mental health care services at low or no cost when you are not able to see a private health care provider. If you are taking medicine for depression, you may be able to get the generic form, which may be less expensive. Some makers of prescription medicines also offer help to patients who cannot afford the medicines they need. Follow these instructions at home:  Get the right amount and quality of sleep.  Cut down on using caffeine, tobacco, alcohol, and other potentially harmful substances.  Try to exercise, such as walking or lifting small weights.  Take over-the-counter and prescription medicines only as told by your health care provider.  Eat a healthy diet that includes plenty of vegetables, fruits, whole grains, low-fat dairy products, and lean protein. Do not eat a lot of foods that are high in solid fats, added sugars, or salt.  Keep all follow-up  visits as told by your health care provider. This is important. Contact a health care provider if:  You stop taking your antidepressant medicines, and you have any of these symptoms: ? Nausea. ? Headache. ? Feeling lightheaded. ? Chills and body aches. ? Not being able to sleep (insomnia).  You or your friends and family think your depression is getting worse. Get help right away if:  You have thoughts of hurting yourself or others. If you ever feel like you may hurt yourself or others, or have thoughts about taking your own life, get help right away.  You can go to your nearest emergency department or call:  Your local emergency services (911 in the [place]  A suicide crisis helpline, such as the National Suicide Prevention Lifeline at 585-489-3899. This is open 24-hours a day.  Summary  If you are living with depression, there are ways to help you recover from it and also ways to prevent it from coming back.  Work with your health care team to create a management plan that includes counseling, stress management techniques, and healthy lifestyle habits. This information is not intended to replace advice given to you by your health care provider. Make sure you discuss any questions you have with your health care provider. Document Released: 02/10/2016 Document Revised: 02/10/2016 Document Reviewed: 02/10/2016 Elsevier Interactive Patient Education  2018 [employer].    Fluoxetine capsules or tablets (Depression/Mood Disorders) What is this medicine? FLUOXETINE (floo OX e teen) belongs to a class of drugs known as selective serotonin reuptake inhibitors (SSRIs). It helps to treat mood problems such as depression, obsessive compulsive disorder, and panic attacks. It can also treat certain eating disorders. This medicine may be used for other purposes; ask your health care provider or pharmacist if you have questions. COMMON BRAND NAME(S): Prozac What should I tell my health care  provider before I take this medicine? They need to know if you have any of these conditions: -bipolar disorder or a family history of bipolar disorder -bleeding disorders -glaucoma -heart disease -liver disease -low levels of sodium in the blood -seizures -suicidal thoughts, plans, or attempt; a previous suicide attempt by you or a family member -take MAOIs like Carbex, Eldepryl, Marplan, Nardil, and Parnate -take medicines that treat or prevent blood clots -thyroid disease -an unusual or allergic reaction to fluoxetine, other medicines, foods, dyes, or preservatives -pregnant or trying to get pregnant -breast-feeding How should I use this medicine? Take this medicine by mouth with a glass of water. Follow the directions on the prescription label. You can take this medicine with or without food. Take your medicine at regular intervals. Do not take it more often than directed. Do not stop taking this medicine suddenly except upon the advice of your doctor. Stopping this medicine too quickly may cause serious side effects or your condition may worsen. A special MedGuide will be given to you by the pharmacist with each prescription and refill. Be sure to read this information carefully each time. Talk to your pediatrician regarding the use of this medicine in children. While this drug may be prescribed for children as young as 7 years for selected conditions, precautions do apply. Overdosage: If you think you have taken too much of this medicine contact a poison control center or emergency room at once. NOTE: This medicine is only for you. Do not share this medicine with others. What if I miss a dose? If you miss a dose, skip the missed dose and go back to your regular dosing schedule. Do not take double or extra doses. What may interact with this medicine? Do not take this medicine with any of the following medications: -other medicines containing fluoxetine, like Sarafem or  Symbyax -cisapride -linezolid -MAOIs like Carbex, Eldepryl, Marplan, Nardil, and Parnate -methylene blue (injected into a vein) -pimozide -thioridazine This medicine may also interact with the following medications: -alcohol -amphetamines -aspirin and aspirin-like medicines -carbamazepine -certain medicines for depression, anxiety, or psychotic disturbances -certain medicines for migraine headaches like almotriptan, eletriptan, frovatriptan, naratriptan, rizatriptan, sumatriptan, zolmitriptan -digoxin -diuretics -fentanyl -flecainide -furazolidone -isoniazid -lithium -medicines for  sleep -medicines that treat or prevent blood clots like warfarin, enoxaparin, and dalteparin -NSAIDs, medicines for pain and inflammation, like ibuprofen or naproxen -phenytoin -procarbazine -propafenone -rasagiline -ritonavir -supplements like St. John's wort, kava kava, valerian -tramadol -tryptophan -vinblastine This list may not describe all possible interactions. Give your health care provider a list of all the medicines, herbs, non-prescription drugs, or dietary supplements you use. Also tell them if you smoke, drink alcohol, or use illegal drugs. Some items may interact with your medicine. What should I watch for while using this medicine? Tell your doctor if your symptoms do not get better or if they get worse. Visit your doctor or health care professional for regular checks on your progress. Because it may take several weeks to see the full effects of this medicine, it is important to continue your treatment as prescribed by your doctor. Patients and their families should watch out for new or worsening thoughts of suicide or depression. Also watch out for sudden changes in feelings such as feeling anxious, agitated, panicky, irritable, hostile, aggressive, impulsive, severely restless, overly excited and hyperactive, or not being able to sleep. If this happens, especially at the beginning of  treatment or after a change in dose, call your health care professional. Bonita Quin may get drowsy or dizzy. Do not drive, use machinery, or do anything that needs mental alertness until you know how this medicine affects you. Do not stand or sit up quickly, especially if you are an older patient. This reduces the risk of dizzy or fainting spells. Alcohol may interfere with the effect of this medicine. Avoid alcoholic drinks. Your mouth may get dry. Chewing sugarless gum or sucking hard candy, and drinking plenty of water may help. Contact your doctor if the problem does not go away or is severe. This medicine may affect blood sugar levels. If you have diabetes, check with your doctor or health care professional before you change your diet or the dose of your diabetic medicine. What side effects may I notice from receiving this medicine? Side effects that you should report to your doctor or health care professional as soon as possible: -allergic reactions like skin rash, itching or hives, swelling of the face, lips, or tongue -anxious -black, tarry stools -breathing problems -changes in vision -confusion -elevated mood, decreased need for sleep, racing thoughts, impulsive behavior -eye pain -fast, irregular heartbeat -feeling faint or lightheaded, falls -feeling agitated, angry, or irritable -hallucination, loss of contact with reality -loss of balance or coordination -loss of memory -painful or prolonged erections -restlessness, pacing, inability to keep still -seizures -stiff muscles -suicidal thoughts or other mood changes -trouble sleeping -unusual bleeding or bruising -unusually weak or tired -vomiting Side effects that usually do not require medical attention (report to your doctor or health care professional if they continue or are bothersome): -change in appetite or weight -change in sex drive or performance -diarrhea -dry mouth -headache -increased  sweating -nausea -tremors This list may not describe all possible side effects. Call your doctor for medical advice about side effects. You may report side effects to FDA at 1-800-FDA-1088. Where should I keep my medicine? Keep out of the reach of children. Store at room temperature between 15 and 30 degrees C (59 and 86 degrees F). Throw away any unused medicine after the expiration date. NOTE: This sheet is a summary. It may not cover all possible information. If you have questions about this medicine, talk to your doctor, pharmacist, or health care provider.  2018 Elsevier/[employer] (  2015-08-10 15:55:27)  

## 2016-08-31 NOTE — Progress Notes (Signed)
Subjective:  Patient ID: Tanya Sanchez, female    DOB: 1990-05-05  Age: 26 y.o. MRN: [id number]  CC: Follow-up   HPI Tanya Sanchez presents for follow up. History of back pain problems. Recent history of ED visit 08/26/2016. Works a Nature conservation officer at [employer]. Reports history of heavy lifting  Symptoms have been present for 2 years and include numbness in burning sensation that radiats to bilateral shoulder blades. Initial inciting event: none. Reports nausea with back pain symptoms this morning. Reports back pain 9/10 at worst. She denies any bowel or bladder incontinence. Alleviating factors identifiable by patient are recumbency. Exacerbating factors identifiable by patient are bending forwards, sitting and walking. Treatments so far initiated by patient: OTC ibuprofen and back brace. Previous lower back problems: yes. Previous workup: xray and MRI. Recent MRI showed normal thoracic spinal column L1-L3; Lumbar spine L4-L5 showed very minimal bulging of the disc and very minimal arthritis. Referral to orthopedics and PT were made. Previous treatments: prescription NSAID'S and opioid medications. Reports minimal relief with muscle relaxants and anti-inflammatories.  History of anxiety and depression.  She has the following symptoms: difficulty concentrating, feelings of losing control, racing thoughts. Onset of symptoms was approximately a few years ago, stable since that time. She denies current suicidal and homicidal ideation. Family history significant for anxiety, depression and substance abuse.Possible organic causes contributing are: none. Risk factors: History of panic attacks in the past, positive family history in mother, and negative life event: mother has cancer and car broke down 1 month ago. She also reports mother has had history of overdose twice in the past. Previous treatment includes none.  She reports coping mechanisms include listening to music, play games on the computer,  and eating ice cream. She is agreeable to counseling resources and medication at this time.   Outpatient Medications Prior to Visit  Medication Sig Dispense Refill  . aspirin-acetaminophen-caffeine (EXCEDRIN MIGRAINE) 250-250-65 MG tablet Take 2 tablets by mouth every 6 (six) hours as needed for headache (pain).    . benzonatate (TESSALON) 100 MG capsule Take 1 capsule (100 mg total) by mouth every 8 (eight) hours. 21 capsule 0  . famotidine (PEPCID) 20 MG tablet Take 1 tablet (20 mg total) by mouth 2 (two) times daily. 10 tablet 0  . methocarbamol (ROBAXIN) 500 MG tablet Take 1 tablet (500 mg total) by mouth every 8 (eight) hours as needed for muscle spasms. 20 tablet 0  . naproxen (NAPROSYN) 500 MG tablet Take 1 tablet (500 mg total) by mouth 2 (two) times daily with a meal. 15 tablet 0  . acetaminophen-codeine (TYLENOL #3) 300-30 MG tablet Take 1 tablet by mouth every 8 (eight) hours as needed for severe pain. 30 tablet 0  . amitriptyline (ELAVIL) 25 MG tablet Take 1 tablet (25 mg total) by mouth at bedtime. 30 tablet 1  . cyclobenzaprine (FLEXERIL) 10 MG tablet Take 1 tablet (10 mg total) by mouth every 8 (eight) hours as needed for muscle spasms. 20 tablet 0  . diphenhydrAMINE (BENADRYL) 25 MG tablet Take 1 tablet (25 mg total) by mouth every 6 (six) hours. 20 tablet 0  . HYDROcodone-acetaminophen (NORCO/VICODIN) 5-325 MG tablet Take 1-2 tablets by mouth every 4 (four) hours as needed for severe pain. 12 tablet 0  . ibuprofen (ADVIL,MOTRIN) 800 MG tablet Take 1 tablet (800 mg total) by mouth every 8 (eight) hours as needed for headache or moderate pain (Take with food.). 40 tablet 0  . meloxicam (MOBIC) 15 MG tablet  Take 1 tablet (15 mg total) by mouth daily. TAKE WITH MEALS 30 tablet 0  . naproxen sodium (ANAPROX DS) 550 MG tablet Take 1 tablet (550 mg total) by mouth 2 (two) times daily with a meal. (Patient not taking: Reported on 06/27/2016) 20 tablet 0  . predniSONE (DELTASONE) 10 MG tablet  Take 6 tablets by mouth day one, 5 tablets day two, 4 tablets day three, 3 tablets day four, 2 tablets day five, 1 tablet day six. 21 tablet 0  . pseudoephedrine (SUDAFED) 30 MG tablet Take 1 tablet (30 mg total) by mouth every 6 (six) hours as needed for congestion. (Patient not taking: Reported on 06/27/2016) 30 tablet 0   No facility-administered medications prior to visit.     ROS Review of Systems  Constitutional: Negative.   Respiratory: Negative.   Cardiovascular: Negative.   Gastrointestinal: Negative.   Musculoskeletal: Positive for back pain.  Neurological: Negative.   Psychiatric/Behavioral: Positive for dysphoric mood. The patient is nervous/anxious.     Objective:  BP 112/71 (BP Location: Left Arm, Patient Position: Sitting, Cuff Size: Normal)   Pulse (!) 114   Temp 98.1 F (36.7 C) (Oral)   Resp 18   Ht 5\' 2"  (1.575 m)   Wt 151 lb (68.5 kg)   LMP 08/16/2016   SpO2 99%   BMI 27.62 kg/m   BP/Weight 09/10/2016 08/31/2016 08/30/2016  Systolic BP 111 112 115  Diastolic BP 75 71 64  Wt. (Lbs) 150 151 157  BMI 27.44 27.62 28.72     Physical Exam  Constitutional: She is oriented to person, place, and time. She appears well-developed and well-nourished.  Cardiovascular: Normal rate, regular rhythm, normal heart sounds and intact distal pulses.   Pulmonary/Chest: Effort normal and breath sounds normal.  Abdominal: Soft. Bowel sounds are normal.  Musculoskeletal:       Lumbar back: She exhibits pain and spasm.  Neurological: She is alert and oriented to person, place, and time. She has normal reflexes.  Skin: Skin is warm and dry.  Psychiatric: Her affect is blunt. Her speech is delayed. She is slowed. She expresses no homicidal and no suicidal ideation. She expresses no suicidal plans and no homicidal plans.  Nursing note and vitals reviewed.   Dep[facility] 08/31/2016 08/07/2016 07/31/2016  Decreased Interest 2 1 1   Down, Depressed, Hopeless 0 0 1  PHQ -  2 Score 2 1 2   Altered sleeping 1 2 2   Tired, decreased energy 2 1 1   Change in appetite 3 3 1   Feeling bad or failure about yourself  0 0 1  Trouble concentrating 0 0 0  Moving slowly or fidgety/restless 0 0 0  Suicidal thoughts 0 - 0  PHQ-9 Score 8 7 7    GAD 7 : Generalized Anxiety Score 08/31/2016 07/31/2016 06/05/2016  Nervous, Anxious, on Edge 1 1 3   Control/stop worrying 1 1 1   Worry too much - different things 1 1 1   Trouble relaxing 1 1 2   Restless 0 0 1  Easily annoyed or irritable 2 1 2   Afraid - awful might happen 0 1 2  Total GAD 7 Score 6 6 12     Assessment & Plan:   Problem List Items Addressed This Visit    None    Visit Diagnoses    Follow up    -  Primary   Chronic bilateral low back pain without sciatica       Relevant Medications   acetaminophen-codeine (TYLENOL #3)  300-30 MG tablet   Anxiety with depression       Relevant Orders   Ambulatory referral to Psychiatry   History of panic attacks       Relevant Orders   Ambulatory referral to Psychiatry      Meds ordered this encounter  Medications  . acetaminophen-codeine (TYLENOL #3) 300-30 MG tablet    Sig: Take 1 tablet by mouth every 8 (eight) hours as needed for severe pain. Use sparingly.    Dispense:  30 tablet    Refill:  0    Must have office visit for refill.    Order Specific Question:   Supervising Provider    Answer:   Quentin Angst [id number]  . DISCONTD: FLUoxetine (PROZAC) 10 MG tablet    Sig: Take 2 tablets (20 mg total) by mouth daily.    Dispense:  60 tablet    Refill:  1    Order Specific Question:   Supervising Provider    Answer:   Quentin Angst [id number]    Follow-up: Return in about 8 weeks (around 10/26/2016) for Anxiety/ Depression .   Lizbeth Bark FNP

## 2016-08-31 NOTE — Progress Notes (Signed)
Patient is here for ED f/up  

## 2016-08-31 NOTE — BH Specialist Note (Signed)
Integrated Behavioral Health Follow Up Visit  MRN: [id number] Name: Tanya Sanchez   Session Start time: 4:45 PM Session End time: 5:00 PM Total time: 15 minutes Number of Integrated Behavioral Health Clinician visits: 2/10  Type of Service: Integrated Behavioral Health- Individual/Family Interpretor:No. Interpretor Name and Language: N/A   Warm Hand Off Completed.       SUBJECTIVE: Tanya Sanchez is a 26 y.o. female accompanied by patient. Patient was referred by FNP Hairston for depression and anxiety. Patient reports the following symptoms/concerns: feelings of worry, difficulty sleeping, racing thoughts, decreased appetite, and low energy Duration of problem: 1 year; Severity of problem: moderate  OBJECTIVE: Mood: Anxious and Affect: Appropriate Risk of harm to self or others: No plan to harm self or others   LIFE CONTEXT: Family and Social: Pt resides with her immediate family noting her grandmother resides around the corner School/Work: Pt is employed part-time in Restaurant manager, fast foodjanitorial services Self-Care: Pt enjoys listening to music and playing video games to cope with managing mental health and stress. No report of substance use Life Changes: Pt shared that mother was diagnosed with cancer approx 3 years ago and recently had to change treatment. Mother was recently discharged from hospital and the family vehicle has broken down.   GOALS ADDRESSED: Patient will reduce symptoms of: anxiety and depression and increase knowledge and/or ability of: coping skills and also: Increase adequate support systems for patient/family  INTERVENTIONS: Solution-Focused Strategies and Supportive Counseling Standardized Assessments completed: PHQ 2&9  ASSESSMENT: Patient currently experiencing depression and anxiety triggered by stress from caring for mother and financial strain. Patient shared that she is often worried about her mother's health and is having transportation barriers.  Patient reports feelings of worry, difficulty sleeping, racing thoughts, decreased appetite, and low energy. Patient may benefit from psychoeducation, psychotherapy, and medication management. Patient shared that she has been unable to attend support groups due to mother being sick and having to work. Patient's vehicle is not working and family is experiencing financial strain. [facility] provided pt with transportation assistance and encouraged pt to contact [facility] with any need for additional resources.   PLAN: 1. Follow up with behavioral health clinician on : Pt was encouraged tocontact [facility] if symptoms worsen or fail to improveto schedule [facility]. 2. Behavioral recommendations: [facility] recommends that pt apply healthy coping skills discussed, comply with medication management, strengthen support system by participating in support groups, and utilize resources provided. Pt is encouraged to schedule follow up appointment with [facility] 3. Referral(s): [facility]:  Transportation 4. "From scale of 1-10, how likely are you to follow plan?": 7/10  Bridgett LarssonJasmine D Lewis, LCSW 09/02/16 9:45 AM

## 2016-09-01 MED FILL — ACETAMINOPHEN/COD #3 TABLET: 300-30 | 10 days supply | Qty: 30 | Fill #0

## 2016-09-01 MED FILL — FAMOTIDINE 20 MG TABLET: 20 | 5 days supply | Qty: 10 | Fill #0

## 2016-09-01 NOTE — Telephone Encounter (Signed)
CMA call regarding lab results   Patient Verify DOB   Patient was aware and understood  

## 2016-09-01 NOTE — Telephone Encounter (Signed)
-----   Message from Lizbeth BarkMandesia R Hairston, FNP sent at 08/28/2016  5:38 PM EDT ----- Normal thoracic spinal column L1-L3; Lumbar spine L4-L5 showed very minimal bulging of the disc and very minimal arthritis.  You will be referred to orthopedics and physical therapy for further follow up.

## 2016-09-10 ENCOUNTER — Ambulatory Visit (INDEPENDENT_AMBULATORY_CARE_PROVIDER_SITE_OTHER): Payer: BLUE CROSS/BLUE SHIELD | Admitting: Specialist

## 2016-09-10 ENCOUNTER — Ambulatory Visit (INDEPENDENT_AMBULATORY_CARE_PROVIDER_SITE_OTHER): Payer: Self-pay | Admitting: Orthopaedic Surgery

## 2016-09-10 ENCOUNTER — Ambulatory Visit (INDEPENDENT_AMBULATORY_CARE_PROVIDER_SITE_OTHER): Payer: BLUE CROSS/BLUE SHIELD

## 2016-09-10 ENCOUNTER — Encounter (INDEPENDENT_AMBULATORY_CARE_PROVIDER_SITE_OTHER): Payer: Self-pay | Admitting: Specialist

## 2016-09-10 VITALS — BP 111/75 | HR 67 | Ht 62.0 in | Wt 150.0 lb

## 2016-09-10 DIAGNOSIS — M5440 Lumbago with sciatica, unspecified side: Secondary | ICD-10-CM | POA: Diagnosis not present

## 2016-09-10 DIAGNOSIS — R292 Abnormal reflex: Secondary | ICD-10-CM

## 2016-09-10 DIAGNOSIS — G8929 Other chronic pain: Secondary | ICD-10-CM | POA: Diagnosis not present

## 2016-09-10 DIAGNOSIS — M5442 Lumbago with sciatica, left side: Secondary | ICD-10-CM | POA: Diagnosis not present

## 2016-09-10 DIAGNOSIS — M542 Cervicalgia: Secondary | ICD-10-CM | POA: Diagnosis not present

## 2016-09-10 DIAGNOSIS — M25511 Pain in right shoulder: Secondary | ICD-10-CM

## 2016-09-10 LAB — TSH: TSH: 3.34 mIU/L

## 2016-09-10 MED ORDER — MELOXICAM 15 MG PO TABS: 15.0000 mg | ORAL_TABLET | Freq: Every day | ORAL | 3 refills | Status: DC

## 2016-09-10 MED ORDER — CYCLOBENZAPRINE HCL 5 MG PO TABS: 10.0000 mg | ORAL_TABLET | Freq: Three times a day (TID) | ORAL | 2 refills | Status: DC | PRN

## 2016-09-10 MED FILL — ?MELOXICAM 15 MG TABLET: 15 MG | 30 days supply | Qty: 30 | Fill #0

## 2016-09-10 NOTE — Progress Notes (Addendum)
Office Visit Note   Patient: Tanya Sanchez           Date of Birth: 1990/10/10           MRN: [id number] Visit Date: 09/10/2016              Requested by: Alfonse Spruce, [address], [place] [id number] PCP: Alfonse Spruce, FNP   Assessment & Plan: Visit Diagnoses:  1. Cervicalgia   2. Right shoulder pain, unspecified chronicity   3. Acute left-sided low back pain with sciatica, sciatica laterality unspecified   4. Acute left-sided low back pain with left-sided sciatica   5. Reflex abnormality   6. Chronic bilateral low back pain with left-sided sciatica   26 year old female with low back, interscapular and cervical pain. She has hyperreflexia in both arms and legs. She has dsyphoria That appears to be situational depression, mother is possibly terminally ill with bladder ca., no suicidal or homicidal ideation. She also demonstrates Hoffman's sign both hands and A few beats of clonus both legs. No history of cervical or head injury. Does describe low back pain history she relates to an increase In her job weight lifting requirements. Pain may be myofascial but increased reflexes with positive Hoffman's not explained by a Lumbar cause, cervical radiographs and exam otherwise not very remarkabe. A TSH is done today, will request a neurology assessment and consider for PT to try and improve her endurance and core motor.   Plan: Avoid frequent bending and stooping  No lifting greater than 10 lbs. May use ice or moist heat for pain. Weight loss is of benefit. Avoid overhead lifting and overhead use of the arms  Follow-Up Instructions: Return in about 6 weeks (around 10/22/2016).   Orders:  Orders Placed This Encounter  Procedures  . XR Cervical Spine 2 or 3 views  . TSH  . Ambulatory referral to Neurology  . Ambulatory referral to Physical Therapy   Meds ordered this encounter  Medications  . meloxicam (MOBIC) 15 MG tablet    Sig: Take 1 tablet  (15 mg total) by mouth daily. TAKE WITH MEALS    Dispense:  30 tablet    Refill:  3  . cyclobenzaprine (FLEXERIL) 5 MG tablet    Sig: Take 2 tablets (10 mg total) by mouth 3 (three) times daily as needed for muscle spasms.    Dispense:  60 tablet    Refill:  2      Procedures: No procedures performed   Clinical Data: No additional findings.   Subjective: Chief Complaint  Patient presents with  . 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 Pain is left mid lumbar, Worse sometimes more than others. Pain, sharp pain with burning sensation along the left para lumbar area. A couple of times with leg pain right one time all over the front of the right leg. No numbness or tingling. No bowel or bladder difficulty. Sometimes depending on how bad it hurts there is pain with coughing or sneezing. Pain with transitioning sitting to standing and lying down to get up. Nauseating at times. Right now the pain is a "6-7" sitting here this AM.  At times with constant moving she has pain. It wants to knot up real bad. When my back hurts real bad hard to use her arms, one night with pain in her shoulder blades and into her back and the right arm went numbness. Right arm has numbness, it was a while ago. Walking sometimes with back pain it makes it hard to catch her back. Sometimes later, sometimes at the time of lifting. Shoot pain into the right arm and she has to use the left arm. The arms started bothering her about 3-4 months ago. The right arm pain is worse than the back pain today.  Was taken off heavy lifting  Has been seen at [facility] twice and one time at [facility] in past 6 months.     Review of Systems   Objective: Vital Signs: BP 111/75   Pulse 67   Ht 5' 2"   (1.575 m)   Wt 150 lb (68 kg)   LMP 08/16/2016   BMI 27.44 kg/m   Physical Exam  Back Exam   Tenderness  The patient is experiencing tenderness in the lumbar, thoracic and cervical.  Range of Motion  Extension: normal  Flexion:  70 abnormal  Lateral Bend Right: normal  Lateral Bend Left: normal  Rotation Right: normal  Rotation Left: normal   Muscle Strength  Right Quadriceps:  5/5  Left Quadriceps:  5/5  Right Hamstrings:  5/5  Left Hamstrings:  5/5   Tests  Straight leg raise right: negative Straight leg raise left: negative  Reflexes  Patellar: Hyperreflexic Achilles: Hyperreflexic Biceps: Hyperreflexic Babinski's sign: normal   Other  Toe Walk: normal Heel Walk: normal Sensation: normal Gait: normal  Erythema: no back redness Scars: absent  Comments:  2-3 beats clonus, mild limitation of ROM of cervical spine by 10 percent to each side.  Hoffman sign is postive in both hands. Reflexes are symmetrically elevated by arms and legs.      Specialty Comments:  No specialty comments available.  Imaging: Xr Cervical Spine 2 Or 3 Views  Result Date: 09/10/2016 AP and lateral flexion and extension radiographs of the cervical spine shows disc height well maintained, No fracture, dislocation or subluxation, minimal spur at the anterior C6-7, no C1-2 abnormality. Soft tissue lines anterior to the cervical spine are normal.    PMFS History: Patient Active Problem List   Diagnosis Date Noted  . Lumbar herniated disc 08/28/2016  . Facet hypertrophy of lumbar region 08/28/2016  . Symptomatic cholelithiasis 10/14/2014  . Epigastric abdominal pain 10/11/2014  . Chronic tension headaches 10/11/2014  . Vitamin D deficiency 07/31/2014  . Panic attacks 07/30/2014  . Poor dentition 07/30/2014  . Allergic rhinitis 07/30/2014   Past Medical History:  Diagnosis Date  . ADHD (attention deficit hyperactivity disorder)   . Gallstones   . Panic attack 2016      Family History  Problem Relation Age of Onset  . Heart disease Maternal Grandmother   . Hypertension Maternal Grandmother   . Depression Maternal Grandmother   . Arthritis Maternal Grandmother   . Depression Mother   . Cancer Mother   . Bladder Cancer Mother   . Depression Sister     Past Surgical History:  Procedure Laterality Date  . CHOLECYSTECTOMY N/A 10/15/2014   Procedure: LAPAROSCOPIC CHOLECYSTECTOMY WITH INTRAOPERATIVE CHOLANGIOGRAM;  Surgeon: Judeth Horn, MD;  Location: [facility];  Service: General;  Laterality: N/A;   Social History   Occupational History  . [employer]  Social History Main Topics  . Smoking status: Never Smoker  . Smokeless tobacco: Never Used  . Alcohol use No  . Drug use: No  . Sexual activity: No

## 2016-09-10 NOTE — Patient Instructions (Signed)
Avoid frequent bending and stooping  No lifting greater than 10 lbs. May use ice or moist heat for pain. Weight loss is of benefit. Avoid overhead lifting and overhead use of the arms.

## 2016-09-23 ENCOUNTER — Emergency Department (HOSPITAL_COMMUNITY)
Admission: EM | Admit: 2016-09-23 | Discharge: 2016-09-23 | Disposition: A | Discharge status: Home / Self Care | Payer: BLUE CROSS/BLUE SHIELD | Attending: Emergency Medicine | Admitting: Emergency Medicine

## 2016-09-23 ENCOUNTER — Encounter (HOSPITAL_COMMUNITY): Payer: Self-pay

## 2016-09-23 DIAGNOSIS — E876 Hypokalemia: Secondary | ICD-10-CM

## 2016-09-23 DIAGNOSIS — R197 Diarrhea, unspecified: Secondary | ICD-10-CM | POA: Insufficient documentation

## 2016-09-23 DIAGNOSIS — M549 Dorsalgia, unspecified: Secondary | ICD-10-CM | POA: Diagnosis not present

## 2016-09-23 DIAGNOSIS — G8929 Other chronic pain: Secondary | ICD-10-CM | POA: Diagnosis not present

## 2016-09-23 DIAGNOSIS — Z79899 Other long term (current) drug therapy: Secondary | ICD-10-CM | POA: Diagnosis not present

## 2016-09-23 DIAGNOSIS — F909 Attention-deficit hyperactivity disorder, unspecified type: Secondary | ICD-10-CM | POA: Diagnosis not present

## 2016-09-23 DIAGNOSIS — R112 Nausea with vomiting, unspecified: Secondary | ICD-10-CM

## 2016-09-23 LAB — CBC WITH DIFFERENTIAL/PLATELET
BASOS PCT: 0 %
Basophils Absolute: 0 10*3/uL (ref 0.0–0.1)
EOS ABS: 0.1 10*3/uL (ref 0.0–0.7)
EOS PCT: 1 %
HCT: 40.9 % (ref 36.0–46.0)
Hemoglobin: 14.6 g/dL (ref 12.0–15.0)
LYMPHS ABS: 1.3 10*3/uL (ref 0.7–4.0)
Lymphocytes Relative: 15 %
MCH: 29.9 pg (ref 26.0–34.0)
MCHC: 35.7 g/dL (ref 30.0–36.0)
MCV: 83.6 fL (ref 78.0–100.0)
MONOS PCT: 4 %
Monocytes Absolute: 0.4 10*3/uL (ref 0.1–1.0)
Neutro Abs: 6.7 10*3/uL (ref 1.7–7.7)
Neutrophils Relative %: 80 %
PLATELETS: 243 10*3/uL (ref 150–400)
RBC: 4.89 MIL/uL (ref 3.87–5.11)
RDW: 12.1 % (ref 11.5–15.5)
WBC: 8.4 10*3/uL (ref 4.0–10.5)

## 2016-09-23 LAB — LIPASE, BLOOD: Lipase: 42 U/L (ref 11–51)

## 2016-09-23 LAB — COMPREHENSIVE METABOLIC PANEL
ALT: 13 U/L — ABNORMAL LOW (ref 14–54)
ANION GAP: 12 (ref 5–15)
AST: 16 U/L (ref 15–41)
Albumin: 4.6 g/dL (ref 3.5–5.0)
Alkaline Phosphatase: 68 U/L (ref 38–126)
BUN: 5 mg/dL — ABNORMAL LOW (ref 6–20)
CHLORIDE: 105 mmol/L (ref 101–111)
CO2: 21 mmol/L — AB (ref 22–32)
Calcium: 9.8 mg/dL (ref 8.9–10.3)
Creatinine, Ser: 0.71 mg/dL (ref 0.44–1.00)
GFR calc non Af Amer: >60 mL/min (ref >60)
Glucose, Bld: 81 mg/dL (ref 65–99)
Potassium: 3.1 mmol/L — ABNORMAL LOW (ref 3.5–5.1)
SODIUM: 138 mmol/L (ref 135–145)
Total Bilirubin: 0.8 mg/dL (ref 0.3–1.2)
Total Protein: 7.7 g/dL (ref 6.5–8.1)

## 2016-09-23 LAB — MAGNESIUM: MAGNESIUM: 1.9 mg/dL (ref 1.7–2.4)

## 2016-09-23 LAB — I-STAT BETA HCG BLOOD, ED (MC, WL, AP ONLY)

## 2016-09-23 MED ORDER — SODIUM CHLORIDE 0.9 % IV BOLUS (SEPSIS)
1000.0000 mL | Freq: Once | INTRAVENOUS | Status: AC
  Administered 2016-09-23: 1000 mL via INTRAVENOUS

## 2016-09-23 MED ORDER — METHOCARBAMOL 500 MG PO TABS: 500.0000 mg | ORAL_TABLET | Freq: Two times a day (BID) | ORAL | 0 refills | Status: DC

## 2016-09-23 MED ORDER — POTASSIUM CHLORIDE CRYS ER 20 MEQ PO TBCR
40.0000 meq | EXTENDED_RELEASE_TABLET | Freq: Once | ORAL | Status: AC
  Administered 2016-09-23: 40 meq via ORAL
  Filled 2016-09-23: qty 2

## 2016-09-23 MED ORDER — KETOROLAC TROMETHAMINE 30 MG/ML IJ SOLN
30.0000 mg | Freq: Once | INTRAMUSCULAR | Status: AC
  Administered 2016-09-23: 30 mg via INTRAVENOUS
  Filled 2016-09-23: qty 1

## 2016-09-23 MED ORDER — NAPROXEN 500 MG PO TABS: 500.0000 mg | ORAL_TABLET | Freq: Two times a day (BID) | ORAL | 0 refills | Status: DC

## 2016-09-23 MED ORDER — ONDANSETRON HCL 4 MG/2ML IJ SOLN
4.0000 mg | Freq: Once | INTRAMUSCULAR | Status: AC
  Administered 2016-09-23: 4 mg via INTRAVENOUS
  Filled 2016-09-23: qty 2

## 2016-09-23 NOTE — Discharge Instructions (Signed)
Take the prescribed medication as directed. Use heat or ice on your back.  Make sure to drink fluids to stay hydrated. Follow-up with your primary care doctor. Also recommend you follow-up with Dr. Otelia SergeantNitka, about your back. Return to the ED for new or worsening symptoms.

## 2016-09-23 NOTE — ED Triage Notes (Signed)
Pt comes in complaint of lower back pain X2 days. Pt reports she has also had nausea and been unable to eat. She also reports intermittent numbness and tingling in her arms and legs.

## 2016-09-23 NOTE — ED Provider Notes (Signed)
[facility] Provider Note   CSN: [id number] Arrival date & time: 09/23/16  1738     History   Chief Complaint Chief Complaint  Patient presents with  . Back Pain    HPI Tanya Sanchez is a 26 y.o. female.  The history is provided by the patient and medical records.  Back Pain     26 year old female female with history of ADHD, chronic back pain for the past 2+ years, anxiety with panic attacks, presenting to the ED with multiple complaints.  1.  Back pain.  Reports has been ongoing issue but has been worse over the past 2 days. She denies any injury, trauma, fall, or heavy lifting. She had MRI done one month ago with findings of minimal disc bulging at L4-L5, no other acute findings.  She has been seen by orthopedics, Dr. Otelia SergeantNitka, who is in the process of referring her to neurology and possible PT. She's not had any bowel or bladder incontinence.  States she tried taking some Tylenol today but threw it up.  2.  N/V/D-- started today. No sick contacts. No recent travel or abnormal food intake. States she has some abdominal cramping, but mostly her stomach is upset. Has tried drinking water and eating some crackers and pretzels today but continues vomiting. Denies any urinary symptoms or vaginal complaints. No pelvic pain. Prior abdominal surgeries include cholecystectomy.  3.  Paresthesias-- reports this has been intermittent throughout the day today in both hands and feet. States it occurs randomly and is not necessarily associated with exertion or certain movements. Denies any true numbness or weakness of her arms or legs. She's not had any difficulty walking. She's not had any falls or syncopal events.  States she has had this happen before and it just went away on its own eventually.  Past Medical History:  Diagnosis Date  . ADHD (attention deficit hyperactivity disorder)   . Gallstones   . Panic attack 2016    Patient Active Problem List   Diagnosis Date Noted  .  Lumbar herniated disc 08/28/2016  . Facet hypertrophy of lumbar region 08/28/2016  . Symptomatic cholelithiasis 10/14/2014  . Epigastric abdominal pain 10/11/2014  . Chronic tension headaches 10/11/2014  . Vitamin D deficiency 07/31/2014  . Panic attacks 07/30/2014  . Poor dentition 07/30/2014  . Allergic rhinitis 07/30/2014    Past Surgical History:  Procedure Laterality Date  . CHOLECYSTECTOMY N/A 10/15/2014   Procedure: LAPAROSCOPIC CHOLECYSTECTOMY WITH INTRAOPERATIVE CHOLANGIOGRAM;  Surgeon: Jimmye NormanJames Wyatt, MD;  Location: [facility] OR;  Service: General;  Laterality: N/A;    OB History    No data available       Home Medications    Prior to Admission medications   Medication Sig Start Date End Date Taking? Authorizing Provider  acetaminophen-codeine (TYLENOL #3) 300-30 MG tablet Take 1 tablet by mouth every 8 (eight) hours as needed for severe pain. Use sparingly. 08/31/16   Lizbeth BarkHairston, Mandesia R, FNP  amitriptyline (ELAVIL) 25 MG tablet  06/05/16   [provider]  aspirin-acetaminophen-caffeine (EXCEDRIN MIGRAINE) [phone] MG tablet Take 2 tablets by mouth every 6 (six) hours as needed for headache (pain).    [provider]  benzonatate (TESSALON) 100 MG capsule Take 1 capsule (100 mg total) by mouth every 8 (eight) hours. 02/05/16   Janne NapoleonNeese, Hope M, NP  cyclobenzaprine (FLEXERIL) 5 MG tablet Take 2 tablets (10 mg total) by mouth 3 (three) times daily as needed for muscle spasms. 09/10/16   Kerrin ChampagneNitka, James E, MD  famotidine (PEPCID) 20 MG tablet Take 1 tablet (20 mg total) by mouth 2 (two) times daily. 08/30/16   Linwood Dibbles, MD  meloxicam (MOBIC) 15 MG tablet Take 1 tablet (15 mg total) by mouth daily. TAKE WITH MEALS 09/10/16   Kerrin Champagne, MD  methocarbamol (ROBAXIN) 500 MG tablet Take 1 tablet (500 mg total) by mouth every 8 (eight) hours as needed for muscle spasms. 08/07/16   Lizbeth Bark, FNP  naproxen (NAPROSYN) 500 MG tablet Take 1 tablet (500 mg total) by  mouth 2 (two) times daily with a meal. 08/27/16   Geoffery Lyons, MD    Family History Family History  Problem Relation Age of Onset  . Heart disease Maternal Grandmother   . Hypertension Maternal Grandmother   . Depression Maternal Grandmother   . Arthritis Maternal Grandmother   . Depression Mother   . Cancer Mother   . Bladder Cancer Mother   . Depression Sister     Social History Social History  Substance Use Topics  . Smoking status: Never Smoker  . Smokeless tobacco: Never Used  . Alcohol use No     Allergies   Patient has no known allergies.   Review of Systems Review of Systems  Musculoskeletal: Positive for back pain.  All other systems reviewed and are negative.    Physical Exam Updated Vital Signs BP (!) 128/96 (BP Location: Right Arm)   Pulse 90   Temp 98 F (36.7 C) (Oral)   Resp 18   LMP 09/21/2016 (Within Days)   SpO2 100%   Physical Exam  Constitutional: She is oriented to person, place, and time. She appears well-developed and well-nourished.  HENT:  Head: Normocephalic and atraumatic.  Mouth/Throat: Oropharynx is clear and moist.  Eyes: Conjunctivae and EOM are normal. Pupils are equal, round, and reactive to light.  Neck: Normal range of motion.  Cardiovascular: Normal rate, regular rhythm and normal heart sounds.   Pulmonary/Chest: Effort normal and breath sounds normal. No respiratory distress. She has no wheezes.  Abdominal: Soft. Bowel sounds are normal. There is no tenderness. There is no rigidity and no guarding.  Musculoskeletal: Normal range of motion.  Mild tenderness along the midline lumbar spine, there is no acute deformity or step-off, full flexion and extension maintained, normal strength and sensation of both legs, normal gait  Neurological: She is alert and oriented to person, place, and time.  AAOx3, answering questions and following commands appropriately; equal strength UE and LE bilaterally; CN grossly intact; moves all  extremities appropriately without ataxia; no focal neuro deficits or facial asymmetry appreciated; no tremors, no seizure activity  Skin: Skin is warm and dry.  Psychiatric: She has a normal mood and affect.  Nursing note and vitals reviewed.    ED Treatments / Results  Labs (all labs ordered are listed, but only abnormal results are displayed) Labs Reviewed  COMPREHENSIVE METABOLIC PANEL - Abnormal; Notable for the following:       Result Value   Potassium 3.1 (*)    CO2 21 (*)    BUN 5 (*)    ALT 13 (*)    All other components within normal limits  CBC WITH DIFFERENTIAL/PLATELET  LIPASE, BLOOD  MAGNESIUM  URINALYSIS, ROUTINE W REFLEX MICROSCOPIC  I-STAT BETA HCG BLOOD, ED ([facility], [facility], [facility] ONLY)    EKG  EKG Interpretation None       Radiology No results found.  Procedures Procedures (including critical care time)  Medications Ordered in ED Medications -  No data to display   Initial Impression / Assessment and Plan / ED Course  I have reviewed the triage vital signs and the nursing notes.  Pertinent labs & imaging results that were available during my care of the patient were reviewed by me and considered in my medical decision making (see chart for details).  26 year old female here with multiple complaints.  1.  Back pain. This is a chronic issue for her. Had recent MRI lumbar spine with minimal bulging discs. Is followed by orthopedics. No focal neurologic deficits here concerning for cauda equina. No fever or other red flag symptoms concerning for spinal abscess. Will plan for conservative management.  Close FU with ortho.  Has been referred to neuro as well, possible PT in the future.  Rx robaxin and naprosyn.  2.  N/V/D.  started today. She is afebrile and nontoxic. Abdomen soft and benign. Normal bowel sounds. Screening labs overall reassuring.  Mild hypokalemia which is likely secondary to N/V/D.  Repleted here.  Tolerating PO.  May be viral in nature.  Given  rather benign exam, imaging deferred.  Continue supportive care at home.  3.  Paresthesias of bilateral hands and feet. Again, no focal deficits. Feel unlikely to be central cause as this is occurring bilaterally and is intermittent. No known provoking factors.  Electrolytes aside from mild hypokalemia all normal.  Will have her follow-up with neuro since she has already been referred there.  Can follow-up with her specialists and PCP as outlined above.  Discussed plan with patient, she acknowledged understanding and agreed with plan of care.  Return precautions given for new or worsening symptoms.  Final Clinical Impressions(s) / ED Diagnoses   Final diagnoses:  Chronic back pain, unspecified back location, unspecified back pain laterality  Nausea vomiting and diarrhea  Hypokalemia    New Prescriptions Discharge Medication List as of 09/23/2016 10:19 PM       Garlon Hatchet, PA-C 09/23/16 2250    Shaune Pollack, MD 09/25/16 [phone]

## 2016-09-24 MED FILL — NAPROXEN 500 MG TABLET: 500 | 15 days supply | Qty: 30 | Fill #0

## 2016-09-24 MED FILL — METHOCARBAMOL 500 MG TABLET: 500 | 10 days supply | Qty: 20 | Fill #0

## 2016-09-25 ENCOUNTER — Telehealth (INDEPENDENT_AMBULATORY_CARE_PROVIDER_SITE_OTHER): Payer: Self-pay | Admitting: *Deleted

## 2016-09-25 NOTE — Telephone Encounter (Signed)
ERROR

## 2016-09-29 ENCOUNTER — Ambulatory Visit (INDEPENDENT_AMBULATORY_CARE_PROVIDER_SITE_OTHER): Payer: BLUE CROSS/BLUE SHIELD | Admitting: Surgery

## 2016-09-29 ENCOUNTER — Encounter (INDEPENDENT_AMBULATORY_CARE_PROVIDER_SITE_OTHER): Payer: Self-pay | Admitting: Surgery

## 2016-09-29 VITALS — BP 106/68 | HR 79 | Ht 62.0 in | Wt 150.0 lb

## 2016-09-29 DIAGNOSIS — M542 Cervicalgia: Secondary | ICD-10-CM

## 2016-09-29 DIAGNOSIS — M2550 Pain in unspecified joint: Secondary | ICD-10-CM

## 2016-09-29 DIAGNOSIS — M25511 Pain in right shoulder: Secondary | ICD-10-CM

## 2016-09-29 DIAGNOSIS — G8929 Other chronic pain: Secondary | ICD-10-CM

## 2016-09-30 LAB — URIC ACID: Uric Acid, Serum: 2.3 mg/dL — ABNORMAL LOW (ref 2.5–7.0)

## 2016-09-30 LAB — SEDIMENTATION RATE: SED RATE: 1 mm/h (ref 0–20)

## 2016-09-30 LAB — ANTI-NUCLEAR AB-TITER (ANA TITER)

## 2016-09-30 LAB — ANA: Anti Nuclear Antibody(ANA): POSITIVE — AB

## 2016-09-30 LAB — RHEUMATOID FACTOR: Rhuematoid fact SerPl-aCnc: <14 IU/mL (ref <14)

## 2016-09-30 LAB — HIGH SENSITIVITY CRP: CRP HIGH SENSITIVITY: 0.2 mg/L

## 2016-10-08 NOTE — Progress Notes (Signed)
Office Visit Note   Patient: Tanya Sanchez           Date of Birth: October 26, 1990           MRN: [id number] Visit Date: 09/29/2016              Requested by: Alfonse Spruce, [address], [place] [address] PCP: Alfonse Spruce, FNP   Assessment & Plan: Visit Diagnoses:  1. Polyarthralgia   2. Chronic right shoulder pain   3. Neck pain     Plan: The patient's multiple complaints without sniffing findings on lumbar spine MRI recommend getting blood work to check arthritis panel. Patient will follow with Dr. Louanne Skye in a few weeks to discuss results. All questions answered.  Follow-Up Instructions: Return in about 3 weeks (around 10/20/2016).   Orders:  Orders Placed This Encounter  Procedures  . Uric acid  . Antinuclear Antib (ANA)  . Rheumatoid Factor  . Sed Rate (ESR)  . CRP High sensitivity  . Anti-nuclear ab-titer (ANA titer)   No orders of the defined types were placed in this encounter.     Procedures: No procedures performed   Clinical Data: No additional findings.   Subjective: Chief Complaint  Patient presents with  . Lower Back - Pain    HPI Patient returns for recheck of neck pain, right shoulder pain and back pain. States that she continues to have discomfort in all areas. She also describes some discomfort in other joints. She is not sure if there is a family history of inflammatory arthropathy. No complaints of fever chills. Patient recent lumbar MRI performed 08/24/2016 and report read Nearly normal examination. Very minimal desiccation and bulging of the disc at L4-5. Minimal facet hypertrophy at this level. These findings, being some minimal, are of debatable significance.      Review of Systems No current cardiac pulmonary GI GU issues.  Objective: Vital Signs: BP 106/68 (BP Location: Left Arm, Patient Position: Sitting)   Pulse 79   Ht 5' 2"  (1.575 m)   Wt 150 lb (68 kg)   LMP 09/21/2016 (Within Days)   BMI  27.44 kg/m   Physical Exam  Constitutional: She is oriented to person, place, and time. No distress.  HENT:  Head: Normocephalic and atraumatic.  Eyes: Pupils are equal, round, and reactive to light. EOM are normal.  Neck: Normal range of motion.  Pulmonary/Chest: No respiratory distress.  Musculoskeletal:  Gait is normal. Right shoulder she does have discomfort with impingement testing. Negative drop arm. Bilateral knees are somewhat tender palpation. No palpable effusion.  Neurological: She is alert and oriented to person, place, and time.  Skin: Skin is warm and dry.    Ortho Exam  Specialty Comments:  No specialty comments available.  Imaging: No results found.   PMFS History: Patient Active Problem List   Diagnosis Date Noted  . Lumbar herniated disc 08/28/2016  . Facet hypertrophy of lumbar region 08/28/2016  . Symptomatic cholelithiasis 10/14/2014  . Epigastric abdominal pain 10/11/2014  . Chronic tension headaches 10/11/2014  . Vitamin D deficiency 07/31/2014  . Panic attacks 07/30/2014  . Poor dentition 07/30/2014  . Allergic rhinitis 07/30/2014   Past Medical History:  Diagnosis Date  . ADHD (attention deficit hyperactivity disorder)   . Gallstones   . Panic attack 2016    Family History  Problem Relation Age of Onset  . Heart disease Maternal Grandmother   . Hypertension Maternal Grandmother   . Depression Maternal  Grandmother   . Arthritis Maternal Grandmother   . Depression Mother   . Cancer Mother   . Bladder Cancer Mother   . Depression Sister     Past Surgical History:  Procedure Laterality Date  . CHOLECYSTECTOMY N/A 10/15/2014   Procedure: LAPAROSCOPIC CHOLECYSTECTOMY WITH INTRAOPERATIVE CHOLANGIOGRAM;  Surgeon: Judeth Horn, MD;  Location: [facility];  Service: General;  Laterality: N/A;   Social History   Occupational History  . [employer]     Social History Main Topics  . Smoking status: Never Smoker  . Smokeless tobacco: Never Used  .  Alcohol use No  . Drug use: No  . Sexual activity: No

## 2016-10-12 ENCOUNTER — Ambulatory Visit (HOSPITAL_COMMUNITY): Payer: BLUE CROSS/BLUE SHIELD | Admitting: Licensed Clinical Social Worker

## 2016-10-28 ENCOUNTER — Encounter (INDEPENDENT_AMBULATORY_CARE_PROVIDER_SITE_OTHER): Payer: Self-pay

## 2016-10-28 ENCOUNTER — Ambulatory Visit (INDEPENDENT_AMBULATORY_CARE_PROVIDER_SITE_OTHER): Payer: BLUE CROSS/BLUE SHIELD | Admitting: Specialist

## 2016-11-09 ENCOUNTER — Ambulatory Visit: Payer: BLUE CROSS/BLUE SHIELD | Admitting: Diagnostic Neuroimaging

## 2016-11-10 ENCOUNTER — Encounter: Payer: Self-pay | Admitting: Diagnostic Neuroimaging

## 2017-04-22 ENCOUNTER — Telehealth (INDEPENDENT_AMBULATORY_CARE_PROVIDER_SITE_OTHER): Payer: Self-pay | Admitting: *Deleted

## 2017-04-22 NOTE — Telephone Encounter (Signed)
-----   Message from Lizbeth BarkMandesia R Hairston, FNP sent at 03/30/2017  1:30 PM EST ----- Regarding: Patient Letter Hello,  Received letter this week from [employer] stating they sent letter to office on 02/18/17 with mail and fax cover sheet requesting records be sent according to instructions on cover sheet. Provider never received it, please check with front office staff. If not at front office please contact [phone] and request that another be sent.  Sincerely,  Arrie SenateMandesia Hairston FNP-BC

## 2017-04-22 NOTE — Telephone Encounter (Signed)
MA contacted disability to request another letter be faxed on behalf of patient. Luan MooreSanders Simmons was the voicemail MA was transferred to with no answer and no voicemail option. MA transferred to general operator. Graciella BeltonDianne states patients records were received on 03/13/17. No further questions at this time.

## 2017-05-04 ENCOUNTER — Other Ambulatory Visit: Payer: Self-pay | Admitting: Family Medicine

## 2018-03-18 IMAGING — DX DG CHEST 2V
2 series · 2 of 2 positions shown · non-contrast
Comparison: 02/05/2016, 11/22/2013 and earlier.

CLINICAL DATA: 26-year-old with acute onset of chest pain and right
upper extremity tingling associated with shortness of breath and
nausea. Nonsmoker.

EXAM:
CHEST  2 VIEW

[w chest pa]
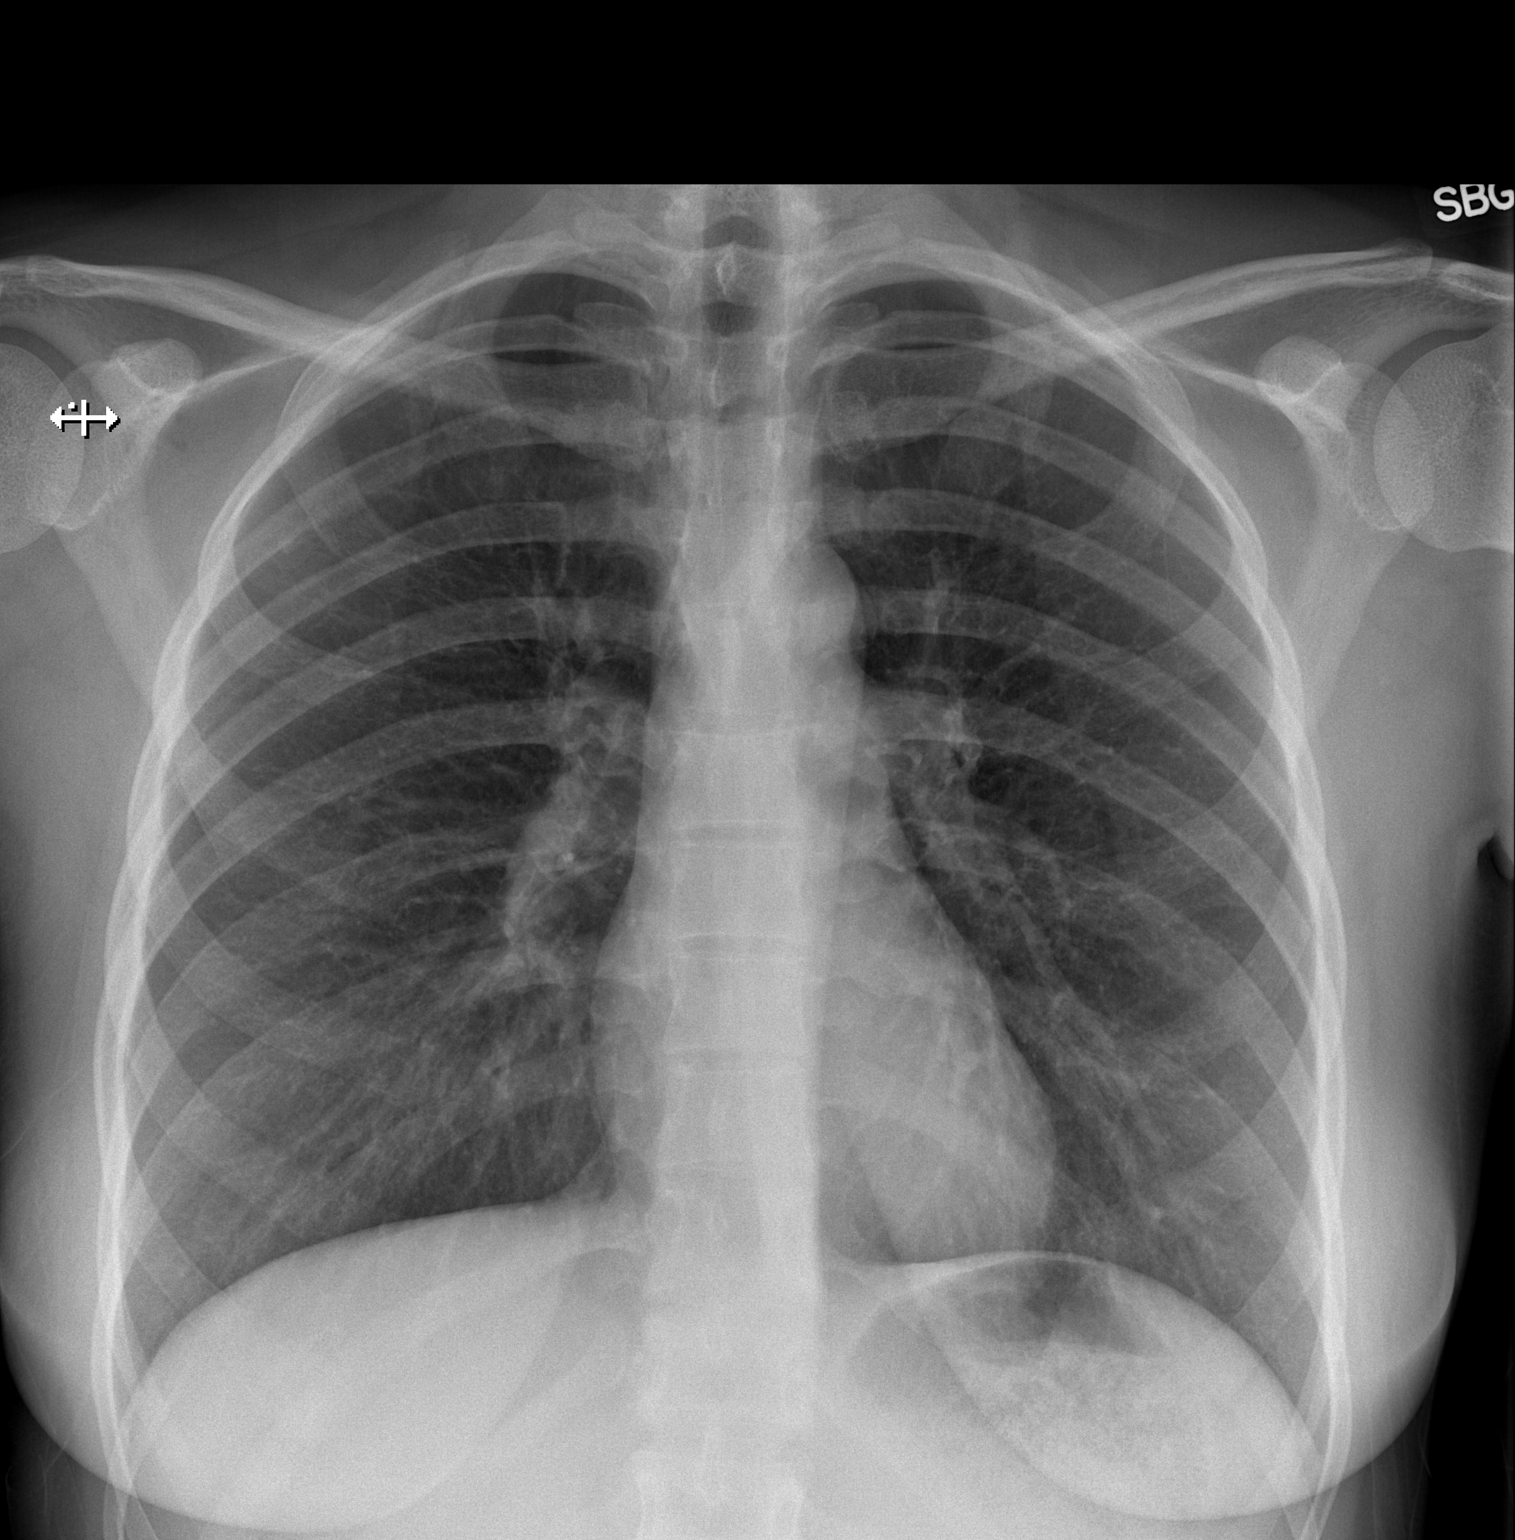

[w chest lat]
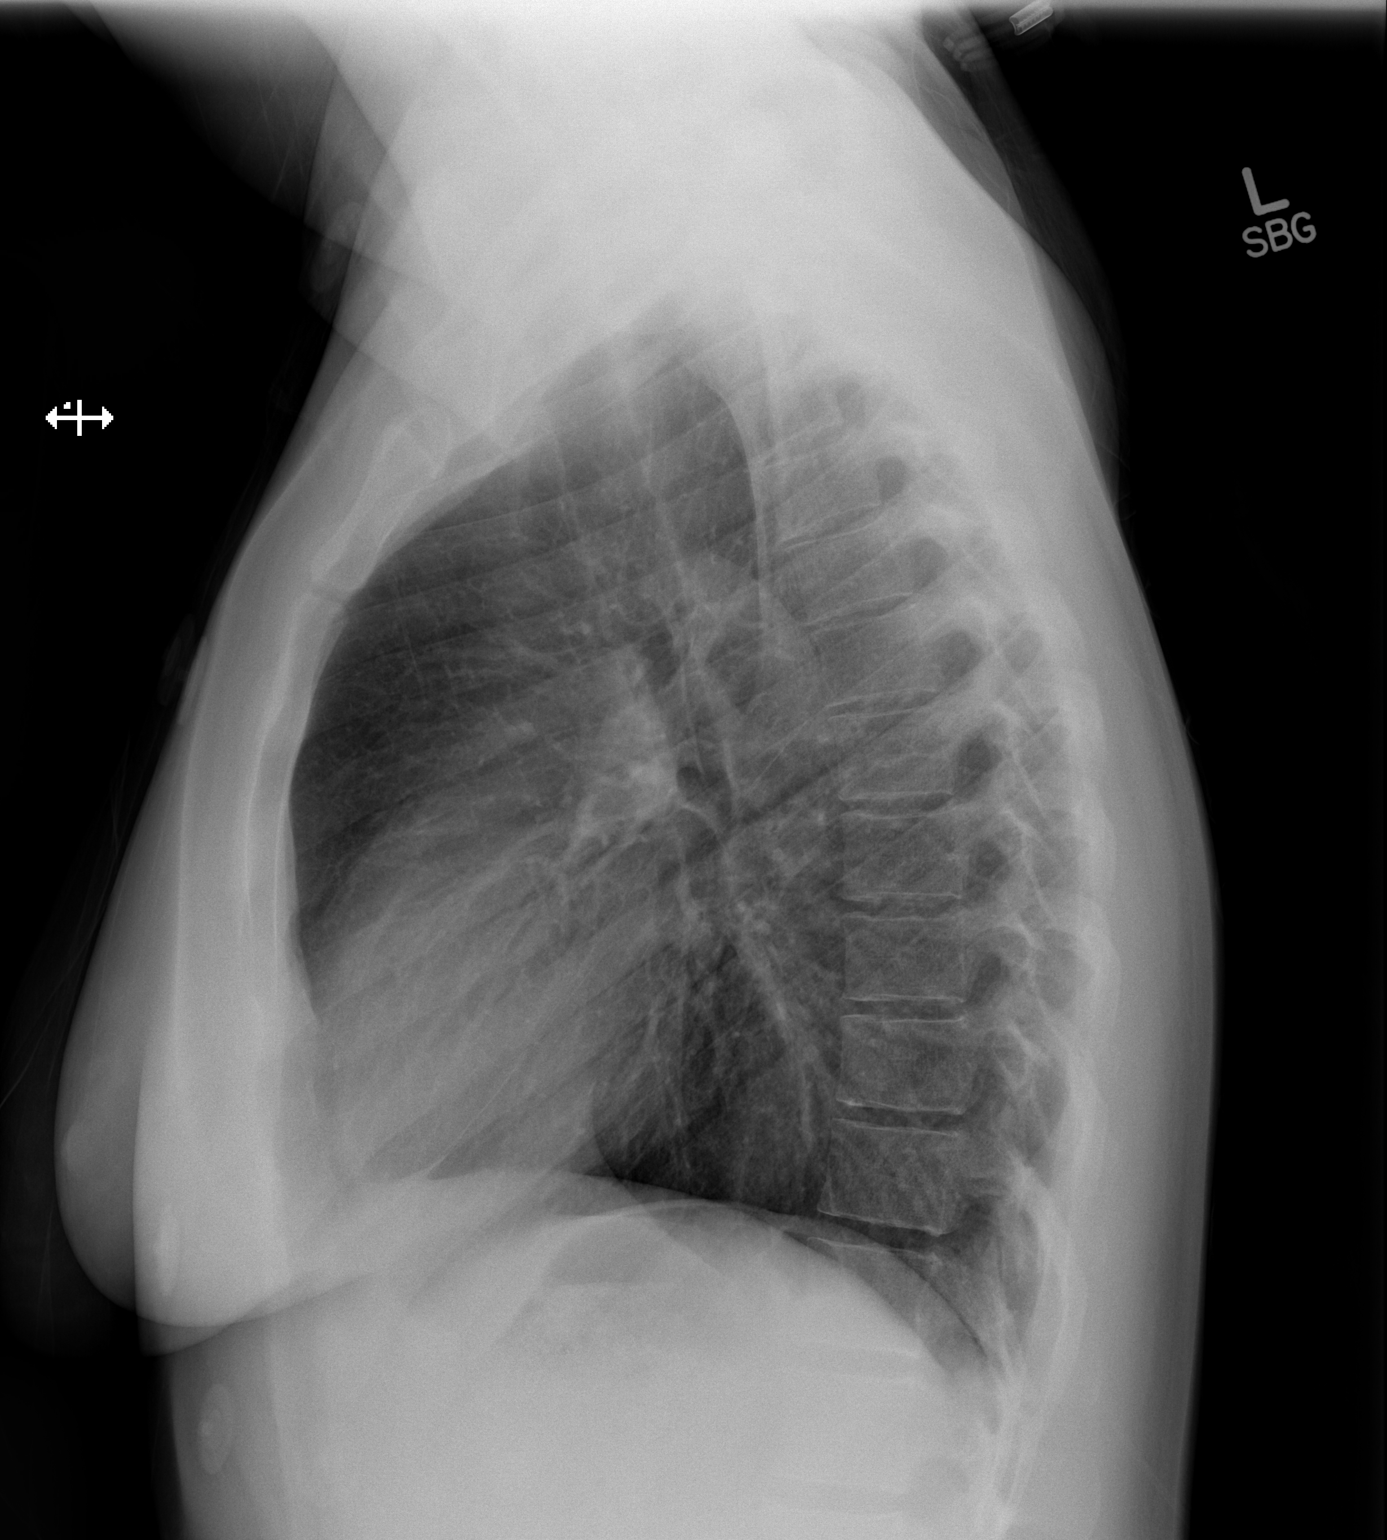

[2 of 2 positions shown; findings below may reference images not displayed]

FINDINGS: Cardiomediastinal silhouette unremarkable. Lungs clear.
Bronchovascular markings normal. Pulmonary vascularity normal. No
pneumothorax. No pleural effusions. Visualized bony thorax intact.
No interval change.
IMPRESSION: Normal examination.

## 2018-12-08 ENCOUNTER — Ambulatory Visit (HOSPITAL_COMMUNITY)
Admission: EM | Admit: 2018-12-08 | Discharge: 2018-12-08 | Disposition: A | Discharge status: Home / Self Care | Payer: Medicaid Other | Attending: Family Medicine | Admitting: Family Medicine

## 2018-12-08 ENCOUNTER — Encounter (HOSPITAL_COMMUNITY): Payer: Self-pay | Admitting: Emergency Medicine

## 2018-12-08 DIAGNOSIS — M940 Chondrocostal junction syndrome [Tietze]: Secondary | ICD-10-CM

## 2018-12-08 HISTORY — DX: Unspecified osteoarthritis, unspecified site: M19.90

## 2018-12-08 HISTORY — DX: Unspecified asthma, uncomplicated: J45.909

## 2018-12-08 MED ORDER — METHYLPREDNISOLONE ACETATE 40 MG/ML IJ SUSP
40.0000 mg | Freq: Once | INTRAMUSCULAR | Status: AC
  Administered 2018-12-08: 40 mg via INTRAMUSCULAR

## 2018-12-08 MED ORDER — METHYLPREDNISOLONE ACETATE 40 MG/ML IJ SUSP
INTRAMUSCULAR | Status: AC
  Filled 2018-12-08: qty 1

## 2018-12-08 NOTE — ED Triage Notes (Signed)
Pt c/o Rib cage pain x4 days, states hx of rheumatoid arthritis and feels like that but the pain is really bad.

## 2018-12-08 NOTE — Discharge Instructions (Signed)
Please follow up if your symptoms fail to improve  Please seek immediate care if your symptoms worsen

## 2018-12-08 NOTE — ED Provider Notes (Signed)
[facility]    CSN: [id number] Arrival date & time: 12/08/18  1841      History   Chief Complaint Chief Complaint  Patient presents with  . [id number] Pain    HPI Tanya Sanchez is a 28 y.o. female.   She is presenting with anterior rib pain and posterior rib pain.  This been ongoing for a few days.  She reports having a history of some form of arthritis.  She has been taking meloxicam with limited improvement.  Denies any sick shortness of breath.  Pain is sharp and stabbing and can be severe.  Is intermittent in nature.  She denies any diaphoresis or radiation to left shoulder.  No history of cardiac illness.  No trauma to the chest.  HPI  Past Medical History:  Diagnosis Date  . ADHD (attention deficit hyperactivity disorder)   . Arthritis   . Asthma   . Gallstones   . Panic attack 2016    Patient Active Problem List   Diagnosis Date Noted  . Lumbar herniated disc 08/28/2016  . Facet hypertrophy of lumbar region 08/28/2016  . Symptomatic cholelithiasis 10/14/2014  . Epigastric abdominal pain 10/11/2014  . Chronic tension headaches 10/11/2014  . Vitamin D deficiency 07/31/2014  . Panic attacks 07/30/2014  . Poor dentition 07/30/2014  . Allergic rhinitis 07/30/2014    Past Surgical History:  Procedure Laterality Date  . CHOLECYSTECTOMY N/A 10/15/2014   Procedure: LAPAROSCOPIC CHOLECYSTECTOMY WITH INTRAOPERATIVE CHOLANGIOGRAM;  Surgeon: Jimmye NormanJames Wyatt, MD;  [facility];  Service: General;  Laterality: N/A;    OB History   No obstetric history on file.      Home Medications    Prior to Admission medications   Medication Sig Start Date End Date Taking? Authorizing Provider  meloxicam (MOBIC) 15 MG tablet Take 1 tablet (15 mg total) by mouth daily. TAKE WITH MEALS Patient taking differently: Take 15 mg by mouth daily as needed for pain. TAKE WITH MEALS 09/10/16   Kerrin ChampagneNitka, James E, MD  methocarbamol (ROBAXIN) 500 MG tablet Take 1 tablet (500 mg total) by  mouth 2 (two) times daily. 09/23/16   Garlon HatchetSanders, Lisa M, PA-C  naproxen (NAPROSYN) 500 MG tablet Take 1 tablet (500 mg total) by mouth 2 (two) times daily with a meal. 09/23/16   Garlon HatchetSanders, Lisa M, PA-C    Family History Family History  Problem Relation Age of Onset  . Heart disease Maternal Grandmother   . Hypertension Maternal Grandmother   . Depression Maternal Grandmother   . Arthritis Maternal Grandmother   . Depression Mother   . Cancer Mother   . Bladder Cancer Mother   . Depression Sister     Social History Social History   Tobacco Use  . Smoking status: Never Smoker  . Smokeless tobacco: Never Used  Substance Use Topics  . Alcohol use: No  . Drug use: No     Allergies   Patient has no known allergies.   Review of Systems Review of Systems  Constitutional: Negative for fever.  HENT: Negative for congestion.   Respiratory: Negative for cough.   Cardiovascular: Positive for chest pain.  Gastrointestinal: Negative for abdominal pain.  Musculoskeletal: Positive for back pain.  Skin: Negative for color change.  Neurological: Negative for weakness.  Hematological: Negative for adenopathy.     Physical Exam Triage Vital Signs ED Triage Vitals  Enc Vitals Group     BP 12/08/18 1903 129/84     Pulse Rate 12/08/18 1903 90  Resp 12/08/18 1903 18     Temp 12/08/18 1903 98.5 F (36.9 C)     Temp src --      SpO2 12/08/18 1903 97 %     Weight --      Height --      Head Circumference --      Peak Flow --      Pain Score 12/08/18 1904 9     Pain Loc --      Pain Edu? --      Excl. in [place]? --    No data found.  Updated Vital Signs BP 129/84   Pulse 90   Temp 98.5 F (36.9 C)   Resp 18   LMP 11/22/2018   SpO2 97%   Visual Acuity Right Eye Distance:   Left Eye Distance:   Bilateral Distance:    Right Eye Near:   Left Eye Near:    Bilateral Near:     Physical Exam Gen: NAD, alert, cooperative with exam, well-appearing ENT: normal lips, normal  nasal mucosa,  Eye: normal EOM, normal conjunctiva and lids CV:  no edema, +2 pedal pulses, regular rate and rhythm Resp: no accessory muscle use, non-labored, clear to auscultation Skin: no rashes, no areas of induration  Neuro: normal tone, normal sensation to touch Psych:  normal insight, alert and oriented MSK:  Some tenderness to palpation of the midline thoracic spine. Some tenderness on the periscapular region on the left and right of the back. Some clavicle tenderness to palpation. No significant sternal tenderness.    UC Treatments / Results  Labs (all labs ordered are listed, but only abnormal results are displayed) Labs Reviewed - No data to display  EKG   Radiology No results found.  Procedures Procedures (including critical care time)  Medications Ordered in UC Medications  methylPREDNISolone acetate (DEPO-MEDROL) injection 40 mg (40 mg Intramuscular Given 12/08/18 1950)  methylPREDNISolone acetate (DEPO-MEDROL) 40 MG/ML injection (has no administration in time range)    Initial Impression / Assessment and Plan / UC Course  I have reviewed the triage vital signs and the nursing notes.  Pertinent labs & imaging results that were available during my care of the patient were reviewed by me and considered in my medical decision making (see chart for details).     Tanya Sanchez is a 28 year old female is presenting with symptoms suggestive costochondritis.  She reports a history of polyarthritis and has an appointment with rheumatology.  Will provide with intramuscular Depo-Medrol.  Counseled on supportive care.  Given indications to follow-up and return and seek immediate care.  Final Clinical Impressions(s) / UC Diagnoses   Final diagnoses:  Costochondritis     Discharge Instructions     Please follow up if your symptoms fail to improve  Please seek immediate care if your symptoms worsen     ED Prescriptions    None     PDMP not reviewed this encounter.    Rosemarie Ax, MD 12/08/18 2027

## 2020-10-22 ENCOUNTER — Other Ambulatory Visit: Payer: Self-pay

## 2020-10-22 ENCOUNTER — Emergency Department (HOSPITAL_COMMUNITY)
Admission: EM | Admit: 2020-10-22 | Discharge: 2020-10-22 | Disposition: A | Discharge status: Home / Self Care | Payer: Medicaid Other | Attending: Physician Assistant | Admitting: Physician Assistant

## 2020-10-22 DIAGNOSIS — J45909 Unspecified asthma, uncomplicated: Secondary | ICD-10-CM | POA: Insufficient documentation

## 2020-10-22 DIAGNOSIS — M546 Pain in thoracic spine: Secondary | ICD-10-CM | POA: Diagnosis not present

## 2020-10-22 DIAGNOSIS — M25531 Pain in right wrist: Secondary | ICD-10-CM | POA: Insufficient documentation

## 2020-10-22 DIAGNOSIS — X500XXA Overexertion from strenuous movement or load, initial encounter: Secondary | ICD-10-CM | POA: Insufficient documentation

## 2020-10-22 DIAGNOSIS — M7918 Myalgia, other site: Secondary | ICD-10-CM

## 2020-10-22 MED ORDER — NAPROXEN 500 MG PO TABS
500.0000 mg | ORAL_TABLET | Freq: Two times a day (BID) | ORAL | 0 refills | Status: AC
  Filled 2020-10-22: qty 14, 7d supply, fill #0

## 2020-10-22 MED ORDER — METHOCARBAMOL 500 MG PO TABS
500.0000 mg | ORAL_TABLET | Freq: Two times a day (BID) | ORAL | 0 refills | Status: DC
  Filled 2020-10-22: qty 20, 10d supply, fill #0

## 2020-10-22 NOTE — Discharge Instructions (Addendum)

## 2020-10-22 NOTE — ED Triage Notes (Signed)
Pt here from home with c/o right wrist pain along with back pain after helping her sister move last week

## 2020-10-22 NOTE — ED Provider Notes (Signed)
[facility] EMERGENCY DEPARTMENT Provider Note   CSN: [id number] Arrival date & time: 10/22/20  1148     History No chief complaint on file.   Tanya Sanchez is a 30 y.o. female.  HPI  30 year old with history of HTN, arthritis, gallstones, right wrist pain and left upper back states that she recently moved here a week ago and was doing a lot of heavy lifting.  She then developed the right wrist pain and left back pain.  States the pain shoots from the low back to the upper back.  She has had no associated numbness or weakness, loss control of bowel or bladder function, fevers, history of IV drug use or cancer.  Pain is constant and severe in nature.  She denies any chest pain or persistent shortness of breath.  Past Medical History:  Diagnosis Date   ADHD (attention deficit hyperactivity disorder)    Arthritis    Asthma    Gallstones    Panic attack 2016    Patient Active Problem List   Diagnosis Date Noted   Lumbar herniated disc 08/28/2016   Facet hypertrophy of lumbar region 08/28/2016   Symptomatic cholelithiasis 10/14/2014   Epigastric abdominal pain 10/11/2014   Chronic tension headaches 10/11/2014   Vitamin D deficiency 07/31/2014   Panic attacks 07/30/2014   Poor dentition 07/30/2014   Allergic rhinitis 07/30/2014    Past Surgical History:  Procedure Laterality Date   CHOLECYSTECTOMY N/A 10/15/2014   Procedure: LAPAROSCOPIC CHOLECYSTECTOMY WITH INTRAOPERATIVE CHOLANGIOGRAM;  Surgeon: Jimmye Norman, MD;  Location: [facility] OR;  Service: General;  Laterality: N/A;     OB History   No obstetric history on file.     Family History  Problem Relation Age of Onset   Heart disease Maternal Grandmother    Hypertension Maternal Grandmother    Depression Maternal Grandmother    Arthritis Maternal Grandmother    Depression Mother    Cancer Mother    Bladder Cancer Mother    Depression Sister     Social History   Tobacco Use   Smoking status:  Never   Smokeless tobacco: Never  Substance Use Topics   Alcohol use: No   Drug use: No    Home Medications Prior to Admission medications   Medication Sig Start Date End Date Taking? Authorizing Provider  methocarbamol (ROBAXIN) 500 MG tablet Take 1 tablet (500 mg total) by mouth 2 (two) times daily. 10/22/20  Yes Priscillia Fouch S, PA-C  naproxen (NAPROSYN) 500 MG tablet Take 1 tablet (500 mg total) by mouth 2 (two) times daily for 7 days. 10/22/20 10/29/20 Yes Avanelle Pixley S, PA-C  meloxicam (MOBIC) 15 MG tablet Take 1 tablet (15 mg total) by mouth daily. TAKE WITH MEALS Patient taking differently: Take 15 mg by mouth daily as needed for pain. TAKE WITH MEALS 09/10/16   Kerrin Champagne, MD  methocarbamol (ROBAXIN) 500 MG tablet Take 1 tablet (500 mg total) by mouth 2 (two) times daily. 09/23/16   Garlon Hatchet, PA-C  naproxen (NAPROSYN) 500 MG tablet Take 1 tablet (500 mg total) by mouth 2 (two) times daily with a meal. 09/23/16   Garlon Hatchet, PA-C    Allergies    Patient has no known allergies.  Review of Systems   Review of Systems  Constitutional:  Negative for fever.  Respiratory:  Positive for shortness of breath (only when pain severe).   Cardiovascular:  Negative for chest pain.  Gastrointestinal:  Negative for nausea and  vomiting.  Genitourinary:  Negative for dysuria and frequency.  Musculoskeletal:  Positive for back pain.       Right wrist pain  Neurological:  Negative for weakness and numbness.   Physical Exam Updated Vital Signs BP 126/82 (BP Location: Left Arm)   Pulse 80   Temp 97.9 F (36.6 C) (Oral)   Resp 14   SpO2 99%   Physical Exam Vitals and nursing note reviewed.  Constitutional:      General: She is not in acute distress.    Appearance: She is well-developed.  HENT:     Head: Normocephalic and atraumatic.  Eyes:     Conjunctiva/sclera: Conjunctivae normal.  Cardiovascular:     Rate and Rhythm: Normal rate.  Pulmonary:     Effort: Pulmonary  effort is normal.  Musculoskeletal:        General: Normal range of motion.     Cervical back: Neck supple.     Comments: No midline spinal ttp. TTP noted to the left mid thoracic paraspinous muscles which reproduces pain. No bony tenderness noted to the right wrist. FROM to the right wrist. Pulses intact.   Skin:    General: Skin is warm and dry.  Neurological:     Mental Status: She is alert.     Comments: 5/5 strength to the BUE and BLE with normal sensation throughout    ED Results / Procedures / Treatments   Labs (all labs ordered are listed, but only abnormal results are displayed) Labs Reviewed - No data to display  EKG None  Radiology No results found.  Procedures Procedures   Medications Ordered in ED Medications - No data to display  ED Course  I have reviewed the triage vital signs and the nursing notes.  Pertinent labs & imaging results that were available during my care of the patient were reviewed by me and considered in my medical decision making (see chart for details).    MDM Rules/Calculators/A&P                          Pt here with mult msk complaints after moving her home. She has muscle TTP to the left mid back. She has not bony ttp to the wrist and has painless rom. She is nvi. Strength/sensation intact throughout and there are no neuro deficits. Feels sxs are msk related from her recent move with heavy lifting. Rx given for muscle relaxers and antiinflammatories. Advised on f/u and return precautions. She voices understanding and is in agreement with the plan. All questions answered, pt stable for discharge.    Final Clinical Impression(s) / ED Diagnoses Final diagnoses:  Musculoskeletal pain    Rx / DC Orders ED Discharge Orders          Ordered    methocarbamol (ROBAXIN) 500 MG tablet  2 times daily        10/22/20 1217    naproxen (NAPROSYN) 500 MG tablet  2 times daily        10/22/20 1217             Karrie Meres,  PA-C 10/22/20 1230    Terald Sleeper, MD 10/22/20 [phone]

## 2020-11-27 ENCOUNTER — Emergency Department (HOSPITAL_COMMUNITY)
Admission: EM | Admit: 2020-11-27 | Discharge: 2020-11-28 | Disposition: A | Discharge status: Home / Self Care | Payer: Medicaid Other | Attending: Emergency Medicine | Admitting: Emergency Medicine

## 2020-11-27 ENCOUNTER — Emergency Department (HOSPITAL_COMMUNITY): Payer: Medicaid Other

## 2020-11-27 ENCOUNTER — Other Ambulatory Visit: Payer: Self-pay

## 2020-11-27 DIAGNOSIS — M25551 Pain in right hip: Secondary | ICD-10-CM | POA: Insufficient documentation

## 2020-11-27 DIAGNOSIS — J45909 Unspecified asthma, uncomplicated: Secondary | ICD-10-CM | POA: Diagnosis not present

## 2020-11-27 MED ORDER — ACETAMINOPHEN 325 MG PO TABS
650.0000 mg | ORAL_TABLET | Freq: Once | ORAL | Status: AC
  Administered 2020-11-27: 650 mg via ORAL
  Filled 2020-11-27: qty 2

## 2020-11-27 NOTE — ED Provider Notes (Signed)
Emergency Medicine Provider Triage Evaluation Note  Tanya Sanchez , a 30 y.o. female  was evaluated in triage.  Pt complains of right hip pain with radiation down the right leg.  No trauma.  No weakness to the right lower extremity.  Started earlier today out of the blue.  Review of Systems  Positive:  Negative: Fever, chills, weakness, numbness   Physical Exam  BP (!) 148/79 (BP Location: Right Arm)   Pulse 84   Temp 98.3 F (36.8 C)   Resp 18   SpO2 100%  Gen:   Awake, no distress   Resp:  Normal effort  MSK:   Moves extremities without difficulty  Other:  Positive straight leg raise on the right, no midline tenderness to the cervical/thoracic/lumbar spine  Medical Decision Making  Medically screening exam initiated at 6:51 PM.  Appropriate orders placed.  Shonte Cart was informed that the remainder of the evaluation will be completed by another provider, this initial triage assessment does not replace that evaluation, and the importance of remaining in the ED until their evaluation is complete.     Teressa Lower, PA-C 11/27/20 1853    Sloan Leiter, DO 11/27/20 2337

## 2020-11-27 NOTE — ED Triage Notes (Signed)
Pt here with c/o right hip pain , after starting to work at [employer]

## 2020-11-28 ENCOUNTER — Other Ambulatory Visit: Payer: Self-pay

## 2020-11-28 MED ORDER — METHOCARBAMOL 500 MG PO TABS
500.0000 mg | ORAL_TABLET | Freq: Two times a day (BID) | ORAL | 0 refills | Status: DC
  Filled 2020-11-28: qty 20, 10d supply, fill #0

## 2020-11-28 MED ORDER — NAPROXEN 500 MG PO TABS
500.0000 mg | ORAL_TABLET | Freq: Two times a day (BID) | ORAL | 0 refills | Status: DC
  Filled 2020-11-28: qty 30, 15d supply, fill #0

## 2020-11-28 MED ORDER — NAPROXEN 250 MG PO TABS
375.0000 mg | ORAL_TABLET | Freq: Once | ORAL | Status: AC
  Administered 2020-11-28: 375 mg via ORAL
  Filled 2020-11-28: qty 2

## 2020-11-28 NOTE — Discharge Instructions (Addendum)
You may use over-the-counter Motrin (Ibuprofen), Acetaminophen (Tylenol), topical muscle creams such as SalonPas, Icy Hot, Bengay, etc. Please stretch, apply ice or heat (whichever helps), and have massage therapy for additional assistance.  

## 2020-11-28 NOTE — ED Provider Notes (Signed)
[facility] EMERGENCY DEPARTMENT Provider Note  CSN: [id number] Arrival date & time: 11/27/20 1642  Chief Complaint(s) No chief complaint on file.  HPI Tanya Sanchez is a 30 y.o. female with a past medical history listed below including herniated lumbar disc resulting in muscle spasms presents to the ED with 2 days of right hip pain.  Pain is aching and sharp.  Located in the anterior lateral portion.  Radiates down the anterior portion of the thigh.  Worse with movement and palpation.  Alleviated briefly with Tylenol.  Also improved with certain positions.  She denies any falls or trauma.  No heavy lifting.  Reports that she woke up yesterday morning with the discomfort which gradually worsened.  No bladder/bowel incontinence.  No lower extremity weakness or loss of sensation.  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 Authorizing Provider  meloxicam (MOBIC) 15 MG tablet Take 1 tablet (15 mg total) by mouth daily. TAKE WITH MEALS Patient taking differently: Take 15 mg by mouth daily as needed for pain. TAKE WITH MEALS 09/10/16   Kerrin Champagne, MD  methocarbamol (ROBAXIN) 500 MG tablet Take 1 tablet (500 mg total) by mouth 2 (two) times daily. 09/23/16   Garlon Hatchet, PA-C  methocarbamol (ROBAXIN) 500 MG tablet Take 1 tablet (500 mg total) by mouth 2 (two) times  daily. 10/22/20   Couture, Cortni S, PA-C  naproxen (NAPROSYN) 500 MG tablet Take 1 tablet (500 mg total) by mouth 2 (two) times daily with a meal. 09/23/16   Garlon Hatchet, PA-C                                                                                                                                    Past Surgical History Past Surgical History:  Procedure Laterality Date   CHOLECYSTECTOMY N/A 10/15/2014   Procedure: LAPAROSCOPIC CHOLECYSTECTOMY WITH INTRAOPERATIVE CHOLANGIOGRAM;  Surgeon: Jimmye Norman, MD;  Location: [facility] OR;  Service: General;  Laterality: N/A;   Family History Family History  Problem Relation Age of Onset   Heart disease Maternal Grandmother    Hypertension Maternal Grandmother    Depression Maternal Grandmother    Arthritis Maternal Grandmother    Depression Mother    Cancer Mother    Bladder Cancer Mother  Depression Sister     Social History Social History   Tobacco Use   Smoking status: Never   Smokeless tobacco: Never  Substance Use Topics   Alcohol use: No   Drug use: No   Allergies Patient has no known allergies.  Review of Systems Review of Systems All other systems are reviewed and are negative for acute change except as noted in the HPI  Physical Exam Vital Signs  I have reviewed the triage vital signs BP (!) 135/93   Pulse 78   Temp 98.3 F (36.8 C)   Resp 16   SpO2 100%   Physical Exam Vitals reviewed.  Constitutional:      General: She is not in acute distress.    Appearance: She is well-developed. She is not diaphoretic.  HENT:     Head: Normocephalic and atraumatic.     Right Ear: External ear normal.     Left Ear: External ear normal.     Nose: Nose normal.  Eyes:     General: No scleral icterus.    Conjunctiva/sclera: Conjunctivae normal.  Neck:     Trachea: Phonation normal.  Cardiovascular:     Rate and Rhythm: Normal rate and regular rhythm.  Pulmonary:     Effort: Pulmonary effort is normal. No respiratory  distress.     Breath sounds: No stridor.  Abdominal:     General: There is no distension.  Musculoskeletal:        General: Normal range of motion.     Cervical back: Normal range of motion.     Lumbar back: Spasms and tenderness present. No bony tenderness.       Back:     Right hip: Tenderness present. No bony tenderness. Normal strength.       Legs:  Neurological:     Mental Status: She is alert and oriented to person, place, and time.     Comments: Spine Exam: Strength: 5/5 throughout LE bilaterally  Sensation: Intact to light touch in proximal and distal LE bilaterally   Psychiatric:        Behavior: Behavior normal.    ED Results and Treatments Labs (all labs ordered are listed, but only abnormal results are displayed) Labs Reviewed - No data to display                                                                                                                       EKG  EKG Interpretation  Date/Time:    Ventricular Rate:    PR Interval:    QRS Duration:   QT Interval:    QTC Calculation:   R Axis:     Text Interpretation:         Radiology DG Knee 2 Views Right  Result Date: 11/27/2020 CLINICAL DATA:  Right knee pain.  No known injury. EXAM: RIGHT KNEE - 1-2 VIEW COMPARISON:  None. FINDINGS: No evidence of fracture, dislocation, or joint effusion. Normal joint spaces and alignment. No evidence  of arthropathy or other focal bone abnormality. Soft tissues are unremarkable. IMPRESSION: Negative radiographs of the right knee. Electronically Signed   By: Narda Rutherford M.D.   On: 11/27/2020 19:45   DG Hip Unilat W or Wo Pelvis 2-3 Views Right  Result Date: 11/27/2020 CLINICAL DATA:  Pain. EXAM: DG HIP (WITH OR WITHOUT PELVIS) 2-3V RIGHT COMPARISON:  None. FINDINGS: There is no evidence of hip fracture or dislocation. There is no evidence of arthropathy or other focal bone abnormality. IMPRESSION: Negative. Electronically Signed   By: Darliss Cheney M.D.   On:  11/27/2020 19:45    Pertinent labs & imaging results that were available during my care of the patient were reviewed by me and considered in my medical decision making (see MDM for details).  Medications Ordered in ED Medications  acetaminophen (TYLENOL) tablet 650 mg (650 mg Oral Given 11/27/20 1850)                                                                                                                                     Procedures Procedures  (including critical care time)  Medical Decision Making / ED Course I have reviewed the nursing notes for this encounter and the patient's prior records (if available in EHR or on provided paperwork).  Tanya Sanchez was evaluated in Emergency Department on 11/28/2020 for the symptoms described in the history of present illness. She was evaluated in the context of the global COVID-19 pandemic, which necessitated consideration that the patient might be at risk for infection with the SARS-CoV-2 virus that causes COVID-19. Institutional protocols and algorithms that pertain to the evaluation of patients at risk for COVID-19 are in a state of rapid change based on information released by regulatory bodies including the CDC and federal and state organizations. These policies and algorithms were followed during the patient's care in the ED.     30 y.o. female presents with right hip area for 2 days. No acute traumatic onset. No red flag symptoms of fever, weight loss, saddle anesthesia, weakness, fecal/urinary incontinence or urinary retention.   Patient seen in the MSE process and had right hip and right knee plain films that were negative.  Suspect MSK etiology. Patient was recommended to take short course of scheduled NSAIDs and engage in early mobility as definitive treatment. Return precautions discussed for worsening or new concerning symptoms.   Pertinent labs & imaging results that were available during my care of the patient were  reviewed by me and considered in my medical decision making:     Final Clinical Impression(s) / ED Diagnoses Final diagnoses:  None   The patient appears reasonably screened and/or stabilized for discharge and I doubt any other medical condition or other [facility] requiring further screening, evaluation, or treatment in the ED at this time prior to discharge. Safe for discharge with strict return precautions.  Disposition: Discharge  Condition: Good  I have  discussed the results, Dx and Tx plan with the patient/family who expressed understanding and agree(s) with the plan. Discharge instructions discussed at length. The patient/family was given strict return precautions who verbalized understanding of the instructions. No further questions at time of discharge.    ED Discharge Orders          Ordered    methocarbamol (ROBAXIN) 500 MG tablet  2 times daily        11/28/20 0043    naproxen (NAPROSYN) 500 MG tablet  2 times daily with meals        11/28/20 0043              Follow Up: Primary care provider  Call  to schedule an appointment for close follow up     This chart was dictated using voice recognition software.  Despite best efforts to proofread,  errors can occur which can change the documentation meaning.    Nira Conn, MD 11/28/20 [id number]

## 2021-02-19 ENCOUNTER — Emergency Department (HOSPITAL_COMMUNITY)
Admission: EM | Admit: 2021-02-19 | Discharge: 2021-02-19 | Disposition: A | Discharge status: Home / Self Care | Payer: Medicaid Other | Attending: Emergency Medicine | Admitting: Emergency Medicine

## 2021-02-19 ENCOUNTER — Other Ambulatory Visit: Payer: Self-pay

## 2021-02-19 DIAGNOSIS — Z5321 Procedure and treatment not carried out due to patient leaving prior to being seen by health care provider: Secondary | ICD-10-CM | POA: Insufficient documentation

## 2021-02-19 DIAGNOSIS — R1110 Vomiting, unspecified: Secondary | ICD-10-CM | POA: Diagnosis present

## 2021-02-19 DIAGNOSIS — G43909 Migraine, unspecified, not intractable, without status migrainosus: Secondary | ICD-10-CM | POA: Insufficient documentation

## 2021-02-19 MED ORDER — METOCLOPRAMIDE HCL 5 MG/ML IJ SOLN
10.0000 mg | Freq: Once | INTRAMUSCULAR | Status: AC
  Administered 2021-02-19: 10 mg via INTRAMUSCULAR
  Filled 2021-02-19: qty 2

## 2021-02-19 MED ORDER — KETOROLAC TROMETHAMINE 15 MG/ML IJ SOLN
15.0000 mg | Freq: Once | INTRAMUSCULAR | Status: AC
  Administered 2021-02-19: 15 mg via INTRAMUSCULAR
  Filled 2021-02-19: qty 1

## 2021-02-19 NOTE — ED Notes (Signed)
Called pt x3 for vitals, no response. 

## 2021-02-19 NOTE — ED Notes (Signed)
Jane(EMT) called for pt x3 for vitals, no response.

## 2021-02-19 NOTE — ED Triage Notes (Signed)
Per EMS, pt has had a migraine for past two days, w/ vomiting.  No medication has helped. Hx of same.    151/102 85P R16 99%  RA 106CBG

## 2021-02-19 NOTE — ED Provider Notes (Signed)
Emergency Medicine Provider Triage Evaluation Note  Tanya Sanchez , a 30 y.o. female  was evaluated in triage.  Pt complains of frontal headache which wraps around to the back of her head. Onset yesterday at 4am. Associated photophobia, intermittent blurry vision, nausea, vomiting. Tried taking OTC medications, but vomited shortly after. No associated fevers, head trauma.  Review of Systems  Positive: As above Negative: As above  Physical Exam  BP (!) 147/106 (BP Location: Right Arm)   Pulse (!) 101   Temp 98.6 F (37 C) (Oral)   Resp 17   SpO2 100%  Gen:   Awake, no distress   Resp:  Normal effort  MSK:   Moves extremities without difficulty  Other:  No obvious focal deficits noted. No meningismus.  Medical Decision Making  Medically screening exam initiated at 6:39 AM.  Appropriate orders placed.  Tanya Sanchez was informed that the remainder of the evaluation will be completed by another provider, this initial triage assessment does not replace that evaluation, and the importance of remaining in the ED until their evaluation is complete.  Headache without focal deficits in triage. Will attempt medical management pending formal evaluation.   Antony Madura, PA-C 02/19/21 7517    Nira Conn, MD 02/19/21 [phone]

## 2021-02-19 NOTE — ED Triage Notes (Signed)
Pt came via POV, after pt reports c/o ongoing migraine as of 16:30. Pt endorses vomiting ( x 6 times). Pt takes Excedrin without relief.

## 2021-03-20 ENCOUNTER — Other Ambulatory Visit: Payer: Self-pay

## 2021-03-20 MED ORDER — TOPIRAMATE 25 MG PO TABS
50.0000 mg | ORAL_TABLET | Freq: Every day | ORAL | 0 refills | Status: DC
  Filled 2021-03-20: qty 60, 30d supply, fill #0

## 2021-04-15 ENCOUNTER — Encounter (HOSPITAL_COMMUNITY): Payer: Self-pay

## 2021-04-15 ENCOUNTER — Emergency Department (HOSPITAL_COMMUNITY)
Admission: EM | Admit: 2021-04-15 | Discharge: 2021-04-16 | Disposition: A | Discharge status: Home / Self Care | Payer: Medicaid Other | Attending: Emergency Medicine | Admitting: Emergency Medicine

## 2021-04-15 DIAGNOSIS — W01198A Fall on same level from slipping, tripping and stumbling with subsequent striking against other object, initial encounter: Secondary | ICD-10-CM | POA: Insufficient documentation

## 2021-04-15 DIAGNOSIS — M791 Myalgia, unspecified site: Secondary | ICD-10-CM | POA: Insufficient documentation

## 2021-04-15 DIAGNOSIS — M25552 Pain in left hip: Secondary | ICD-10-CM | POA: Diagnosis not present

## 2021-04-15 DIAGNOSIS — M25512 Pain in left shoulder: Secondary | ICD-10-CM | POA: Diagnosis present

## 2021-04-15 MED ORDER — METHOCARBAMOL 500 MG PO TABS
500.0000 mg | ORAL_TABLET | Freq: Two times a day (BID) | ORAL | 0 refills | Status: DC
  Filled 2021-04-15 – 2021-04-22 (×2): qty 20, 10d supply, fill #0

## 2021-04-15 NOTE — ED Provider Notes (Signed)
[facility] EMERGENCY DEPARTMENT Provider Note   CSN: [id number] Arrival date & time: 04/15/21  2246     History  Chief Complaint  Patient presents with   Left Side Pain    Tanya Sanchez is a 31 y.o. female.  HPI Pt complains of left side pain after fall 3 days ago. She states she tripped and fell to the cement and landed on her left hip/shoulder and has had pain since. Lidocaine topical and excedrin and muscle relaxer.   She states that she still feels that she is having aches and pains.     She is specifically requesting a work note because she feels that lifting groceries and crates at work at [employer] is making her symptoms worse. Home Medications Prior to Admission medications   Medication Sig Start Date End Date Taking? Authorizing Provider  meloxicam (MOBIC) 15 MG tablet Take 1 tablet (15 mg total) by mouth daily. TAKE WITH MEALS Patient taking differently: Take 15 mg by mouth daily as needed for pain. TAKE WITH MEALS 09/10/16   Jessy Oto, MD  methocarbamol (ROBAXIN) 500 MG tablet Take 1 tablet (500 mg total) by mouth 2 (two) times daily. 04/15/21   Tedd Sias, PA  naproxen (NAPROSYN) 500 MG tablet Take 1 tablet (500 mg total) by mouth 2 (two) times daily with a meal. 11/28/20   Cardama, Grayce Sessions, MD  topiramate (TOPAMAX) 25 MG tablet Take 2 tablets (50 mg total) by mouth at bedtime. 03/19/21         Allergies    Prozac [fluoxetine]    Review of Systems   Review of Systems  Constitutional:  Negative for fever.  HENT:  Negative for congestion.   Respiratory:  Negative for shortness of breath.   Cardiovascular:  Negative for chest pain.  Gastrointestinal:  Negative for abdominal distention.  Musculoskeletal:  Positive for myalgias.  Neurological:  Negative for dizziness and headaches.   Physical Exam Updated Vital Signs BP (!) 135/95 (BP Location: Left Arm)    Pulse 75    Temp 98 F (36.7 C) (Oral)    Resp 20    Ht 5\' 2"   (1.575 m)    Wt 68 kg    SpO2 98%    BMI 27.42 kg/m  Physical Exam Vitals and nursing note reviewed.  Constitutional:      General: She is not in acute distress.    Appearance: Normal appearance. She is not ill-appearing.  HENT:     Head: Normocephalic and atraumatic.  Eyes:     General: No scleral icterus.       Right eye: No discharge.        Left eye: No discharge.     Conjunctiva/sclera: Conjunctivae normal.  Pulmonary:     Effort: Pulmonary effort is normal.     Breath sounds: No stridor.  Musculoskeletal:     Comments: There is some left-sided paracervical muscular tenderness.  No midline tenderness  No bony tenderness over joints or long bones of the upper and lower extremities.    No neck or back midline tenderness, step-off, deformity, or bruising. Able to turn head left and right 45 degrees without difficulty.  Full range of motion of upper and lower extremity joints shown after palpation was conducted; with 5/5 symmetrical strength in upper and lower extremities. No chest wall tenderness, no facial or cranial tenderness.   Patient has intact sensation grossly in lower and upper extremities. Intact patellar and ankle reflexes. Patient  able to ambulate without difficulty.  Radial and DP pulses palpated BL.     Neurological:     Mental Status: She is alert and oriented to person, place, and time. Mental status is at baseline.    ED Results / Procedures / Treatments   Labs (all labs ordered are listed, but only abnormal results are displayed) Labs Reviewed - No data to display  EKG None  Radiology No results found.  Procedures Procedures    Medications Ordered in ED Medications - No data to display  ED Course/ Medical Decision Making/ A&P                           Medical Decision Making Risk Prescription drug management.  With mechanical fall 3 days ago and seems to be having some muscle aches since then well-appearing on my examination does have some  muscular tenderness of the left-sided posterior neck.  No midline tenderness.  Doubt fracture.  She has a follow-up appoint with her primary care provider in 1 week.  Return precautions given.  Will treat symptomatically with muscle relaxers Tylenol ibuprofen recommendations given.  Doubt carotid artery or vertebral artery injury and she has no blurry vision double vision slurred speech confusion or weakness  Final Clinical Impression(s) / ED Diagnoses Final diagnoses:  Muscle pain    Rx / DC Orders ED Discharge Orders          Ordered    methocarbamol (ROBAXIN) 500 MG tablet  2 times daily        04/15/21 2325              Pati Gallo [place], [place] [id number]    Delora Fuel, MD [id number] [phone]

## 2021-04-15 NOTE — Discharge Instructions (Signed)
Your examination today is most concerning for a muscular injury ?1. Medications: alternate ibuprofen and tylenol for pain control, take all usual home medications as they are prescribed ?2. Treatment: rest, ice, elevate and use an ACE wrap or other compressive therapy to decrease swelling. Also drink plenty of fluids and do plenty of gentle stretching and move the affected muscle through its normal range of motion to prevent stiffness. ?3. Follow Up: If your symptoms do not improve please follow up with orthopedics/sports medicine or your PCP for discussion of your diagnoses and further evaluation after today's visit; if you do not have a primary care doctor use the resource guide provided to find one; Please return to the ER for worsening symptoms or other concerns.  ? ? ?Please use Tylenol or ibuprofen for pain.  You may use 600 mg ibuprofen every 6 hours or 1000 mg of Tylenol every 6 hours.  You may choose to alternate between the 2.  This would be most effective.  Not to exceed 4 g of Tylenol within 24 hours.  Not to exceed 3200 mg ibuprofen 24 hours.   ?

## 2021-04-15 NOTE — ED Triage Notes (Signed)
Pt arrives POV for eval of L side body pain. Pt reports she tripped over her shoelaces 3 days ago and now has pain to entire L side of body. Denies LOC, denies striking head. States pain from neck down to L hip

## 2021-04-15 NOTE — ED Provider Triage Note (Signed)
Emergency Medicine Provider Triage Evaluation Note  Tanya Sanchez , a 31 y.o. female  was evaluated in triage.  Pt complains of left side pain after fall 3 days ago. She states she tripped and fell to the cement and landed on her left hip/shoulder and has had pain since. Lidocaine topical and excedrin and muscle relaxer.   Review of Systems  Positive: Left shoulder neck and hip pain Negative: Fever   Physical Exam  BP (!) 135/95 (BP Location: Left Arm)    Pulse 75    Temp 98 F (36.7 C) (Oral)    Resp 20    Ht 5\' 2"  (1.575 m)    Wt 68 kg    SpO2 98%    BMI 27.42 kg/m  Gen:   Awake, no distress  Resp:  Normal effort  MSK:   Moves extremities without difficulty  Other:  Some left sided TTP of para-cervical musculature  Medical Decision Making  Medically screening exam initiated at 11:21 PM.  Appropriate orders placed.  Tanya Sanchez was informed that the remainder of the evaluation will be completed by another provider, this initial triage assessment does not replace that evaluation, and the importance of remaining in the ED until their evaluation is complete.  Seems like muscular pain   Tanya Sanchez, Tanya Sanchez 04/15/21 2325

## 2021-04-16 ENCOUNTER — Other Ambulatory Visit: Payer: Self-pay

## 2021-04-22 ENCOUNTER — Other Ambulatory Visit: Payer: Self-pay

## 2021-04-22 ENCOUNTER — Other Ambulatory Visit (HOSPITAL_COMMUNITY): Payer: Self-pay

## 2021-04-22 MED ORDER — CYCLOBENZAPRINE HCL 7.5 MG PO TABS
7.5000 mg | ORAL_TABLET | Freq: Two times a day (BID) | ORAL | 0 refills | Status: DC | PRN
  Filled 2021-04-22: qty 60, 30d supply, fill #0

## 2021-04-22 MED ORDER — TOPIRAMATE 25 MG PO TABS
25.0000 mg | ORAL_TABLET | Freq: Every day | ORAL | 3 refills | Status: DC
  Filled 2021-04-22: qty 30, 30d supply, fill #0
  Filled 2021-07-08: qty 30, 30d supply, fill #1

## 2021-04-23 ENCOUNTER — Other Ambulatory Visit: Payer: Self-pay

## 2021-04-25 ENCOUNTER — Other Ambulatory Visit: Payer: Self-pay

## 2021-04-25 ENCOUNTER — Other Ambulatory Visit (HOSPITAL_COMMUNITY): Payer: Self-pay

## 2021-04-25 MED ORDER — VITAMIN D (ERGOCALCIFEROL) 1.25 MG (50000 UNIT) PO CAPS
ORAL_CAPSULE | ORAL | 0 refills | Status: DC
  Filled 2021-04-25: qty 13, 91d supply, fill #0
  Filled 2021-04-30: qty 4, 38d supply, fill #0
  Filled 2021-05-28 – 2021-06-05 (×2): qty 4, 28d supply, fill #1

## 2021-04-28 ENCOUNTER — Other Ambulatory Visit (HOSPITAL_COMMUNITY): Payer: Self-pay

## 2021-04-30 ENCOUNTER — Other Ambulatory Visit: Payer: Self-pay

## 2021-05-01 ENCOUNTER — Other Ambulatory Visit (HOSPITAL_COMMUNITY): Payer: Self-pay

## 2021-05-15 ENCOUNTER — Encounter (HOSPITAL_COMMUNITY): Payer: Self-pay

## 2021-05-15 ENCOUNTER — Ambulatory Visit (HOSPITAL_COMMUNITY)
Admission: EM | Admit: 2021-05-15 | Discharge: 2021-05-15 | Disposition: A | Discharge status: Home / Self Care | Payer: Medicaid Other | Attending: Family Medicine | Admitting: Family Medicine

## 2021-05-15 ENCOUNTER — Other Ambulatory Visit: Payer: Self-pay

## 2021-05-15 DIAGNOSIS — R21 Rash and other nonspecific skin eruption: Secondary | ICD-10-CM

## 2021-05-15 DIAGNOSIS — R0789 Other chest pain: Secondary | ICD-10-CM

## 2021-05-15 MED ORDER — IBUPROFEN 800 MG PO TABS
800.0000 mg | ORAL_TABLET | Freq: Three times a day (TID) | ORAL | 0 refills | Status: DC | PRN
  Filled 2021-05-15: qty 21, 7d supply, fill #0

## 2021-05-15 MED ORDER — TRIAMCINOLONE ACETONIDE 40 MG/ML IJ SUSP
INTRAMUSCULAR | Status: AC
  Filled 2021-05-15: qty 1

## 2021-05-15 MED ORDER — TRIAMCINOLONE ACETONIDE 40 MG/ML IJ SUSP
40.0000 mg | Freq: Once | INTRAMUSCULAR | Status: AC
  Administered 2021-05-15: 40 mg via INTRAMUSCULAR

## 2021-05-15 NOTE — ED Provider Notes (Signed)
[facility]    CSN: [id number] Arrival date & time: 05/15/21  1543      History   Chief Complaint Chief Complaint  Patient presents with   Chest Pain   Back Pain    HPI Tanya Sanchez is a 31 y.o. female.    Chest Pain Associated symptoms: back pain   Back Pain Associated symptoms: chest pain   Here for some sharp central chest pain that began about 2 to 3 days ago now she is also having pain in both posterior shoulders.  No fever or cough or chills.  He is also had some rash that is coming and going in various places is maculopapular and fairly pruritic.    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      Home Medications    Prior to Admission medications   Medication Sig Start Date End Date Taking? Authorizing Provider  ibuprofen (ADVIL) 800 MG tablet Take 1 tablet (800 mg total) by mouth every 8 (eight) hours as needed (pain). 05/15/21  Yes Zenia Resides, MD  cyclobenzaprine (FEXMID) 7.5 MG tablet Take 1 tablet (7.5 mg total) by mouth 2 (two) times daily as needed. 04/22/21     topiramate (TOPAMAX) 25 MG tablet Take 1 tablet (25 mg total) by mouth at bedtime. 04/22/21      Vitamin D, Ergocalciferol, (DRISDOL) 1.25 MG (50000 UNIT) CAPS capsule Take by mouth every 7 (seven) days. 04/25/21       Family History Family History  Problem Relation Age of Onset   Heart disease Maternal Grandmother    Hypertension Maternal Grandmother    Depression Maternal Grandmother    Arthritis Maternal Grandmother    Depression Mother    Cancer Mother    Bladder Cancer Mother    Depression Sister     Social History Social History   Tobacco Use   Smoking status: Never   Smokeless tobacco: Never  Substance Use Topics   Alcohol use: No   Drug use: No     Allergies   Prozac [fluoxetine]   Review of Systems Review of Systems  Cardiovascular:  Positive for chest pain.  Musculoskeletal:  Positive for back pain.    Physical Exam Triage Vital Signs ED Triage Vitals  Enc Vitals Group     BP 05/15/21 1553 (!) 129/92     Pulse Rate 05/15/21 1553 93     Resp 05/15/21 1553 20     Temp 05/15/21 1553 98.1 F (36.7 C)     Temp Source 05/15/21 1553 Oral     SpO2 05/15/21 1553 97 %     Weight --      Height --  Head Circumference --      Peak Flow --      Pain Score 05/15/21 1552 9     Pain Loc --      Pain Edu? --      Excl. in GC? --    No data found.  Updated Vital Signs BP (!) 129/92 (BP Location: Left Arm)    Pulse 93    Temp 98.1 F (36.7 C) (Oral)    Resp 20    LMP  (LMP Unknown)    SpO2 97%   Visual Acuity Right Eye Distance:   Left Eye Distance:   Bilateral Distance:    Right Eye Near:   Left Eye Near:    Bilateral Near:     Physical Exam Vitals reviewed.  Constitutional:      General: She is not in acute distress.    Appearance: She is not ill-appearing, toxic-appearing or diaphoretic.  HENT:     Mouth/Throat:     Mouth: Mucous membranes are moist.     Pharynx: No oropharyngeal exudate or posterior oropharyngeal erythema.  Eyes:     Extraocular Movements: Extraocular movements intact.     Pupils: Pupils are equal, round, and  reactive to light.  Cardiovascular:     Rate and Rhythm: Normal rate and regular rhythm.     Heart sounds: No murmur heard. Pulmonary:     Effort: Pulmonary effort is normal.     Breath sounds: Normal breath sounds.  Chest:     Chest wall: Tenderness (She is very tender in her lower sternum and over her posterior trapezius bilaterally) present.  Musculoskeletal:     Cervical back: Neck supple.  Lymphadenopathy:     Cervical: No cervical adenopathy.  Skin:    Coloration: Skin is not jaundiced or pale.     Findings: Rash (There is a faint maculopapular pink rash on her upper arms today.  There is no vesicular or herpetiform rash seen) present.  Neurological:     Mental Status: She is oriented to person, place, and time.  Psychiatric:        Behavior: Behavior normal.     UC Treatments / Results  Labs (all labs ordered are listed, but only abnormal results are displayed) Labs Reviewed - No data to display  EKG   Radiology No results found.  Procedures Procedures (including critical care time)  Medications Ordered in UC Medications  triamcinolone acetonide (KENALOG-40) injection 40 mg (has no administration in time range)    Initial Impression / Assessment and Plan / UC Course  I have reviewed the triage vital signs and the nursing notes.  Pertinent labs & imaging results that were available during my care of the patient were reviewed by me and considered in my medical decision making (see chart for details).     We will treat with triamcinolone injection for dermatitis, and also for costochondritis.   Final Clinical Impressions(s) / UC Diagnoses   Final diagnoses:  Chest wall pain  Rash and nonspecific skin eruption     Discharge Instructions      You have been given a shot of triamcinolone 40 mg today for possible allergy  Take ibuprofen 800 mg 1 every 8 hours as needed for pain.  If you do still have meloxicam at home, do not take that when you are  taking the ibuprofen  You can use a heating pad on any sore areas.     ED Prescriptions     Medication Sig  Dispense Auth. Provider   ibuprofen (ADVIL) 800 MG tablet Take 1 tablet (800 mg total) by mouth every 8 (eight) hours as needed (pain). 21 tablet Lamond Glantz, Janace Aris, MD      I have reviewed the PDMP during this encounter.   Zenia Resides, MD 05/15/21 [phone]

## 2021-05-15 NOTE — Discharge Instructions (Addendum)
You have been given a shot of triamcinolone 40 mg today for possible allergy  Take ibuprofen 800 mg 1 every 8 hours as needed for pain.  If you do still have meloxicam at home, do not take that when you are taking the ibuprofen  You can use a heating pad on any sore areas.

## 2021-05-15 NOTE — ED Triage Notes (Signed)
Pt presents with central chest pain that radiates through to both shoulder blades X 3 days.

## 2021-05-16 ENCOUNTER — Other Ambulatory Visit: Payer: Self-pay

## 2021-05-28 ENCOUNTER — Other Ambulatory Visit: Payer: Self-pay

## 2021-06-04 ENCOUNTER — Other Ambulatory Visit: Payer: Self-pay

## 2021-06-05 ENCOUNTER — Other Ambulatory Visit: Payer: Self-pay

## 2021-06-05 MED ORDER — HYDROXYZINE HCL 25 MG PO TABS
ORAL_TABLET | ORAL | 1 refills | Status: DC
  Filled 2021-06-05: qty 60, 30d supply, fill #0

## 2021-06-05 MED ORDER — DULOXETINE HCL 30 MG PO CPEP
ORAL_CAPSULE | ORAL | 2 refills | Status: DC
  Filled 2021-06-05: qty 30, 30d supply, fill #0

## 2021-07-01 ENCOUNTER — Ambulatory Visit (HOSPITAL_COMMUNITY)
Admission: EM | Admit: 2021-07-01 | Discharge: 2021-07-01 | Disposition: A | Discharge status: Home / Self Care | Payer: Medicaid Other | Attending: Family Medicine | Admitting: Family Medicine

## 2021-07-01 ENCOUNTER — Encounter (HOSPITAL_COMMUNITY): Payer: Self-pay | Admitting: Emergency Medicine

## 2021-07-01 ENCOUNTER — Other Ambulatory Visit: Payer: Self-pay

## 2021-07-01 DIAGNOSIS — J029 Acute pharyngitis, unspecified: Secondary | ICD-10-CM | POA: Insufficient documentation

## 2021-07-01 LAB — POCT RAPID STREP A, ED / UC: Streptococcus, Group A Screen (Direct): NEGATIVE

## 2021-07-01 MED ORDER — IBUPROFEN 800 MG PO TABS: 800.0000 mg | ORAL_TABLET | Freq: Three times a day (TID) | ORAL | 0 refills | Status: DC | PRN

## 2021-07-01 NOTE — ED Triage Notes (Signed)
Pt reports losing voice 3 days ago and intermittent sore throat. States have been taking OTC theraflu.  ?

## 2021-07-01 NOTE — Discharge Instructions (Addendum)
Rapid strep negative; culture sent, and staff will call you if it's positive ? ?Dayquil or nyquil as needed.  ? ?Take ibuprofen 800 mg--1 tab every 8 hours as needed for pain.  ?

## 2021-07-01 NOTE — ED Provider Notes (Signed)
?[facility] ? ? ? ?CSN: [id number] ?Arrival date & time: 07/01/21  1609 ? ? ?  ? ?History   ?Chief Complaint ?Chief Complaint  ?Patient presents with  ? Sore Throat  ? Hoarse  ? ? ?HPI ?Tanya Sanchez is a 31 y.o. female.  ? ? ?Sore Throat ? ?Here for hoarseness and sore throat for 3 days. No f/c. Did have a little cough last night, not now. No n/v/d.  ? ?Past Medical History:  ?Diagnosis Date  ? ADHD (attention deficit hyperactivity disorder)   ? Arthritis   ? Asthma   ? Gallstones   ? Panic attack 2016  ? ? ?Patient Active Problem List  ? Diagnosis Date Noted  ? Lumbar herniated disc 08/28/2016  ? 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 Symptomatic cholelithiasis 10/14/2014  ? Epigastric abdominal pain 10/11/2014  ? Chronic tension headaches 10/11/2014  ? Vitamin D deficiency 07/31/2014  ? Panic attacks 07/30/2014  ? Poor dentition 07/30/2014  ? Allergic rhinitis 07/30/2014  ? ? ?Past Surgical History:  ?Procedure Laterality Date  ? CHOLECYSTECTOMY N/A 10/15/2014  ? Procedure: LAPAROSCOPIC CHOLECYSTECTOMY WITH INTRAOPERATIVE CHOLANGIOGRAM;  Surgeon: Jimmye Norman, MD;  Location: [facility] OR;  Service: General;  Laterality: N/A;  ? ? ?OB History   ?No obstetric history on file. ?  ? ? ? ?Home Medications   ? ?Prior to Admission medications   ?Medication Sig Start Date End Date Taking? Authorizing Provider  ?ibuprofen (ADVIL) 800 MG tablet Take 1 tablet (800 mg total) by mouth every 8 (eight) hours as needed (pain). 07/01/21  Yes Zenia Resides, MD  ?cyclobenzaprine (FEXMID) 7.5 MG tablet Take 1 tablet (7.5 mg total) by mouth 2 (two) times daily as needed. 04/22/21     ?DULoxetine (CYMBALTA) 30 MG capsule take 1 capsule (30 mg) by oral route once daily 06/05/21     ?hydrOXYzine (ATARAX) 25 MG tablet take 1 tablet (25 mg) by oral route 2 times per day as needed 06/05/21     ?topiramate (TOPAMAX) 25 MG tablet Take 1 tablet (25 mg total) by mouth at bedtime. 04/22/21     ?Vitamin D, Ergocalciferol,  (DRISDOL) 1.25 MG (50000 UNIT) CAPS capsule Take one capsule by mouth every 7 (seven) days. 04/25/21     ? ? ?Family History ?Family History  ?Problem Relation Age of Onset  ? Heart disease Maternal Grandmother   ? Hypertension Maternal Grandmother   ? Depression Maternal Grandmother   ? Arthritis Maternal Grandmother   ? Depression Mother   ? Cancer Mother   ? Bladder Cancer Mother   ? Depression Sister   ? ? ?Social History ?Social History  ? ?Tobacco Use  ? Smoking status: Never  ? Smokeless tobacco: Never  ?Substance Use Topics  ? Alcohol use: No  ? Drug use: No  ? ? ? ?Allergies   ?Prozac [fluoxetine] ? ? ?Review of Systems ?Review of Systems ? ? ?Physical Exam ?Triage Vital Signs ?ED Triage Vitals  ?Enc Vitals Group  ?   BP 07/01/21 1804 134/84  ?   Pulse Rate 07/01/21 1804 69  ?   Resp 07/01/21 1804 18  ?   Temp 07/01/21 1804 98 ?F (36.7 ?C)  ?   Temp Source 07/01/21 1804 Oral  ?   SpO2 07/01/21 1804 98 %  ?   Weight 07/01/21 1803 149 lb 14.6 oz (68 kg)  ?   Height 07/01/21 1803 5\' 2"  (1.575 m)  ?   Head Circumference --   ?  Peak Flow --   ?   Pain Score 07/01/21 1802 5  ?   Pain Loc --   ?   Pain Edu? --   ?   Excl. in GC? --   ? ?No data found. ? ?Updated Vital Signs ?BP 134/84 (BP Location: Left Arm)   Pulse 69   Temp 98 ?F (36.7 ?C) (Oral)   Resp 18   Ht 5\' 2"  (1.575 m)   Wt 68 kg   SpO2 98%   BMI 27.42 kg/m?  ? ?Visual Acuity ?Right Eye Distance:   ?Left Eye Distance:   ?Bilateral Distance:   ? ?Right Eye Near:   ?Left Eye Near:    ?Bilateral Near:    ? ?Physical Exam ?Vitals reviewed.  ?Constitutional:   ?   General: She is not in acute distress. ?   Appearance: She is not toxic-appearing.  ?HENT:  ?   Right Ear: Tympanic membrane and ear canal normal.  ?   Left Ear: Tympanic membrane and ear canal normal.  ?   Nose: Nose normal.  ?   Mouth/Throat:  ?   Mouth: Mucous membranes are moist.  ?   Comments: There is some erythema and clear mucus in the OP ?Eyes:  ?   Extraocular Movements: Extraocular  movements intact.  ?   Conjunctiva/sclera: Conjunctivae normal.  ?   Pupils: Pupils are equal, round, and reactive to light.  ?Cardiovascular:  ?   Rate and Rhythm: Normal rate and regular rhythm.  ?   Heart sounds: No murmur heard. ?Pulmonary:  ?   Effort: Pulmonary effort is normal. No respiratory distress.  ?   Breath sounds: No wheezing, rhonchi or rales.  ?Chest:  ?   Chest wall: No tenderness.  ?Musculoskeletal:  ?   Cervical back: Neck supple.  ?Lymphadenopathy:  ?   Cervical: No cervical adenopathy.  ?Skin: ?   Capillary Refill: Capillary refill takes less than 2 seconds.  ?   Coloration: Skin is not jaundiced or pale.  ?Neurological:  ?   General: No focal deficit present.  ?   Mental Status: She is alert and oriented to person, place, and time.  ?Psychiatric:     ?   Behavior: Behavior normal.  ? ? ? ?UC Treatments / Results  ?Labs ?(all labs ordered are listed, but only abnormal results are displayed) ?Labs Reviewed  ?CULTURE, GROUP A STREP [facility])  ?POCT RAPID STREP A, ED / UC  ? ? ?EKG ? ? ?Radiology ?No results found. ? ?Procedures ?Procedures (including critical care time) ? ?Medications Ordered in UC ?Medications - No data to display ? ?Initial Impression / Assessment and Plan / UC Course  ?I have reviewed the triage vital signs and the nursing notes. ? ?Pertinent labs & imaging results that were available during my care of the patient were reviewed by me and considered in my medical decision making (see chart for details). ? ?  ? ?Rapid strep is negative; culture is sent and we will tx per protocol if positive. ? ?She has done a home covid test that was negative, and declines further testing for that ?Final Clinical Impressions(s) / UC Diagnoses  ? ?Final diagnoses:  ?Pharyngitis, unspecified etiology  ? ? ? ?Discharge Instructions   ? ?  ?Rapid strep negative; culture sent, and staff will call you if it's positive ? ?Dayquil or nyquil as needed.  ? ?Take ibuprofen 800 mg--1 tab every 8 hours as  needed for pain.  ? ? ? ? ?  ED Prescriptions   ? ? Medication Sig Dispense Auth. Provider  ? ibuprofen (ADVIL) 800 MG tablet Take 1 tablet (800 mg total) by mouth every 8 (eight) hours as needed (pain). 21 tablet Marlinda MikeBanister, Janace ArisPamela K, MD  ? ?  ? ?PDMP not reviewed this encounter. ?  ?Zenia ResidesBanister, Finnick Orosz K, MD ?07/01/21 1841 ? ?

## 2021-07-04 LAB — CULTURE, GROUP A STREP (THRC)

## 2021-07-05 ENCOUNTER — Emergency Department (HOSPITAL_COMMUNITY)
Admission: EM | Admit: 2021-07-05 | Discharge: 2021-07-05 | Disposition: A | Discharge status: Home / Self Care | Payer: Medicaid Other | Attending: Emergency Medicine | Admitting: Emergency Medicine

## 2021-07-05 ENCOUNTER — Encounter (HOSPITAL_COMMUNITY): Payer: Self-pay

## 2021-07-05 ENCOUNTER — Other Ambulatory Visit: Payer: Self-pay

## 2021-07-05 DIAGNOSIS — R519 Headache, unspecified: Secondary | ICD-10-CM | POA: Insufficient documentation

## 2021-07-05 DIAGNOSIS — H538 Other visual disturbances: Secondary | ICD-10-CM | POA: Insufficient documentation

## 2021-07-05 MED ORDER — DIPHENHYDRAMINE HCL 50 MG/ML IJ SOLN
12.5000 mg | Freq: Once | INTRAMUSCULAR | Status: AC
  Administered 2021-07-05: 12.5 mg via INTRAVENOUS
  Filled 2021-07-05: qty 1

## 2021-07-05 MED ORDER — RIZATRIPTAN BENZOATE 10 MG PO TABS
10.0000 mg | ORAL_TABLET | ORAL | 0 refills | Status: DC | PRN
  Filled 2021-07-05: qty 10, 18d supply, fill #0

## 2021-07-05 MED ORDER — METOCLOPRAMIDE HCL 5 MG/ML IJ SOLN
5.0000 mg | Freq: Once | INTRAMUSCULAR | Status: AC
  Administered 2021-07-05: 5 mg via INTRAVENOUS
  Filled 2021-07-05: qty 2

## 2021-07-05 MED ORDER — DEXAMETHASONE SODIUM PHOSPHATE 4 MG/ML IJ SOLN
4.0000 mg | Freq: Once | INTRAMUSCULAR | Status: AC
  Administered 2021-07-05: 4 mg via INTRAVENOUS
  Filled 2021-07-05: qty 1

## 2021-07-05 MED ORDER — SODIUM CHLORIDE 0.9 % IV BOLUS
500.0000 mL | Freq: Once | INTRAVENOUS | Status: AC
  Administered 2021-07-05: 500 mL via INTRAVENOUS

## 2021-07-05 NOTE — Discharge Instructions (Addendum)

## 2021-07-05 NOTE — ED Triage Notes (Signed)
Pt presents to ED with c/o migraine that began yesterday evening. Pt endorses N/V and sensitivity to light.  ?

## 2021-07-05 NOTE — ED Provider Notes (Signed)
?[facility]-EMERGENCY DEPT ?Provider Note ? ? ?CSN: [id number] ?Arrival date & time: 07/05/21  1062 ? ?  ? ?History ? ?Chief Complaint  ?Patient presents with  ? Migraine  ? ? ?Tanya Sanchez is a 31 y.o. female with a pmh of frequent HAs and anxiety. Review of EMR shows previous dx of non intractable chronic tension type headaches. With associated throbbing, tightness, photophobia and phonophobia and occasionally blurred vision. Patient usually takes tylenol and Excedrin with moderate relief. Patient reports a "migraine type" HA today without fever, or neck stiffness. She took zofran and " a roll on medication" that did not help. She has had several episodes of vomiting which is what brought her into the ER.  ? ? ? ?Migraine ? ? ?  ? ?Home Medications ?Prior to Admission medications   ?Medication Sig Start Date End Date Taking? Authorizing Provider  ?cyclobenzaprine (FEXMID) 7.5 MG tablet Take 1 tablet (7.5 mg total) by mouth 2 (two) times daily as needed. 04/22/21     ?DULoxetine (CYMBALTA) 30 MG capsule take 1 capsule (30 mg) by oral route once daily 06/05/21     ?hydrOXYzine (ATARAX) 25 MG tablet take 1 tablet (25 mg) by oral route 2 times per day as needed 06/05/21     ?ibuprofen (ADVIL) 800 MG tablet Take 1 tablet (800 mg total) by mouth every 8 (eight) hours as needed (pain). 07/01/21   Zenia Resides, MD  ?topiramate (TOPAMAX) 25 MG tablet Take 1 tablet (25 mg total) by mouth at bedtime. 04/22/21     ?Vitamin D, Ergocalciferol, (DRISDOL) 1.25 MG (50000 UNIT) CAPS capsule Take one capsule by mouth every 7 (seven) days. 04/25/21     ?   ? ?Allergies    ?Prozac [fluoxetine]   ? ?Review of Systems   ?Review of Systems ? ?Physical Exam ?Updated Vital Signs ?BP 123/78   Pulse 63   Temp 98 ?F (36.7 ?C) (Oral)   Resp 16   SpO2 100%  ?Physical Exam ?Physical Exam  ?Constitutional: Pt is oriented to person, place, and time. Pt appears well-developed and well-nourished. No distress.  Sitting in  a darkened room. ?HENT:  ?Head: Normocephalic and atraumatic.  ?Mouth/Throat: Oropharynx is clear and moist.  ?Eyes: Conjunctivae and EOM are normal. Pupils are equal, round, and reactive to light. No scleral icterus.  ?No horizontal, vertical or rotational nystagmus  ?Neck: Normal range of motion. Neck supple.  ?Full active and passive ROM without pain ?No midline or paraspinal tenderness ?No nuchal rigidity or meningeal signs  ?Cardiovascular: Normal rate, regular rhythm and intact distal pulses.   ?Pulmonary/Chest: Effort normal and breath sounds normal. No respiratory distress. Pt has no wheezes. No rales.  ?Abdominal: Soft. Bowel sounds are normal. There is no tenderness. There is no rebound and no guarding.  ?Musculoskeletal: Normal range of motion.  ?Lymphadenopathy:  ?  No cervical adenopathy.  ?Neurological: Pt. is alert and oriented to person, place, and time. He has normal reflexes. No cranial nerve deficit.  Exhibits normal muscle tone. Coordination normal.  ?Mental Status:  ?Alert, oriented, thought content appropriate. Speech fluent without evidence of aphasia. Able to follow 2 step commands without difficulty.  ?Cranial Nerves:  ?II:  Peripheral visual fields grossly normal, pupils equal, round, reactive to light ?III,IV, VI: ptosis not present, extra-ocular motions intact bilaterally  ?V,VII: smile symmetric, facial light touch sensation equal ?VIII: hearing grossly normal bilaterally  ?IX,X: midline uvula rise  ?XI: bilateral shoulder shrug equal and strong ?XII: midline  tongue extension  ?Motor:  ?5/5 in upper and lower extremities bilaterally including strong and equal grip strength and dorsiflexion/plantar flexion ?Sensory: Pinprick and light touch normal in all extremities.  ?Deep Tendon Reflexes: 2+ and symmetric  ?Cerebellar: normal finger-to-nose with bilateral upper extremities ?Gait: normal gait and balance ?CV: distal pulses palpable throughout   ?Skin: Skin is warm and dry. No rash noted.  Pt is not diaphoretic.  ?Psychiatric: Pt has a normal mood and affect. Behavior is normal. Judgment and thought content normal.  ?Nursing note and vitals reviewed. ? ?ED Results / Procedures / Treatments   ?Labs ?(all labs ordered are listed, but only abnormal results are displayed) ?Labs Reviewed - No data to display ? ?EKG ?None ? ?Radiology ?No results found. ? ?Procedures ?Procedures  ? ? ?Medications Ordered in ED ?Medications - No data to display ? ?ED Course/ Medical Decision Making/ A&P ?Clinical Course as of 07/05/21 1140  ?Sat Jul 05, 2021  ?1138 Patient sleeping. She reports no headache at this time. [AH]  ?  ?Clinical Course User Index ?[AH] Arthor Captain, PA-C  ? ?                        ?Medical Decision Making ?Tanya Sanchez presents with headache ?Given the large differential diagnosis for [facility], the decision making in this case is of high complexity. ? ?After evaluating all of the data points in this case, the presentation of Tanya Sanchez is NOT consistent with skull fracture, meningitis/encephalitis, SAH/sentinel bleed, Intracranial Hemorrhage (ICH) (subdural/epidural), acute obstructive hydrocephalus, space occupying lesions, CVA, CO Poisoning, Basilar/vertebral artery dissection, preeclampsia, cerebral venous thrombosis, hypertensive emergency, temporal Arteritis, Idiopathic Intracranial Hypertension (pseudotumor cerebri). ? ?Strict return and follow-up precautions have been given by me personally or by detailed written instructions verbalized by nursing staff using the teach back method to patient/family/caregiver. ? ?Data Reviewed/Counseling: I have reviewed the patient's vital signs, nursing notes, and other relevant tests/information. I had a detailed discussion regarding the historical points, exam findings, and any diagnostic results supporting the discharge diagnosis. I also discussed the need for outpatient follow-up and the need to return to the ED if  symptoms worsen or if there are any questions or concerns that arise at hom ? ? ? ?Risk ?OTC drugs. ?Prescription drug management. ? ?Final Clinical Impression(s) / ED Diagnoses ?Final diagnoses:  ?None  ? ? ?Rx / DC Orders ?ED Discharge Orders   ? ? None  ? ?  ? ? ?  ?Arthor Captain, PA-C ?07/05/21 1141 ? ?  ?Wynetta Fines, MD ?07/05/21 1427 ? ?

## 2021-07-07 ENCOUNTER — Other Ambulatory Visit: Payer: Self-pay

## 2021-07-08 ENCOUNTER — Other Ambulatory Visit: Payer: Self-pay

## 2021-07-09 ENCOUNTER — Other Ambulatory Visit: Payer: Self-pay

## 2021-07-10 ENCOUNTER — Other Ambulatory Visit: Payer: Self-pay

## 2021-07-10 MED ORDER — DULOXETINE HCL 30 MG PO CPEP
ORAL_CAPSULE | ORAL | 11 refills | Status: DC
  Filled 2021-07-10: qty 30, 30d supply, fill #0

## 2021-07-10 MED ORDER — TOPIRAMATE 25 MG PO TABS: ORAL_TABLET | ORAL | 11 refills | Status: DC

## 2021-07-10 MED ORDER — ONDANSETRON HCL 4 MG PO TABS
ORAL_TABLET | ORAL | 1 refills | Status: DC
  Filled 2021-07-10: qty 90, 30d supply, fill #0
  Filled 2021-10-28: qty 90, 30d supply, fill #1

## 2021-07-11 ENCOUNTER — Other Ambulatory Visit: Payer: Self-pay

## 2021-08-14 ENCOUNTER — Other Ambulatory Visit: Payer: Self-pay

## 2021-08-14 MED ORDER — IBUPROFEN 800 MG PO TABS
ORAL_TABLET | ORAL | 2 refills | Status: DC
  Filled 2021-08-14: qty 120, 40d supply, fill #0

## 2021-08-21 ENCOUNTER — Other Ambulatory Visit: Payer: Self-pay

## 2021-09-06 ENCOUNTER — Ambulatory Visit (INDEPENDENT_AMBULATORY_CARE_PROVIDER_SITE_OTHER): Payer: Self-pay

## 2021-09-06 ENCOUNTER — Ambulatory Visit (HOSPITAL_COMMUNITY)
Admission: EM | Admit: 2021-09-06 | Discharge: 2021-09-06 | Disposition: A | Discharge status: Home / Self Care | Payer: Self-pay | Attending: Physician Assistant | Admitting: Physician Assistant

## 2021-09-06 ENCOUNTER — Encounter (HOSPITAL_COMMUNITY): Payer: Self-pay | Admitting: *Deleted

## 2021-09-06 ENCOUNTER — Other Ambulatory Visit: Payer: Self-pay

## 2021-09-06 DIAGNOSIS — R52 Pain, unspecified: Secondary | ICD-10-CM | POA: Insufficient documentation

## 2021-09-06 DIAGNOSIS — R059 Cough, unspecified: Secondary | ICD-10-CM

## 2021-09-06 DIAGNOSIS — R5383 Other fatigue: Secondary | ICD-10-CM

## 2021-09-06 DIAGNOSIS — U071 COVID-19: Secondary | ICD-10-CM | POA: Insufficient documentation

## 2021-09-06 DIAGNOSIS — R051 Acute cough: Secondary | ICD-10-CM | POA: Insufficient documentation

## 2021-09-06 LAB — SARS CORONAVIRUS 2 (TAT 6-24 HRS): SARS Coronavirus 2: POSITIVE — AB

## 2021-09-06 MED ORDER — BENZONATATE 100 MG PO CAPS: 100.0000 mg | ORAL_CAPSULE | Freq: Three times a day (TID) | ORAL | 0 refills | Status: AC | PRN

## 2021-09-06 MED ORDER — AZELASTINE HCL 0.1 % NA SOLN
1.0000 | Freq: Two times a day (BID) | NASAL | 0 refills | Status: DC
Start: 2021-09-06 — End: 2023-01-28

## 2021-09-06 NOTE — Discharge Instructions (Addendum)
Your Chest XRAY was negative today. You will be contacted if your COVID test is positive. Take medications as directed. Rest, push fluids.

## 2021-09-06 NOTE — ED Triage Notes (Signed)
Pt reports a cough for 5 days. Pt also has generalized muscle pain .

## 2021-09-06 NOTE — ED Provider Notes (Signed)
[facility]   MRN: [id number] DOB: 1990-04-03  Subjective:   Tanya Sanchez is a 31 y.o. female presenting for the following:  Chief complaint: Cough and body aches Symptom onset: 5 days ago started with cough, 2 days ago started with allover body aches and fatigue Pertinent positives: Fatigue, lack of appetite, sore throat, nasal congestion, headache Pertinent negatives: Shortness of breath, chest pain, nausea, vomiting, or diarrhea, no abdominal pain; unsure about fever as she has not been checking her temperature at home; no dysuria Treatments tried: Alka-Seltzer plus, Excedrin Migraine Sick exposure: No household sick contacts   No current facility-administered medications for this encounter.  Current Outpatient Medications:    azelastine (ASTELIN) 0.1 % nasal spray, Place 1 spray into both nostrils 2 (two) times daily. Use in each nostril as directed, Disp: 30 mL, Rfl: 0   benzonatate (TESSALON) 100 MG capsule, Take 1 capsule (100 mg total) by mouth 3 (three) times daily as needed for up to 20 days for cough., Disp: 30 capsule, Rfl: 0   cyclobenzaprine (FEXMID) 7.5 MG tablet, Take 1 tablet (7.5 mg total) by mouth 2 (two) times daily as needed., Disp: 60 tablet, Rfl: 0   DULoxetine (CYMBALTA) 30 MG capsule, take 1 capsule (30 mg) by oral route once daily, Disp: 30 capsule, Rfl: 2   DULoxetine (CYMBALTA) 30 MG capsule, take 1 capsule (30 mg) by oral route once daily, Disp: 30 capsule, Rfl: 11   hydrOXYzine (ATARAX) 25 MG tablet, take 1 tablet (25 mg) by oral route 2 times per day as needed, Disp: 60 tablet, Rfl: 1   ibuprofen (ADVIL) 800 MG tablet, Take 1 tablet (800 mg total) by mouth every 8 (eight) hours as needed (pain)., Disp: 21 tablet, Rfl: 0   ibuprofen (ADVIL) 800 MG tablet, Take 1 Tablet by mouth 3 (three) times daily as needed for pain, Disp: 120 tablet, Rfl: 2   ondansetron (ZOFRAN) 4 MG tablet, Take 1 tablet (4 mg) by oral route 3 times per day as  needed, Disp: 90 tablet, Rfl: 1   rizatriptan (MAXALT) 10 MG tablet, Take 1 tablet (10 mg total) by mouth as needed for migraine. May repeat in 2 hours if needed. Max of 2 tablets in one day and max of 4 tablets in one week., Disp: 10 tablet, Rfl: 0   topiramate (TOPAMAX) 25 MG tablet, Take 1 tablet (25 mg total) by mouth at bedtime., Disp: 30 tablet, Rfl: 3   topiramate (TOPAMAX) 25 MG tablet, Take 1 tablet (25 mg) by oral route once daily at bedtime, Disp: 30 tablet, Rfl: 11   Vitamin D, Ergocalciferol, (DRISDOL) 1.25 MG (50000 UNIT) CAPS capsule, Take one capsule by mouth every 7 (seven) days., Disp: 13 capsule, Rfl: 0   Allergies  Allergen Reactions   Prozac [Fluoxetine]     Past Medical History:  Diagnosis Date   ADHD (attention deficit hyperactivity disorder)    Arthritis    Asthma    Gallstones    Panic attack 2016     Past Surgical History:  Procedure Laterality Date   CHOLECYSTECTOMY N/A 10/15/2014   Procedure: LAPAROSCOPIC CHOLECYSTECTOMY WITH INTRAOPERATIVE CHOLANGIOGRAM;  Surgeon: Jimmye Norman, MD;  Location: [facility] OR;  Service: General;  Laterality: N/A;    Family History  Problem Relation Age of Onset   Heart disease Maternal Grandmother    Hypertension Maternal Grandmother    Depression Maternal Grandmother    Arthritis Maternal Grandmother    Depression Mother  Cancer Mother    Bladder Cancer Mother    Depression Sister     Social History   Tobacco Use   Smoking status: Never   Smokeless tobacco: Never  Substance Use Topics   Alcohol use: No   Drug use: No    ROS REFER TO HPI FOR PERTINENT POSITIVES AND NEGATIVES   Objective:   Vitals: BP 117/81   Pulse 86   Temp 97.8 F (36.6 C)   Resp 20   LMP 09/06/2021   SpO2 100%   Physical Exam Vitals and nursing note reviewed.  Constitutional:      General: She is not in acute distress.    Appearance: Normal appearance. She is not ill-appearing.  HENT:     Head: Normocephalic.     Right Ear:  Tympanic membrane and external ear normal. There is impacted cerumen.     Left Ear: Tympanic membrane and external ear normal. There is impacted cerumen.     Nose: Congestion present.     Mouth/Throat:     Mouth: Mucous membranes are moist.     Pharynx: No oropharyngeal exudate or posterior oropharyngeal erythema.  Eyes:     Extraocular Movements: Extraocular movements intact.     Conjunctiva/sclera: Conjunctivae normal.     Pupils: Pupils are equal, round, and reactive to light.  Cardiovascular:     Rate and Rhythm: Normal rate and regular rhythm.     Pulses: Normal pulses.     Heart sounds: Normal heart sounds. No murmur heard. Pulmonary:     Effort: Pulmonary effort is normal. No respiratory distress.     Breath sounds: Normal breath sounds. No wheezing.     Comments: Persistent dry paroxysmal coughing  Abdominal:     General: Abdomen is flat. Bowel sounds are normal.     Palpations: Abdomen is soft.     Tenderness: There is no abdominal tenderness.  Musculoskeletal:     Cervical back: Normal range of motion.  Skin:    General: Skin is warm.  Neurological:     Mental Status: She is alert and oriented to person, place, and time.  Psychiatric:        Mood and Affect: Mood normal.        Behavior: Behavior normal.     No results found for this or any previous visit (from the past 24 hour(s)).  Assessment and Plan :   PDMP not reviewed this encounter.  1. Acute cough   2. Generalized body aches    No red flags noted on exam today.  Patient not in acute respiratory distress.  Because of her persistent cough and body aches, agreed to check a chest x-ray, which I personally reviewed and also reviewed radiology interpretation, which was negative for any acute findings including infiltrates.  A send out COVID-19 test was performed and she will be contacted if results are positive.  Plan to treat conservatively at this time with Tessalon Perles, Astelin nasal spray, fluids,  Tylenol and ibuprofen as needed.  A work note was provided for patient.  She will follow-up with PCP if worsening or no improvement in the coming week.    Daisja Kessinger, PA-C      [place] Gamero, Crist Infante, PA-C 09/06/21 1619

## 2021-09-25 ENCOUNTER — Other Ambulatory Visit: Payer: Self-pay

## 2021-09-25 MED ORDER — NAPROXEN 250 MG PO TABS
ORAL_TABLET | ORAL | 1 refills | Status: DC
  Filled 2021-09-25: qty 60, 30d supply, fill #0

## 2021-10-28 ENCOUNTER — Other Ambulatory Visit: Payer: Self-pay

## 2021-10-31 ENCOUNTER — Other Ambulatory Visit: Payer: Self-pay

## 2022-01-18 ENCOUNTER — Ambulatory Visit (INDEPENDENT_AMBULATORY_CARE_PROVIDER_SITE_OTHER): Payer: Medicaid Other

## 2022-01-18 ENCOUNTER — Encounter (HOSPITAL_COMMUNITY): Payer: Self-pay | Admitting: Emergency Medicine

## 2022-01-18 ENCOUNTER — Ambulatory Visit (HOSPITAL_COMMUNITY)
Admission: EM | Admit: 2022-01-18 | Discharge: 2022-01-18 | Disposition: A | Discharge status: Home / Self Care | Payer: Medicaid Other | Attending: Family Medicine | Admitting: Family Medicine

## 2022-01-18 DIAGNOSIS — M25551 Pain in right hip: Secondary | ICD-10-CM

## 2022-01-18 DIAGNOSIS — M25531 Pain in right wrist: Secondary | ICD-10-CM

## 2022-01-18 MED ORDER — NAPROXEN 500 MG PO TABS: 500.0000 mg | ORAL_TABLET | Freq: Two times a day (BID) | ORAL | 0 refills | Status: DC | PRN

## 2022-01-18 MED ORDER — KETOROLAC TROMETHAMINE 30 MG/ML IJ SOLN
30.0000 mg | Freq: Once | INTRAMUSCULAR | Status: AC
  Administered 2022-01-18: 30 mg via INTRAMUSCULAR

## 2022-01-18 MED ORDER — KETOROLAC TROMETHAMINE 30 MG/ML IJ SOLN
INTRAMUSCULAR | Status: AC
  Filled 2022-01-18: qty 1

## 2022-01-18 NOTE — ED Provider Notes (Signed)
[place]    CSN: [id number] Arrival date & time: 01/18/22  1608      History   Chief Complaint Chief Complaint  Patient presents with   Hand Pain   Hip Pain    HPI Tanya Sanchez is a 31 y.o. female.    Hand Pain  Hip Pain   Here for right lateral hip pain its been bothering her about a month.  No trauma.  It can radiate down toward her knee.     She also has had some right wrist pain.  And now that is radiating toward her elbow.  She also notes a lot of paresthesia and tingling.  She already has a brace for carpal tunnel   No fever no rash. Last menstrual cycle was 1 week ago.  She states she absolutely could not be pregnant    Past Medical History:  Diagnosis Date   ADHD (attention deficit hyperactivity disorder)    Arthritis    Asthma    Gallstones    Panic attack 2016    Patient Active Problem List   Diagnosis Date Noted   Lumbar herniated disc 08/28/2016   Facet hypertrophy of lumbar region 08/28/2016   Symptomatic cholelithiasis 10/14/2014   Epigastric abdominal pain 10/11/2014   Chronic tension headaches 10/11/2014   Vitamin D deficiency 07/31/2014   Panic attacks 07/30/2014   Poor dentition 07/30/2014   Allergic rhinitis 07/30/2014    Past Surgical History:  Procedure Laterality Date   CHOLECYSTECTOMY N/A 10/15/2014   Procedure: LAPAROSCOPIC CHOLECYSTECTOMY WITH INTRAOPERATIVE CHOLANGIOGRAM;  Surgeon: Judeth Horn, MD;  Location: [place];  Service: General;  Laterality: N/A;    OB History   No obstetric history on file.      Home Medications    Prior to Admission medications   Medication Sig Start Date End Date Taking? Authorizing Provider  naproxen (NAPROSYN) 500 MG tablet Take 1 tablet (500 mg total) by mouth 2 (two) times daily as needed (pain). 01/18/22  Yes Barrett Henle, MD  azelastine (ASTELIN) 0.1 % nasal spray Place 1 spray into both nostrils 2 (two) times daily. Use in each nostril as directed 09/06/21    Allwardt, Alyssa M, PA-C  cyclobenzaprine (FEXMID) 7.5 MG tablet Take 1 tablet (7.5 mg total) by mouth 2 (two) times daily as needed. 04/22/21     DULoxetine (CYMBALTA) 30 MG capsule take 1 capsule (30 mg) by oral route once daily 06/05/21     DULoxetine (CYMBALTA) 30 MG capsule take 1 capsule (30 mg) by oral route once daily 07/10/21     hydrOXYzine (ATARAX) 25 MG tablet take 1 tablet (25 mg) by oral route 2 times per day as needed 06/05/21     rizatriptan (MAXALT) 10 MG tablet Take 1 tablet (10 mg total) by mouth as needed for migraine. May repeat in 2 hours if needed. Max of 2 tablets in one day and max of 4 tablets in one week. 07/05/21   Margarita Mail, PA-C  topiramate (TOPAMAX) 25 MG tablet Take 1 tablet (25 mg total) by mouth at bedtime. 04/22/21     topiramate (TOPAMAX) 25 MG tablet Take 1 tablet (25 mg) by oral route once daily at bedtime 07/10/21     Vitamin D, Ergocalciferol, (DRISDOL) 1.25 MG (50000 UNIT) CAPS capsule Take one capsule by mouth every 7 (seven) days. 04/25/21       Family History Family History  Problem Relation Age of Onset   Heart disease Maternal Grandmother  Hypertension Maternal Grandmother    Depression Maternal Grandmother    Arthritis Maternal Grandmother    Depression Mother    Cancer Mother    Bladder Cancer Mother    Depression Sister     Social History Social History   Tobacco Use   Smoking status: Never   Smokeless tobacco: Never  Substance Use Topics   Alcohol use: No   Drug use: No     Allergies   Prozac [fluoxetine]   Review of Systems Review of Systems   Physical Exam Triage Vital Signs ED Triage Vitals  Enc Vitals Group     BP 01/18/22 1717 129/85     Pulse Rate 01/18/22 1717 91     Resp 01/18/22 1717 18     Temp 01/18/22 1717 97.6 F (36.4 C)     Temp Source 01/18/22 1717 Oral     SpO2 01/18/22 1717 98 %     Weight --      Height --      Head Circumference --      Peak Flow --      Pain Score 01/18/22 1718 10      Pain Loc --      Pain Edu? --      Excl. in GC? --    No data found.  Updated Vital Signs BP 129/85 (BP Location: Right Arm)   Pulse 91   Temp 97.6 F (36.4 C) (Oral)   Resp 18   SpO2 98%   Visual Acuity Right Eye Distance:   Left Eye Distance:   Bilateral Distance:    Right Eye Near:   Left Eye Near:    Bilateral Near:     Physical Exam Vitals reviewed.  Constitutional:      General: She is not in acute distress.    Appearance: She is normal weight. She is not ill-appearing, toxic-appearing or diaphoretic.  Musculoskeletal:     Comments: There is maybe a little puffiness of the dorsum of the right hand in the middle.  There is no erythema or warmth.  There is tenderness of the right wrist joint and of the right iliac crest.  Skin:    Coloration: Skin is not pale.     Findings: No rash.  Neurological:     Mental Status: She is alert and oriented to person, place, and time.  Psychiatric:        Behavior: Behavior normal.      UC Treatments / Results  Labs (all labs ordered are listed, but only abnormal results are displayed) Labs Reviewed - No data to display  EKG   Radiology DG Wrist Complete Right  Result Date: 01/18/2022 CLINICAL DATA:  Wrist and hand pain EXAM: RIGHT WRIST - COMPLETE 3+ VIEW COMPARISON:  None Available. FINDINGS: There is no evidence of fracture or dislocation. There is no evidence of arthropathy or other focal bone abnormality. Soft tissues are unremarkable. IMPRESSION: Negative. Electronically Signed   By: Romona Curls M.D.   On: 01/18/2022 18:16   DG Hip Unilat With Pelvis 2-3 Views Right  Result Date: 01/18/2022 CLINICAL DATA:  Right hip pain. EXAM: DG HIP (WITH OR WITHOUT PELVIS) 2-3V RIGHT COMPARISON:  None Available. FINDINGS: There is no evidence of hip fracture or dislocation. There is no evidence of arthropathy or other focal bone abnormality. IMPRESSION: Negative. Electronically Signed   By: Darliss Cheney M.D.   On: 01/18/2022  18:16    Procedures Procedures (including critical care time)  Medications  Ordered in UC Medications  ketorolac (TORADOL) 30 MG/ML injection 30 mg (has no administration in time range)    Initial Impression / Assessment and Plan / UC Course  I have reviewed the triage vital signs and the nursing notes.  Pertinent labs & imaging results that were available during my care of the patient were reviewed by me and considered in my medical decision making (see chart for details).        X-rays of the hip and wrist are benign.  Toradol shot is given in naproxen 500 g is sent for pain relief.  She states that she is going to make an appointment with her primary care.  She is also given contact information for hand/orthopedics Final Clinical Impressions(s) / UC Diagnoses   Final diagnoses:  Right hip pain  Right wrist pain     Discharge Instructions      X-rays did not show any acute bony problem of your hip or wrist.  Keep wearing your wrist brace for now.  You have been given a shot of Toradol 30 mg today.  Take naproxen 500 mg--1 tablet every 12 hours as needed for pain  See your primary care about this issue     ED Prescriptions     Medication Sig Dispense Auth. Provider   naproxen (NAPROSYN) 500 MG tablet Take 1 tablet (500 mg total) by mouth 2 (two) times daily as needed (pain). 30 tablet Ngozi Alvidrez, Janace Aris, MD      I have reviewed the PDMP during this encounter.   Zenia Resides, MD 01/18/22 [phone]

## 2022-01-18 NOTE — ED Triage Notes (Signed)
Pt reports right hip pain x 1 month. States pain either shoots up into ribs, lower back or down to right knee. Has been taking Tylenol and Ibuprofen with no relief.  Also endorses right hand pain. States she sprained her thumb in her sleep awhile ago and last night she had severe pain in her right hand. States pain shoots sharp pains to forearm and shoulder. Reports the hand pain is causing her carpal tunnel to get worse.

## 2022-01-18 NOTE — Discharge Instructions (Signed)
X-rays did not show any acute bony problem of your hip or wrist.  Keep wearing your wrist brace for now.  You have been given a shot of Toradol 30 mg today.  Take naproxen 500 mg--1 tablet every 12 hours as needed for pain  See your primary care about this issue

## 2022-04-03 ENCOUNTER — Ambulatory Visit (HOSPITAL_COMMUNITY)
Admission: EM | Admit: 2022-04-03 | Discharge: 2022-04-03 | Disposition: A | Discharge status: Home / Self Care | Payer: Self-pay | Attending: Physician Assistant | Admitting: Physician Assistant

## 2022-04-03 ENCOUNTER — Encounter (HOSPITAL_COMMUNITY): Payer: Self-pay

## 2022-04-03 DIAGNOSIS — J40 Bronchitis, not specified as acute or chronic: Secondary | ICD-10-CM

## 2022-04-03 DIAGNOSIS — G8929 Other chronic pain: Secondary | ICD-10-CM

## 2022-04-03 DIAGNOSIS — Z8709 Personal history of other diseases of the respiratory system: Secondary | ICD-10-CM

## 2022-04-03 DIAGNOSIS — J329 Chronic sinusitis, unspecified: Secondary | ICD-10-CM

## 2022-04-03 DIAGNOSIS — R030 Elevated blood-pressure reading, without diagnosis of hypertension: Secondary | ICD-10-CM

## 2022-04-03 DIAGNOSIS — M25511 Pain in right shoulder: Secondary | ICD-10-CM

## 2022-04-03 MED ORDER — AMOXICILLIN-POT CLAVULANATE 875-125 MG PO TABS: 1.0000 | ORAL_TABLET | Freq: Two times a day (BID) | ORAL | 0 refills | Status: DC

## 2022-04-03 MED ORDER — ALBUTEROL SULFATE HFA 108 (90 BASE) MCG/ACT IN AERS: 1.0000 | INHALATION_SPRAY | Freq: Four times a day (QID) | RESPIRATORY_TRACT | 0 refills | Status: AC | PRN

## 2022-04-03 NOTE — Discharge Instructions (Addendum)
Start Augmentin twice daily to cover for infection.  Use over-the-counter medications including Mucinex and Flonase.  Use albuterol every 4-6 hours as needed.  Make sure you rest and drink plenty of fluid.  If your symptoms are improving within a week please return for reevaluation.  If anything worsens you need to be seen immediately.  Continue with your prescribed medications to help with your shoulder pain.  Follow-up with sports medicine; call to schedule an appointment.  If anything worsens return for reevaluation.  Your blood pressure is elevated.  I suspect this is because you are sick.  Please monitor this at home.  If it remains above 140/90 should be reevaluated.  If you develop any chest pain, shortness of breath, headache, vision change, dizziness in the setting of high blood pressure you need to go to the emergency room.

## 2022-04-03 NOTE — ED Triage Notes (Signed)
Pt presents to [facility] for cough and congestin x 2 weeks.  Pt reports left side shoulder pain.

## 2022-04-03 NOTE — ED Provider Notes (Signed)
[facility]    CSN: [id number] Arrival date & time: 04/03/22  1832      History   Chief Complaint Chief Complaint  Patient presents with   Cough   Nasal Congestion   Shoulder Pain    HPI Tanya Sanchez is a 32 y.o. female.   Patient presents today with a several week history of URI symptoms including cough and congestion.  Reports associated fatigue and malaise.  Denies any fever, chest pain, shortness of breath, nausea, vomiting.  She has tried multiple over-the-counter medications including Mucinex, cold and cough, Excedrin, shower tablets without improvement of symptoms.  Denies any known sick contacts.  Denies any recent antibiotics or steroids.  Reports a history of asthma when she was younger but has not required albuterol inhaler more recently.  Denies history of smoking or COPD.  She is confident that she is not pregnant.  In addition, she reports a prolonged history of intermittent right shoulder pain.  Reports occasionally this gets locked and feels stiff.  She has been taking Excedrin without improvement of symptoms.  She has follow-up scheduled with her primary care.  It is interfering with her ability to perform daily activities.  Blood pressure is elevated.  She denies any history of elevated blood pressure does not take any antihypertensive medication.  Denies any chest pain, shortness of breath, headache, vision change, dizziness.  She has been taking NSAIDs as well as some decongestants which could be elevating her blood pressure.    Past Medical History:  Diagnosis Date   ADHD (attention deficit hyperactivity disorder)    Arthritis    Asthma    Gallstones    Panic attack 2016    Patient Active Problem List   Diagnosis Date Noted   Lumbar herniated disc 08/28/2016   Facet hypertrophy of lumbar region 08/28/2016   Symptomatic cholelithiasis 10/14/2014   Epigastric abdominal pain 10/11/2014   Chronic tension headaches 10/11/2014   Vitamin D  deficiency 07/31/2014   Panic attacks 07/30/2014   Poor dentition 07/30/2014   Allergic rhinitis 07/30/2014    Past Surgical History:  Procedure Laterality Date   CHOLECYSTECTOMY N/A 10/15/2014   Procedure: LAPAROSCOPIC CHOLECYSTECTOMY WITH INTRAOPERATIVE CHOLANGIOGRAM;  Surgeon: Jimmye Norman, MD;  Location: [facility] OR;  Service: General;  Laterality: N/A;    OB History   No obstetric history on file.      Home Medications    Prior to Admission medications   Medication Sig Start Date End Date Taking? Authorizing Provider  albuterol (VENTOLIN HFA) 108 (90 Base) MCG/ACT inhaler Inhale 1-2 puffs into the lungs every 6 (six) hours as needed for wheezing or shortness of breath. 04/03/22  Yes Daijha Leggio K, PA-C  amoxicillin-clavulanate (AUGMENTIN) 875-125 MG tablet Take 1 tablet by mouth every 12 (twelve) hours. 04/03/22  Yes Harumi Yamin K, PA-C  azelastine (ASTELIN) 0.1 % nasal spray Place 1 spray into both nostrils 2 (two) times daily. Use in each nostril as directed 09/06/21   Allwardt, Alyssa M, PA-C  cyclobenzaprine (FEXMID) 7.5 MG tablet Take 1 tablet (7.5 mg total) by mouth 2 (two) times daily as needed. 04/22/21     DULoxetine (CYMBALTA) 30 MG capsule take 1 capsule (30 mg) by oral route once daily 07/10/21     hydrOXYzine (ATARAX) 25 MG tablet take 1 tablet (25 mg) by oral route 2 times per day as needed 06/05/21     naproxen (NAPROSYN) 500 MG tablet Take 1 tablet (500 mg total) by mouth 2 (two) times  daily as needed (pain). 01/18/22   Barrett Henle, MD  rizatriptan (MAXALT) 10 MG tablet Take 1 tablet (10 mg total) by mouth as needed for migraine. May repeat in 2 hours if needed. Max of 2 tablets in one day and max of 4 tablets in one week. 07/05/21   Margarita Mail, PA-C  topiramate (TOPAMAX) 25 MG tablet Take 1 tablet (25 mg total) by mouth at bedtime. 04/22/21     topiramate (TOPAMAX) 25 MG tablet Take 1 tablet (25 mg) by oral route once daily at bedtime 07/10/21     Vitamin D,  Ergocalciferol, (DRISDOL) 1.25 MG (50000 UNIT) CAPS capsule Take one capsule by mouth every 7 (seven) days. 04/25/21       Family History Family History  Problem Relation Age of Onset   Heart disease Maternal Grandmother    Hypertension Maternal Grandmother    Depression Maternal Grandmother    Arthritis Maternal Grandmother    Depression Mother    Cancer Mother    Bladder Cancer Mother    Depression Sister     Social History Social History   Tobacco Use   Smoking status: Never   Smokeless tobacco: Never  Substance Use Topics   Alcohol use: No   Drug use: No     Allergies   Prozac [fluoxetine]   Review of Systems Review of Systems  Constitutional:  Positive for activity change. Negative for appetite change, fatigue and fever.  HENT:  Positive for congestion, sinus pressure and sore throat. Negative for sneezing.   Respiratory:  Positive for cough. Negative for shortness of breath.   Cardiovascular:  Negative for chest pain.  Gastrointestinal:  Negative for abdominal pain, diarrhea, nausea and vomiting.  Musculoskeletal:  Positive for arthralgias. Negative for myalgias.  Neurological:  Negative for dizziness, light-headedness and headaches.     Physical Exam Triage Vital Signs ED Triage Vitals [04/03/22 1915]  Enc Vitals Group     BP (!) 165/114     Pulse Rate 80     Resp 18     Temp      Temp Source Oral     SpO2 100 %     Weight      Height      Head Circumference      Peak Flow      Pain Score      Pain Loc      Pain Edu?      Excl. in [place]?    No data found.  Updated Vital Signs BP (!) 148/100 (BP Location: Left Arm)   Pulse 80   Temp 98.1 F (36.7 C)   Resp 18   SpO2 100%   Visual Acuity Right Eye Distance:   Left Eye Distance:   Bilateral Distance:    Right Eye Near:   Left Eye Near:    Bilateral Near:     Physical Exam Vitals reviewed.  Constitutional:      General: She is awake. She is not in acute distress.    Appearance:  Normal appearance. She is well-developed. She is not ill-appearing.     Comments: Very pleasant female appears stated age in no acute distress sitting comfortably in exam room  HENT:     Head: Normocephalic and atraumatic.     Right Ear: External ear normal. There is impacted cerumen.     Left Ear: External ear normal. There is impacted cerumen.     Nose:     Right Sinus: Maxillary sinus  tenderness present. No frontal sinus tenderness.     Left Sinus: Maxillary sinus tenderness present. No frontal sinus tenderness.     Mouth/Throat:     Pharynx: Uvula midline. Posterior oropharyngeal erythema present. No oropharyngeal exudate.     Comments: Erythema and drainage present posterior oropharynx Cardiovascular:     Rate and Rhythm: Normal rate and regular rhythm.     Heart sounds: Normal heart sounds, S1 normal and S2 normal. No murmur heard. Pulmonary:     Effort: Pulmonary effort is normal.     Breath sounds: Normal breath sounds. No wheezing, rhonchi or rales.     Comments: Clear to auscultation bilaterally Musculoskeletal:     Right shoulder: Tenderness present. No swelling or bony tenderness. Normal range of motion.  Psychiatric:        Behavior: Behavior is cooperative.      UC Treatments / Results  Labs (all labs ordered are listed, but only abnormal results are displayed) Labs Reviewed - No data to display  EKG   Radiology No results found.  Procedures Procedures (including critical care time)  Medications Ordered in UC Medications - No data to display  Initial Impression / Assessment and Plan / UC Course  I have reviewed the triage vital signs and the nursing notes.  Pertinent labs & imaging results that were available during my care of the patient were reviewed by me and considered in my medical decision making (see chart for details).     Patient is well-appearing, afebrile, nontoxic, nontachycardic.  No indication for viral testing as she has been symptomatic  for several weeks.  Given prolonged and worsening symptoms will cover for secondary bacterial infection with Augmentin twice daily for 7 days.  She was encouraged to use over-the-counter medication including Mucinex, Flonase, Tylenol.  She was provided a refill of albuterol to be used as needed every 4-6 hours.  Discussed that if she is having to use this medication regularly she should return for reevaluation.  If her symptoms are improving within a week or if anything worsens she is to return for reevaluation.  Recommend close follow-up with primary care.  Strict return precautions given.  Patient has already tried multiple medications to help manage her shoulder pain.  Recommended that she follow-up with sports medicine was given contact information for local provider with instruction to call to schedule an appointment.  Discussed that if she has any worsening or changing symptoms she needs to be reevaluated.  Strict return precautions given.  Blood pressure is elevated but this improved on recheck.  Discussed that if she has any chest pain, shortness of breath, headache, vision change, dizziness in setting of high blood pressure she be reevaluated.  She is to monitor her blood pressure at home and if this is persistently above 140/90 after she is feeling better and has discontinued over-the-counter medications that could be temporarily elevating her blood pressure she should see [place] or her primary care to consider medication.  Strict return precautions given to which she expressed understanding.  Final Clinical Impressions(s) / UC Diagnoses   Final diagnoses:  Sinobronchitis  History of asthma  Elevated blood-pressure reading, without diagnosis of hypertension  Chronic right shoulder pain     Discharge Instructions      Start Augmentin twice daily to cover for infection.  Use over-the-counter medications including Mucinex and Flonase.  Use albuterol every 4-6 hours as needed.  Make sure you  rest and drink plenty of fluid.  If your symptoms are  improving within a week please return for reevaluation.  If anything worsens you need to be seen immediately.  Continue with your prescribed medications to help with your shoulder pain.  Follow-up with sports medicine; call to schedule an appointment.  If anything worsens return for reevaluation.  Your blood pressure is elevated.  I suspect this is because you are sick.  Please monitor this at home.  If it remains above 140/90 should be reevaluated.  If you develop any chest pain, shortness of breath, headache, vision change, dizziness in the setting of high blood pressure you need to go to the emergency room.     ED Prescriptions     Medication Sig Dispense Auth. Provider   amoxicillin-clavulanate (AUGMENTIN) 875-125 MG tablet Take 1 tablet by mouth every 12 (twelve) hours. 14 tablet Karlena Luebke K, PA-C   albuterol (VENTOLIN HFA) 108 (90 Base) MCG/ACT inhaler Inhale 1-2 puffs into the lungs every 6 (six) hours as needed for wheezing or shortness of breath. 18 g Khyson Sebesta K, PA-C      PDMP not reviewed this encounter.   Jeani Hawking, PA-C 04/03/22 1936

## 2022-07-12 ENCOUNTER — Ambulatory Visit (HOSPITAL_COMMUNITY)
Admission: EM | Admit: 2022-07-12 | Discharge: 2022-07-12 | Disposition: A | Discharge status: Home / Self Care | Payer: 59 | Attending: Physician Assistant | Admitting: Physician Assistant

## 2022-07-12 ENCOUNTER — Encounter (HOSPITAL_COMMUNITY): Payer: Self-pay

## 2022-07-12 DIAGNOSIS — R1012 Left upper quadrant pain: Secondary | ICD-10-CM

## 2022-07-12 DIAGNOSIS — R1013 Epigastric pain: Secondary | ICD-10-CM | POA: Diagnosis not present

## 2022-07-12 MED ORDER — OMEPRAZOLE 40 MG PO CPDR
40.0000 mg | DELAYED_RELEASE_CAPSULE | Freq: Every day | ORAL | 0 refills | Status: DC
Start: 2022-07-12 — End: 2023-01-28

## 2022-07-12 NOTE — ED Triage Notes (Signed)
Patient here today with constant sharp pain in her LUQ abdomen today. Some nausea. She woke up with the pain. She took Excedrin extra strength with a small amount of relief.

## 2022-07-12 NOTE — ED Provider Notes (Signed)
[facility]    CSN: [id number] Arrival date & time: 07/12/22  1412      History   Chief Complaint Chief Complaint  Patient presents with   Abdominal Pain    HPI Tanya Sanchez is a 32 y.o. female.   32 year old female presents with left upper quadrant abdominal pain.  Patient indicates last night she had significant indigestion, heartburn and reflux.  Patient indicates that she did have some nausea last night with the indigestion or heartburn.  She indicates she did take some Zantac and Pepcid with minimal relief of her symptoms.  She indicates today she has continued to have intermittent left upper quadrant pain which is worse when she tends to move, she has taken some Excedrin with mild relief.  She indicates she has had some nausea but has not had any vomiting episodes.  She is without fever or chills.  She indicates she has not had any trauma to the area and has not been doing any heavy lifting recently to cause her symptoms.  She does indicates she has indigestion, heartburn or reflux on a intermittent basis and will usually take Zantac which most of the time helps relieve her symptoms.  She indicates that she is urinating and having normal bowel movements.   Abdominal Pain   Past Medical History:  Diagnosis Date   ADHD (attention deficit hyperactivity disorder)    Arthritis    Asthma    Gallstones    Panic attack 2016    Patient Active Problem List   Diagnosis Date Noted   Lumbar herniated disc 08/28/2016   Facet hypertrophy of lumbar region 08/28/2016   Symptomatic cholelithiasis 10/14/2014   Epigastric abdominal pain 10/11/2014   Chronic tension headaches 10/11/2014   Vitamin D deficiency 07/31/2014   Panic attacks 07/30/2014   Poor dentition 07/30/2014   Allergic rhinitis 07/30/2014    Past Surgical History:  Procedure Laterality Date   CHOLECYSTECTOMY N/A 10/15/2014   Procedure: LAPAROSCOPIC CHOLECYSTECTOMY WITH INTRAOPERATIVE CHOLANGIOGRAM;   Surgeon: Jimmye Norman, MD;  Location: [facility] OR;  Service: General;  Laterality: N/A;    OB History   No obstetric history on file.      Home Medications    Prior to Admission medications   Medication Sig Start Date End Date Taking? Authorizing Provider  albuterol (VENTOLIN HFA) 108 (90 Base) MCG/ACT inhaler Inhale 1-2 puffs into the lungs every 6 (six) hours as needed for wheezing or shortness of breath. 04/03/22  Yes Raspet, Noberto Retort, PA-C  omeprazole (PRILOSEC) 40 MG capsule Take 1 capsule (40 mg total) by mouth daily. 07/12/22  Yes Ellsworth Lennox, PA-C  amoxicillin-clavulanate (AUGMENTIN) 875-125 MG tablet Take 1 tablet by mouth every 12 (twelve) hours. 04/03/22   Raspet, Noberto Retort, PA-C  azelastine (ASTELIN) 0.1 % nasal spray Place 1 spray into both nostrils 2 (two) times daily. Use in each nostril as directed 09/06/21   Allwardt, Alyssa M, PA-C  cyclobenzaprine (FEXMID) 7.5 MG tablet Take 1 tablet (7.5 mg total) by mouth 2 (two) times daily as needed. 04/22/21     DULoxetine (CYMBALTA) 30 MG capsule take 1 capsule (30 mg) by oral route once daily 07/10/21     hydrOXYzine (ATARAX) 25 MG tablet take 1 tablet (25 mg) by oral route 2 times per day as needed 06/05/21     naproxen (NAPROSYN) 500 MG tablet Take 1 tablet (500 mg total) by mouth 2 (two) times daily as needed (pain). 01/18/22   Zenia Resides, MD  rizatriptan (  MAXALT) 10 MG tablet Take 1 tablet (10 mg total) by mouth as needed for migraine. May repeat in 2 hours if needed. Max of 2 tablets in one day and max of 4 tablets in one week. 07/05/21   Arthor Captain, PA-C  topiramate (TOPAMAX) 25 MG tablet Take 1 tablet (25 mg total) by mouth at bedtime. 04/22/21     topiramate (TOPAMAX) 25 MG tablet Take 1 tablet (25 mg) by oral route once daily at bedtime 07/10/21     Vitamin D, Ergocalciferol, (DRISDOL) 1.25 MG (50000 UNIT) CAPS capsule Take one capsule by mouth every 7 (seven) days. 04/25/21       Family History Family History  Problem Relation  Age of Onset   Heart disease Maternal Grandmother    Hypertension Maternal Grandmother    Depression Maternal Grandmother    Arthritis Maternal Grandmother    Depression Mother    Cancer Mother    Bladder Cancer Mother    Depression Sister     Social History Social History   Tobacco Use   Smoking status: Never   Smokeless tobacco: Never  Substance Use Topics   Alcohol use: No   Drug use: No     Allergies   Prozac [fluoxetine]   Review of Systems Review of Systems  Gastrointestinal:  Positive for abdominal pain (left upper abdomen).     Physical Exam Triage Vital Signs ED Triage Vitals  Enc Vitals Group     BP 07/12/22 1440 135/87     Pulse Rate 07/12/22 1440 63     Resp 07/12/22 1440 16     Temp 07/12/22 1440 98.3 F (36.8 C)     Temp Source 07/12/22 1440 Oral     SpO2 07/12/22 1440 99 %     Weight 07/12/22 1439 170 lb (77.1 kg)     Height 07/12/22 1439  (1.626 m)     Head Circumference --      Peak Flow --      Pain Score 07/12/22 1439 9     Pain Loc --      Pain Edu? --      Excl. in [place]? --    No data found.  Updated Vital Signs BP 135/87 (BP Location: Left Arm)   Pulse 63   Temp 98.3 F (36.8 C) (Oral)   Resp 16   Ht  (1.626 m)   Wt 170 lb (77.1 kg)   LMP 07/12/2022 (Exact Date)   SpO2 99%   BMI 29.18 kg/m   Visual Acuity Right Eye Distance:   Left Eye Distance:   Bilateral Distance:    Right Eye Near:   Left Eye Near:    Bilateral Near:     Physical Exam Cardiovascular:     Rate and Rhythm: Normal rate and regular rhythm.     Heart sounds: Normal heart sounds.  Pulmonary:     Effort: Pulmonary effort is normal.     Breath sounds: Normal air entry. No wheezing, rhonchi or rales.  Abdominal:     General: Abdomen is flat. Bowel sounds are normal.     Palpations: Abdomen is soft.     Tenderness: There is no abdominal tenderness. There is no guarding or rebound.     Comments: Abdomen: Mild pain is palpated along the left  upper costal margin area at the MCL.      UC Treatments / Results  Labs (all labs ordered are listed, but only abnormal results are displayed)  Labs Reviewed - No data to display  EKG   Radiology No results found.  Procedures Procedures (including critical care time)  Medications Ordered in UC Medications - No data to display  Initial Impression / Assessment and Plan / UC Course  I have reviewed the triage vital signs and the nursing notes.  Pertinent labs & imaging results that were available during my care of the patient were reviewed by me and considered in my medical decision making (see chart for details).    Plan: The diagnosis will be treated with the following: 1.  Dyspepsia: A.  Prilosec 40 mg once daily to help relieve dyspepsia and reflux. 2.  Left upper quadrant abdominal pain. A.  Prilosec 40 mg once daily to help relieve dyspepsia and reflux. 3.  Advised follow-up PCP return to urgent care as needed.  Final Clinical Impressions(s) / UC Diagnoses   Final diagnoses:  Dyspepsia  Left upper quadrant abdominal pain     Discharge Instructions      Advised take the Prilosec 40 mg, 1 tablet daily to relieve discomfort. Advised to avoid spicy, greasy, fried foods for the next several days. Advised to avoid caffeinated drinks.  Advised follow-up PCP return to urgent care if symptoms fail to improve.    ED Prescriptions     Medication Sig Dispense Auth. Provider   omeprazole (PRILOSEC) 40 MG capsule Take 1 capsule (40 mg total) by mouth daily. 20 capsule Ellsworth Lennox, PA-C      PDMP not reviewed this encounter.   Ellsworth Lennox, PA-C 07/12/22 1517

## 2022-07-12 NOTE — Discharge Instructions (Addendum)
Advised take the Prilosec 40 mg, 1 tablet daily to relieve discomfort. Advised to avoid spicy, greasy, fried foods for the next several days. Advised to avoid caffeinated drinks.  Advised follow-up PCP return to urgent care if symptoms fail to improve.

## 2022-09-10 ENCOUNTER — Encounter (HOSPITAL_COMMUNITY): Payer: Self-pay | Admitting: *Deleted

## 2022-09-10 ENCOUNTER — Emergency Department (HOSPITAL_COMMUNITY)
Admission: EM | Admit: 2022-09-10 | Discharge: 2022-09-10 | Disposition: A | Discharge status: Home / Self Care | Payer: 59 | Attending: Emergency Medicine | Admitting: Emergency Medicine

## 2022-09-10 ENCOUNTER — Other Ambulatory Visit: Payer: Self-pay

## 2022-09-10 ENCOUNTER — Emergency Department (HOSPITAL_COMMUNITY): Payer: 59

## 2022-09-10 DIAGNOSIS — R0789 Other chest pain: Secondary | ICD-10-CM | POA: Insufficient documentation

## 2022-09-10 DIAGNOSIS — R42 Dizziness and giddiness: Secondary | ICD-10-CM | POA: Diagnosis not present

## 2022-09-10 DIAGNOSIS — R0602 Shortness of breath: Secondary | ICD-10-CM | POA: Diagnosis not present

## 2022-09-10 DIAGNOSIS — R079 Chest pain, unspecified: Secondary | ICD-10-CM | POA: Diagnosis not present

## 2022-09-10 DIAGNOSIS — E876 Hypokalemia: Secondary | ICD-10-CM | POA: Diagnosis not present

## 2022-09-10 DIAGNOSIS — J45909 Unspecified asthma, uncomplicated: Secondary | ICD-10-CM | POA: Diagnosis not present

## 2022-09-10 LAB — CBC
HCT: 40.7 % (ref 36.0–46.0)
Hemoglobin: 14.5 g/dL (ref 12.0–15.0)
MCH: 30.7 pg (ref 26.0–34.0)
MCHC: 35.6 g/dL (ref 30.0–36.0)
MCV: 86.2 fL (ref 80.0–100.0)
Platelets: 268 10*3/uL (ref 150–400)
RBC: 4.72 MIL/uL (ref 3.87–5.11)
RDW: 11.2 % — ABNORMAL LOW (ref 11.5–15.5)
WBC: 7.2 10*3/uL (ref 4.0–10.5)
nRBC: 0 % (ref 0.0–0.2)

## 2022-09-10 LAB — BASIC METABOLIC PANEL
Anion gap: 12 (ref 5–15)
BUN: 15 mg/dL (ref 6–20)
CO2: 21 mmol/L — ABNORMAL LOW (ref 22–32)
Calcium: 9.5 mg/dL (ref 8.9–10.3)
Chloride: 104 mmol/L (ref 98–111)
Creatinine, Ser: 0.81 mg/dL (ref 0.44–1.00)
GFR, Estimated: >60 mL/min (ref >60)
Glucose, Bld: 85 mg/dL (ref 70–99)
Potassium: 3 mmol/L — ABNORMAL LOW (ref 3.5–5.1)
Sodium: 137 mmol/L (ref 135–145)

## 2022-09-10 LAB — I-STAT BETA HCG BLOOD, ED (MC, WL, AP ONLY): I-stat hCG, quantitative: <5 m[IU]/mL (ref <5)

## 2022-09-10 LAB — TROPONIN I (HIGH SENSITIVITY)
Troponin I (High Sensitivity): 3 ng/L (ref <18)
Troponin I (High Sensitivity): 3 ng/L (ref <18)

## 2022-09-10 MED ORDER — POTASSIUM CHLORIDE CRYS ER 20 MEQ PO TBCR: 20.0000 meq | EXTENDED_RELEASE_TABLET | Freq: Two times a day (BID) | ORAL | 0 refills | Status: DC

## 2022-09-10 NOTE — ED Triage Notes (Signed)
Chest pain since 1630 while she was at work  also some shortness of breath  lmp now

## 2022-09-10 NOTE — Discharge Instructions (Signed)
Your workup and physical exam are very reassuring today.  Please increase your hydration.Your potassium was mildly low.  Please take the prescribed potassium supplement for the next few days.  Follow-up your primary care doctor return to the ER with any new severe symptoms.

## 2022-09-10 NOTE — ED Notes (Signed)
Patient ambulated well, felt a little winded but her O2 saturations stayed in the 90s with a good pleth, on average it was 97-100%. With a desat to 93% upon her sitting back on the stretcher, before rising again to 100%

## 2022-09-10 NOTE — ED Provider Notes (Signed)
Tanya Sanchez EMERGENCY DEPARTMENT AT [facility] Provider Note   CSN: [id number] Arrival date & time: 09/10/22  0006     History  Chief Complaint  Patient presents with   Chest Pain    Tanya Sanchez is a 32 y.o. female who presents with concern for SOB, lightheadedness, and chest tightness after becoming overheated while at work.  States that she went into the cooler at her job site and rested and ate something and started to feel better but wanted to be evaluated in the emergency department.  She is currently on her menstrual cycle.  States she is feeling much improved now after some water and some resting in the emergency department.  Asymptomatic at this time.  I personally reviewed medical records.  Patient has history of panic attacks, asthma.  No anticoagulation.  No episodes of syncope.  She denies any prescription medications daily.  HPI     Home Medications Prior to Admission medications   Medication Sig Start Date End Date Taking? Authorizing Provider  albuterol (VENTOLIN HFA) 108 (90 Base) MCG/ACT inhaler Inhale 1-2 puffs into the lungs every 6 (six) hours as needed for wheezing or shortness of breath. 04/03/22   Raspet, Noberto Retort, PA-C  amoxicillin-clavulanate (AUGMENTIN) 875-125 MG tablet Take 1 tablet by mouth every 12 (twelve) hours. 04/03/22   Raspet, Noberto Retort, PA-C  azelastine (ASTELIN) 0.1 % nasal spray Place 1 spray into both nostrils 2 (two) times daily. Use in each nostril as directed 09/06/21   Allwardt, Alyssa M, PA-C  cyclobenzaprine (FEXMID) 7.5 MG tablet Take 1 tablet (7.5 mg total) by mouth 2 (two) times daily as needed. 04/22/21     DULoxetine (CYMBALTA) 30 MG capsule take 1 capsule (30 mg) by oral route once daily 07/10/21     hydrOXYzine (ATARAX) 25 MG tablet take 1 tablet (25 mg) by oral route 2 times per day as needed 06/05/21     naproxen (NAPROSYN) 500 MG tablet Take 1 tablet (500 mg total) by mouth 2 (two) times daily as needed (pain). 01/18/22    Zenia Resides, MD  omeprazole (PRILOSEC) 40 MG capsule Take 1 capsule (40 mg total) by mouth daily. 07/12/22   Ellsworth Lennox, PA-C  rizatriptan (MAXALT) 10 MG tablet Take 1 tablet (10 mg total) by mouth as needed for migraine. May repeat in 2 hours if needed. Max of 2 tablets in one day and max of 4 tablets in one week. 07/05/21   Arthor Captain, PA-C  topiramate (TOPAMAX) 25 MG tablet Take 1 tablet (25 mg total) by mouth at bedtime. 04/22/21     topiramate (TOPAMAX) 25 MG tablet Take 1 tablet (25 mg) by oral route once daily at bedtime 07/10/21     Vitamin D, Ergocalciferol, (DRISDOL) 1.25 MG (50000 UNIT) CAPS capsule Take one capsule by mouth every 7 (seven) days. 04/25/21         Allergies    Prozac [fluoxetine]    Review of Systems   Review of Systems  Constitutional: Negative.   HENT: Negative.    Eyes: Negative.   Respiratory:  Positive for chest tightness and shortness of breath. Negative for cough.   Cardiovascular: Negative.   Gastrointestinal: Negative.   Musculoskeletal: Negative.   Neurological:  Positive for light-headedness. Negative for dizziness, facial asymmetry and headaches.    Physical Exam Updated Vital Signs BP 104/70   Pulse 88   Temp 97.7 F (36.5 C)   Resp 16   Ht 5\' 4"  (1.626 m)  Wt 77.1 kg   LMP 09/10/2022   SpO2 100%   BMI 29.18 kg/m  Physical Exam Vitals and nursing note reviewed.  Constitutional:      Appearance: She is obese. She is not ill-appearing or toxic-appearing.  HENT:     Head: Normocephalic and atraumatic.     Mouth/Throat:     Mouth: Mucous membranes are moist.     Pharynx: No oropharyngeal exudate or posterior oropharyngeal erythema.  Eyes:     General:        Right eye: No discharge.        Left eye: No discharge.     Extraocular Movements: Extraocular movements intact.     Conjunctiva/sclera: Conjunctivae normal.     Pupils: Pupils are equal, round, and reactive to light.  Cardiovascular:     Rate and Rhythm: Normal  rate and regular rhythm.     Pulses: Normal pulses.     Heart sounds: Normal heart sounds. No murmur heard. Pulmonary:     Effort: Pulmonary effort is normal. No respiratory distress.     Breath sounds: Normal breath sounds. No wheezing or rales.  Chest:     Chest wall: No mass, tenderness or edema.  Abdominal:     General: There is no distension.     Palpations: Abdomen is soft.     Tenderness: There is no abdominal tenderness.  Musculoskeletal:        General: No deformity.     Cervical back: Normal range of motion and neck supple.  Skin:    General: Skin is warm and dry.     Capillary Refill: Capillary refill takes less than 2 seconds.  Neurological:     General: No focal deficit present.     Mental Status: She is alert and oriented to person, place, and time. Mental status is at baseline.  Psychiatric:        Mood and Affect: Mood normal.     ED Results / Procedures / Treatments   Labs (all labs ordered are listed, but only abnormal results are displayed) Labs Reviewed  BASIC METABOLIC PANEL - Abnormal; Notable for the following components:      Result Value   Potassium 3.0 (*)    CO2 21 (*)    All other components within normal limits  CBC - Abnormal; Notable for the following components:   RDW 11.2 (*)    All other components within normal limits  I-STAT BETA HCG BLOOD, ED ([facility], [facility], [facility] ONLY)  TROPONIN I (HIGH SENSITIVITY)  TROPONIN I (HIGH SENSITIVITY)    EKG EKG Interpretation  Date/Time:  Thursday September 10 2022 00:03:21 EDT Ventricular Rate:  81 PR Interval:  178 QRS Duration: 108 QT Interval:  370 QTC Calculation: 429 R Axis:   92 Text Interpretation: Normal sinus rhythm Rightward axis Borderline ECG When compared with ECG of 26-Aug-2016 20:13, PREVIOUS ECG IS PRESENT Confirmed by Kennis Carina [phone]) on 09/10/2022 2:56:36 AM  Radiology DG Chest 2 View  Result Date: 09/10/2022 CLINICAL DATA:  Chest pain and shortness of breath EXAM: CHEST - 2 VIEW  COMPARISON:  09/06/2021 FINDINGS: The heart size and mediastinal contours are within normal limits. Both lungs are clear. The visualized skeletal structures are unremarkable. IMPRESSION: No active cardiopulmonary disease. Electronically Signed   By: Alcide Clever M.D.   On: 09/10/2022 01:05    Procedures Procedures    Medications Ordered in ED Medications - No data to display  ED Course/ Medical Decision Making/ A&P  Medical Decision Making  32 year old female Who presents with concern for shortness of breath and lightheadedness at work.  Hypertensive on intake, vital signs otherwise normal.  Cardiopulmonary exam is normal, abdominal exam is benign.  Neurologic exam is nonfocal.  Patient overall well-appearing at this time.   Amount and/or Complexity of Data Reviewed Labs: ordered.    Details: CBC without leukocytosis or anemia, BMP with hypokalemia of 3, troponin negative x 2, patient is not pregnant.  Radiology: ordered.    Details:  Chest x-ray negative for acute cardiopulmonary disease.  ECG/medicine tests:     Details:  EKG with normal sinus rhythm.     Patient ambulated in emergency department with maintenance of normal oxygen saturation and heart rate.  While exact etiology of patient's symptoms earlier in evening remain unclear there does not appear to be any emergent etiology at this time.  No indication for further ED workup or inpatient management.  Marwah  voiced understanding of her medical evaluation and treatment plan. Each of their questions answered to their expressed satisfaction.  Return precautions were given.  Patient is well-appearing, stable, and was discharged in good condition.  This chart was dictated using voice recognition software, Dragon. Despite the best efforts of this provider to proofread and correct errors, errors may still occur which can change documentation meaning.   Final Clinical Impression(s) / ED  Diagnoses Final diagnoses:  Lightheadedness    Rx / DC Orders ED Discharge Orders     None         Sherrilee Gilles 09/10/22 0554    Sabas Sous, MD 09/10/22 0700

## 2022-11-30 DIAGNOSIS — R5383 Other fatigue: Secondary | ICD-10-CM | POA: Diagnosis not present

## 2022-11-30 DIAGNOSIS — J45901 Unspecified asthma with (acute) exacerbation: Secondary | ICD-10-CM | POA: Diagnosis not present

## 2022-11-30 DIAGNOSIS — R051 Acute cough: Secondary | ICD-10-CM | POA: Diagnosis not present

## 2022-11-30 DIAGNOSIS — Z20822 Contact with and (suspected) exposure to covid-19: Secondary | ICD-10-CM | POA: Diagnosis not present

## 2023-01-28 ENCOUNTER — Ambulatory Visit (HOSPITAL_COMMUNITY)
Admission: EM | Admit: 2023-01-28 | Discharge: 2023-01-28 | Disposition: A | Discharge status: Home / Self Care | Payer: 59 | Attending: Family Medicine | Admitting: Family Medicine

## 2023-01-28 ENCOUNTER — Ambulatory Visit (INDEPENDENT_AMBULATORY_CARE_PROVIDER_SITE_OTHER): Payer: 59

## 2023-01-28 ENCOUNTER — Encounter (HOSPITAL_COMMUNITY): Payer: Self-pay

## 2023-01-28 DIAGNOSIS — R051 Acute cough: Secondary | ICD-10-CM | POA: Diagnosis not present

## 2023-01-28 DIAGNOSIS — M25511 Pain in right shoulder: Secondary | ICD-10-CM | POA: Diagnosis not present

## 2023-01-28 MED ORDER — KETOROLAC TROMETHAMINE 10 MG PO TABS: 10.0000 mg | ORAL_TABLET | Freq: Four times a day (QID) | ORAL | 0 refills | Status: DC | PRN

## 2023-01-28 MED ORDER — TIZANIDINE HCL 4 MG PO TABS: 4.0000 mg | ORAL_TABLET | Freq: Three times a day (TID) | ORAL | 0 refills | Status: DC | PRN

## 2023-01-28 MED ORDER — BENZONATATE 100 MG PO CAPS: 100.0000 mg | ORAL_CAPSULE | Freq: Three times a day (TID) | ORAL | 0 refills | Status: DC | PRN

## 2023-01-28 MED ORDER — KETOROLAC TROMETHAMINE 30 MG/ML IJ SOLN
INTRAMUSCULAR | Status: AC
  Filled 2023-01-28: qty 1

## 2023-01-28 MED ORDER — KETOROLAC TROMETHAMINE 30 MG/ML IJ SOLN
30.0000 mg | Freq: Once | INTRAMUSCULAR | Status: AC
  Administered 2023-01-28: 30 mg via INTRAMUSCULAR

## 2023-01-28 NOTE — ED Triage Notes (Signed)
Right shoulder pain radiating to the neck and down into th arm. Onset a few weeks ago, No falls or known injuries. No history of shoulder problems.   Cough that is dry, SOB, and runny nose onset last night. No known sick exposure. Tried Alka seltzer plus with slight relief.

## 2023-01-28 NOTE — Discharge Instructions (Signed)
Your x-ray did not show any bony problem by my review. The radiologist will also read your x-ray, and if their interpretation differs significantly from mine, we will call you.  You have been given a shot of Toradol 30 mg today.  Ketorolac 10 mg tablets--take 1 tablet every 6 hours as needed for pain.  This is the same medicine that is in the shot we just gave you   Take tizanidine 4 mg--1 every 8 hours as needed for muscle spasms; this medication can cause dizziness and sleepiness  Take benzonatate 100 mg, 1 tab every 8 hours as needed for cough.

## 2023-01-28 NOTE — ED Provider Notes (Signed)
[facility]    CSN: [id number] Arrival date & time: 01/28/23  1659      History   Chief Complaint Chief Complaint  Patient presents with   Cough   Shoulder Pain    HPI Tanya Sanchez is a 32 y.o. female.    Cough Shoulder Pain Here for right shoulder pain that's been going on for several weeks, coming and going. Worse when at work. No fall and trauma.  Excedrin has helped some when she takes that.  Pain is 8-9/10.  Has had a cough since last night.  She also had some rhinorrhea that is already resolved.  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      Home Medications    Prior to Admission medications   Medication Sig Start Date End Date Taking? Authorizing Provider  benzonatate (TESSALON) 100 MG capsule Take 1 capsule (100 mg total) by mouth 3 (three) times daily as needed for cough. 01/28/23  Yes Zenia Resides, MD  ketorolac (TORADOL) 10 MG tablet Take 1 tablet (10 mg total) by mouth every 6 (six) hours as needed (pain). 01/28/23  Yes Bayron Dalto, Janace Aris, MD  tiZANidine (ZANAFLEX) 4 MG tablet Take 1 tablet (4 mg total) by mouth every 8 (eight) hours as  needed for muscle spasms. 01/28/23  Yes Zenia Resides, MD  albuterol (VENTOLIN HFA) 108 (90 Base) MCG/ACT inhaler Inhale 1-2 puffs into the lungs every 6 (six) hours as needed for wheezing or shortness of breath. 04/03/22   Raspet, Noberto Retort, PA-C    Family History Family History  Problem Relation Age of Onset   Heart disease Maternal Grandmother    Hypertension Maternal Grandmother    Depression Maternal Grandmother    Arthritis Maternal Grandmother    Depression Mother    Cancer Mother    Bladder Cancer Mother    Depression Sister     Social History Social History   Tobacco Use   Smoking status: Never   Smokeless tobacco: Never  Substance Use Topics   Alcohol use: No   Drug use: No     Allergies   Prozac [fluoxetine]   Review of Systems Review of Systems  Respiratory:  Positive for cough.      Physical Exam Triage Vital Signs ED Triage Vitals  Encounter Vitals Group     BP 01/28/23 1736 115/79     Systolic BP Percentile --      Diastolic BP Percentile --      Pulse Rate 01/28/23 1736 75     Resp 01/28/23 1736 18     Temp 01/28/23 1736 97.8 F (36.6 C)     Temp Source  01/28/23 1736 Oral     SpO2 01/28/23 1736 97 %     Weight 01/28/23 1736 169 lb 15.6 oz (77.1 kg)     Height 01/28/23 1736 5\' 4"  (1.626 m)     Head Circumference --      Peak Flow --      Pain Score 01/28/23 1733 9     Pain Loc --      Pain Education --      Exclude from Growth Chart --    No data found.  Updated Vital Signs BP 115/79 (BP Location: Right Arm)   Pulse 75   Temp 97.8 F (36.6 C) (Oral)   Resp 18   Ht 5\' 4"  (1.626 m)   Wt 77.1 kg   LMP 01/28/2023 (Exact Date)   SpO2 97%   BMI 29.18 kg/m   Visual Acuity Right Eye Distance:   Left Eye Distance:   Bilateral Distance:    Right Eye Near:   Left Eye Near:    Bilateral Near:     Physical Exam Vitals reviewed.  Constitutional:      General: She is not in acute distress.    Appearance: She is not  ill-appearing, toxic-appearing or diaphoretic.  HENT:     Mouth/Throat:     Mouth: Mucous membranes are moist.  Eyes:     Extraocular Movements: Extraocular movements intact.     Conjunctiva/sclera: Conjunctivae normal.     Pupils: Pupils are equal, round, and reactive to light.  Cardiovascular:     Rate and Rhythm: Normal rate and regular rhythm.     Heart sounds: No murmur heard. Pulmonary:     Effort: Pulmonary effort is normal. No respiratory distress.     Breath sounds: Normal breath sounds. No stridor. No wheezing, rhonchi or rales.  Musculoskeletal:     Cervical back: Neck supple.     Comments: There is some pain on range of motion of right shoulder. Tender over posterior musculature.  Lymphadenopathy:     Cervical: No cervical adenopathy.  Skin:    Coloration: Skin is not pale.  Neurological:     General: No focal deficit present.     Mental Status: She is alert and oriented to person, place, and time.  Psychiatric:        Behavior: Behavior normal.      UC Treatments / Results  Labs (all labs ordered are listed, but only abnormal results are displayed) Labs Reviewed - No data to display  EKG   Radiology No results found.  Procedures Procedures (including critical care time)  Medications Ordered in UC Medications  ketorolac (TORADOL) 30 MG/ML injection 30 mg (has no administration in time range)    Initial Impression / Assessment and Plan / UC Course  I have reviewed the triage vital signs and the nursing notes.  Pertinent labs & imaging results that were available during my care of the patient were reviewed by me and considered in my medical decision making (see chart for details).     X-rays by my review do not show any bony problem.  She is advised of radiology over read.  Toradol injection is given here and Toradol tablets are sent to the pharmacy tizanidine is sent in for muscle spasm and pain.  Tessalon Perles are sent in for the cough.  No  viral testing is done today as the rhinorrhea that she had is already improved and resolved Final Clinical Impressions(s) / UC Diagnoses   Final diagnoses:  Acute pain of right shoulder  Acute cough     Discharge Instructions      Your x-ray did not show any bony problem by my review. The radiologist will also read your x-ray, and if their interpretation differs significantly from mine, we will call you.  You have been given a shot of Toradol 30 mg today.  Ketorolac 10 mg tablets--take 1 tablet every 6 hours as needed for pain.  This is the same medicine that is in the shot we just gave you   Take tizanidine 4 mg--1 every 8 hours as needed for muscle spasms; this medication can cause dizziness and sleepiness  Take benzonatate 100 mg, 1 tab every 8 hours as needed for cough.         ED Prescriptions     Medication Sig Dispense Auth. Provider   benzonatate (TESSALON) 100 MG capsule Take 1 capsule (100 mg total) by mouth 3 (three) times daily as needed for cough. 21 capsule Zenia Resides, MD   ketorolac (TORADOL) 10 MG tablet Take 1 tablet (10 mg total) by mouth every 6 (six) hours as needed (pain). 20 tablet Sokha Craker, Janace Aris, MD   tiZANidine (ZANAFLEX) 4 MG tablet Take 1 tablet (4 mg total) by mouth every 8 (eight) hours as needed for muscle spasms. 15 tablet Pate Aylward, Janace Aris, MD      PDMP not reviewed this encounter.   Zenia Resides, MD 01/28/23 [phone]

## 2023-04-06 ENCOUNTER — Ambulatory Visit (HOSPITAL_COMMUNITY)
Admission: EM | Admit: 2023-04-06 | Discharge: 2023-04-06 | Disposition: A | Discharge status: Home / Self Care | Payer: 59 | Attending: Physician Assistant | Admitting: Physician Assistant

## 2023-04-06 ENCOUNTER — Encounter (HOSPITAL_COMMUNITY): Payer: Self-pay

## 2023-04-06 DIAGNOSIS — M79641 Pain in right hand: Secondary | ICD-10-CM | POA: Diagnosis not present

## 2023-04-06 DIAGNOSIS — S63501A Unspecified sprain of right wrist, initial encounter: Secondary | ICD-10-CM

## 2023-04-06 MED ORDER — DICLOFENAC SODIUM 75 MG PO TBEC: 75.0000 mg | DELAYED_RELEASE_TABLET | Freq: Two times a day (BID) | ORAL | 0 refills | Status: AC

## 2023-04-06 NOTE — ED Provider Notes (Signed)
 [facility]    CSN: [id number] Arrival date & time: 04/06/23  1533      History   Chief Complaint Chief Complaint  Patient presents with   Wrist Pain    HPI Tanya Sanchez is a 33 y.o. female.   Pt complains of pain in her wrist.  Pt reports pain began 3 days ago in the top of her hand.  Pt reports she has eczema but apin is not from rash.  Pt reports using her hands at work.    The history is provided by the patient. No language interpreter was used.  Wrist Pain This is a new problem. The problem has not changed since onset.Nothing aggravates the symptoms. She has tried nothing for the symptoms. The treatment provided no relief.    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      Home Medications    Prior to Admission medications   Medication Sig Start Date End Date Taking? Authorizing Provider  diclofenac  (VOLTAREN ) 75 MG EC tablet Take 1 tablet (75 mg total) by mouth 2 (two) times daily. 04/06/23  Yes Flint Sonny POUR, PA-C  albuterol  (VENTOLIN  HFA) 108 (90 Base) MCG/ACT inhaler Inhale 1-2 puffs into the lungs every 6 (six) hours as needed for wheezing or shortness of breath. 04/03/22    Raspet, Rocky POUR, PA-C    Family History Family History  Problem Relation Age of Onset   Heart disease Maternal Grandmother    Hypertension Maternal Grandmother    Depression Maternal Grandmother    Arthritis Maternal Grandmother    Depression Mother    Cancer Mother    Bladder Cancer Mother    Depression Sister     Social History Social History   Tobacco Use   Smoking status: Never   Smokeless tobacco: Never  Substance Use Topics   Alcohol use: No   Drug use: No     Allergies   Prozac  [fluoxetine ]   Review of Systems Review of Systems  All other systems reviewed and are negative.    Physical Exam Triage Vital Signs ED Triage Vitals [04/06/23 1558]  Encounter Vitals Group     BP 131/86     Systolic BP Percentile      Diastolic BP Percentile      Pulse Rate 81     Resp 18     Temp 98.1 F (36.7 C)     Temp Source Oral     SpO2 98 %     Weight      Height      Head Circumference      Peak Flow      Pain Score 10     Pain  Loc      Pain Education      Exclude from Growth Chart    No data found.  Updated Vital Signs BP 131/86 (BP Location: Right Arm)   Pulse 81   Temp 98.1 F (36.7 C) (Oral)   Resp 18   LMP 04/05/2023   SpO2 98%   Visual Acuity Right Eye Distance:   Left Eye Distance:   Bilateral Distance:    Right Eye Near:   Left Eye Near:    Bilateral Near:     Physical Exam Vitals and nursing note reviewed.  Constitutional:      Appearance: She is well-developed.  HENT:     Head: Normocephalic.  Abdominal:     General: There is no distension.  Musculoskeletal:        General: No swelling or tenderness. Normal range of motion.     Comments: Rash dorsal hand,  no swelling,  from,  nv and ns intact   Skin:    General: Skin is warm.  Neurological:     General: No focal deficit present.     Mental Status: She is alert and oriented to person, place, and time.      UC Treatments / Results  Labs (all labs ordered are listed,  but only abnormal results are displayed) Labs Reviewed - No data to display  EKG   Radiology No results found.  Procedures Procedures (including critical care time)  Medications Ordered in UC Medications - No data to display  Initial Impression / Assessment and Plan / UC Course  I have reviewed the triage vital signs and the nursing notes.  Pertinent labs & imaging results that were available during my care of the patient were reviewed by me and considered in my medical decision making (see chart for details).     Pt placed in an ace wrap, referral to orthopaedist for followup.  Pt given rx for voltaren      Final diagnoses:  Hand pain, right     Discharge Instructions      Follow up with Orthopaedist for evaltuion if pain persist    ED Prescriptions     Medication Sig Dispense Auth. Provider   diclofenac  (VOLTAREN ) 75 MG EC tablet Take 1 tablet (75 mg total) by mouth 2 (two) times daily. 20 tablet Jalani Rominger K, PA-C      PDMP not reviewed this encounter.   Flint Sonny POUR, PA-C 04/06/23 2032

## 2023-04-06 NOTE — Discharge Instructions (Signed)
 Follow up with Orthopaedist for evaltuion if pain persist

## 2023-04-06 NOTE — ED Triage Notes (Signed)
 Pt c/o lt wrist pain and numbness radiating to back of hand and up arm x3 days. States each episode last around 15 mins. 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 Took Excedrin, used ice and head with some relief.

## 2023-05-10 ENCOUNTER — Ambulatory Visit (HOSPITAL_COMMUNITY)
Admission: EM | Admit: 2023-05-10 | Discharge: 2023-05-10 | Disposition: A | Discharge status: Home / Self Care | Payer: 59 | Attending: Physician Assistant | Admitting: Physician Assistant

## 2023-05-10 ENCOUNTER — Encounter (HOSPITAL_COMMUNITY): Payer: Self-pay

## 2023-05-10 DIAGNOSIS — M25511 Pain in right shoulder: Secondary | ICD-10-CM | POA: Diagnosis not present

## 2023-05-10 DIAGNOSIS — R202 Paresthesia of skin: Secondary | ICD-10-CM

## 2023-05-10 MED ORDER — PREDNISONE 20 MG PO TABS: ORAL_TABLET | ORAL | 0 refills | Status: AC

## 2023-05-10 NOTE — ED Provider Notes (Signed)
 [facility]    CSN: [id number] Arrival date & time: 05/10/23  1513      History   Chief Complaint Chief Complaint  Patient presents with   Shoulder Pain   Numbness    HPI Tanya Sanchez is a 33 y.o. female.   HPI   Patient presents with concerns for right shoulder and arm pain that started yesterday  She also reports numbness and tingling of the right arm   Started yesterday afternoon- she denies injuries or falls  She reports pain started in her thoracic mid spine a few days ago then progressed to her right shoulder She reports the pain is radiating down her shoulder to her hand and she has numbness in her fingers   She reports pain was still present after resting last night   She has been taking excedrin extra strength  She is left hand dominant and has been trying not to move her right arm much since work finished yesterday   She reports a lot of repetitive motions with her right arm at work Normal ROM and negative empty can testing   She has been on steroid but not this year      Past Medical History:  Diagnosis Date   ADHD (attention deficit hyperactivity disorder)    Arthritis    Asthma    Gallstones    Panic attack 2016    Patient Active Problem List   Diagnosis Date Noted   Lumbar herniated disc 08/28/2016   Facet hypertrophy of lumbar region 08/28/2016   Symptomatic cholelithiasis 10/14/2014   Epigastric abdominal pain 10/11/2014   Chronic tension headaches 10/11/2014   Vitamin D deficiency 07/31/2014   Panic attacks 07/30/2014   Poor dentition 07/30/2014   Allergic rhinitis 07/30/2014    Past Surgical History:  Procedure Laterality Date   CHOLECYSTECTOMY N/A 10/15/2014   Procedure: LAPAROSCOPIC CHOLECYSTECTOMY WITH INTRAOPERATIVE CHOLANGIOGRAM;  Surgeon: Jimmye Norman, MD;  Location: [facility] OR;  Service: General;  Laterality: N/A;    OB History   No obstetric history on file.      Home Medications    Prior to Admission  medications   Medication Sig Start Date End Date Taking? Authorizing Provider  predniSONE (DELTASONE) 20 MG tablet Take 60mg  PO daily x 2 days, then40mg  PO daily x 2 days, then 20mg  PO daily x 3 days 05/10/23  Yes Yannet Rincon E, PA-C  albuterol (VENTOLIN HFA) 108 (90 Base) MCG/ACT inhaler Inhale 1-2 puffs into the lungs every 6 (six) hours as needed for wheezing or shortness of breath. 04/03/22   Raspet, Noberto Retort, PA-C  diclofenac (VOLTAREN) 75 MG EC tablet Take 1 tablet (75 mg total) by mouth 2 (two) times daily. 04/06/23   Elson Areas, PA-C    Family History Family History  Problem Relation Age of Onset   Heart disease Maternal Grandmother    Hypertension Maternal Grandmother    Depression Maternal Grandmother    Arthritis Maternal Grandmother    Depression Mother    Cancer Mother    Bladder Cancer Mother    Depression Sister     Social History Social History   Tobacco Use   Smoking status: Never   Smokeless tobacco: Never  Vaping Use   Vaping status: Never Used  Substance Use Topics   Alcohol use: No   Drug use: No     Allergies   Prozac [fluoxetine]   Review of Systems Review of Systems  Musculoskeletal:  Positive for arthralgias and myalgias.  Physical Exam Triage Vital Signs ED Triage Vitals  Encounter Vitals Group     BP 05/10/23 1723 128/84     Systolic BP Percentile --      Diastolic BP Percentile --      Pulse Rate 05/10/23 1723 62     Resp 05/10/23 1723 14     Temp 05/10/23 1723 97.6 F (36.4 C)     Temp Source 05/10/23 1723 Oral     SpO2 05/10/23 1723 98 %     Weight --      Height --      Head Circumference --      Peak Flow --      Pain Score 05/10/23 1726 9     Pain Loc --      Pain Education --      Exclude from Growth Chart --    No data found.  Updated Vital Signs BP 128/84 (BP Location: Left Arm)   Pulse 62   Temp 97.6 F (36.4 C) (Oral)   Resp 14   LMP 05/05/2023   SpO2 98%   Visual Acuity Right Eye Distance:   Left  Eye Distance:   Bilateral Distance:    Right Eye Near:   Left Eye Near:    Bilateral Near:     Physical Exam Vitals reviewed.  Constitutional:      Appearance: Normal appearance.  HENT:     Head: Normocephalic and atraumatic.  Musculoskeletal:     Right shoulder: Tenderness present. No swelling or deformity. Normal range of motion. Normal strength.     Cervical back: Normal range of motion and neck supple.     Comments: Right shoulder ROM is symmetric and intact when compared to the left. Normal ROM with regards to flexion, extension, internal and external rotation, abduction, adduction.  There is notable tenderness along anterior shoulder joint along the acromioclavicular joint.  No notable crepitus with passive ROM. Strength is 5 out of 5 with shoulder shrug and 5 out of 5 with grip strength bilaterally Radial pulses are 2+ bilaterally.   Neurological:     General: No focal deficit present.     Mental Status: She is alert and oriented to person, place, and time.  Psychiatric:        Mood and Affect: Mood normal.        Behavior: Behavior normal.        Thought Content: Thought content normal.        Judgment: Judgment normal.      UC Treatments / Results  Labs (all labs ordered are listed, but only abnormal results are displayed) Labs Reviewed - No data to display  EKG   Radiology No results found.  Procedures Procedures (including critical care time)  Medications Ordered in UC Medications - No data to display  Initial Impression / Assessment and Plan / UC Course  I have reviewed the triage vital signs and the nursing notes.  Pertinent labs & imaging results that were available during my care of the patient were reviewed by me and considered in my medical decision making (see chart for details).      Final Clinical Impressions(s) / UC Diagnoses   Final diagnoses:  Acute pain of right shoulder  Paresthesia   Acute, new concerns Patient who is  left-hand dominant presents with concerns for 1 day of right shoulder pain with associated right arm numbness and tingling. She reports that she does have a lot of repetitive motions at work.  She states that she has been trying to reduce use of her right arm due to pain and discomfort. ROM is overall intact and symmetrical bilaterally.  Strength appears to be 5 out of 5 at shoulder and hand.  At this time suspect overuse injury and inflammation.  Will send in prescription for prednisone taper.  Recommend taking Tylenol as well for further pain management.  Stretches included in after visit summary.  ED and return precautions reviewed and provided in after visit summary.  Follow-up as needed for progressing or persistent symptoms   Discharge Instructions       I recommend the following at this time to help relieve that discomfort:  Rest Warm compresses to the area (20 minutes on, minimum of 30 minutes off) You can alternate Tylenol and Ibuprofen for pain management but Ibuprofen is typically preferred to reduce inflammation.   Gentle stretches and exercises that I have included in your paperwork Try to reduce excess strain to the area and rest as much as possible  Wear supportive shoes and, if you must lift anything, use proper lifting techniques that spare your back.   If these measures do not lead to improvement in your symptoms over the next 2-4 weeks please let us know        ED Prescriptions     Medication Sig Dispense Auth. Provider   predniSONE (DELTASONE) 20 MG tablet Take 60mg  PO daily x 2 days, then40mg  PO daily x 2 days, then 20mg  PO daily x 3 days 13 tablet Alana Dayton E, PA-C      PDMP not reviewed this encounter.   Providence Crosby, PA-C 05/10/23 1821

## 2023-05-10 NOTE — Discharge Instructions (Addendum)
  I recommend the following at this time to help relieve that discomfort:  Rest  Warm compresses to the area (20 minutes on, minimum of 30 minutes off)  You can alternate Tylenol and Ibuprofen for pain management but Ibuprofen is typically preferred to reduce inflammation.   Gentle stretches and exercises that I have included in your paperwork  Try to reduce excess strain to the area and rest as much as possible   Wear supportive shoes and, if you must lift anything, use proper lifting techniques that spare your back.   If these measures do not lead to improvement in your symptoms over the next 2-4 weeks please let us know

## 2023-05-10 NOTE — ED Triage Notes (Signed)
 Patient c/o right shoulder pain that radiates into her right arm since yesterday. Patient states the also has numbness and tingling of the right arm.  Patient states she has been taking Excedrin ES and the last dose was 1300 today.

## 2023-05-30 ENCOUNTER — Other Ambulatory Visit: Payer: Self-pay

## 2023-05-30 ENCOUNTER — Encounter (HOSPITAL_COMMUNITY): Payer: Self-pay | Admitting: Emergency Medicine

## 2023-05-30 ENCOUNTER — Emergency Department (HOSPITAL_COMMUNITY)
Admission: EM | Admit: 2023-05-30 | Discharge: 2023-05-30 | Disposition: A | Discharge status: Home / Self Care | Payer: Self-pay | Attending: Emergency Medicine | Admitting: Emergency Medicine

## 2023-05-30 DIAGNOSIS — L230 Allergic contact dermatitis due to metals: Secondary | ICD-10-CM | POA: Insufficient documentation

## 2023-05-30 MED ORDER — TRIAMCINOLONE ACETONIDE 0.1 % EX CREA: 1.0000 | TOPICAL_CREAM | Freq: Two times a day (BID) | CUTANEOUS | 0 refills | Status: AC

## 2023-05-30 NOTE — ED Triage Notes (Signed)
 Patient reports wearing necklace and then getting pruritic rash on hands and neck where necklace was 2 days ago. Taking benadryl and zyrtec at home with temporary relief.

## 2023-05-30 NOTE — ED Provider Notes (Signed)
 [place] EMERGENCY DEPARTMENT AT [facility] Provider Note   CSN: [id number] Arrival date & time: 05/30/23  0122     History  Chief Complaint  Patient presents with   Rash    Tanya Sanchez is a 33 y.o. female.  Patient is a 33 year old female with no significant past medical history presenting to the emergency department for a rash.  Patient states that she started to wear a necklace for the first time in several years 2 days ago and developed an itchy rash around her neck and on her hands.  She states that she has been taking Benadryl and Zyrtec at home which helps some but the rash keeps coming back.  She states that she feels like her hands are starting to get swollen.  She denies any associated shortness of breath or tongue or throat swelling.  She states that she has stopped wearing the necklace.  The history is provided by the patient.  Rash      Home Medications Prior to Admission medications   Medication Sig Start Date End Date Taking? Authorizing Provider  triamcinolone cream (KENALOG) 0.1 % Apply 1 Application topically 2 (two) times daily. 05/30/23  Yes Theresia Lo, Benetta Spar K, DO  albuterol (VENTOLIN HFA) 108 (90 Base) MCG/ACT inhaler Inhale 1-2 puffs into the lungs every 6 (six) hours as needed for wheezing or shortness of breath. 04/03/22   Raspet, Noberto Retort, PA-C  diclofenac (VOLTAREN) 75 MG EC tablet Take 1 tablet (75 mg total) by mouth 2 (two) times daily. 04/06/23   Elson Areas, PA-C  predniSONE (DELTASONE) 20 MG tablet Take 60mg  PO daily x 2 days, then40mg  PO daily x 2 days, then 20mg  PO daily x 3 days 05/10/23   Mecum, Erin E, PA-C      Allergies    Prozac [fluoxetine]    Review of Systems   Review of Systems  Skin:  Positive for rash.    Physical Exam Updated Vital Signs BP (!) 129/104 (BP Location: Left Arm)   Pulse 92   Temp 98.2 F (36.8 C)   Resp 18   Ht 5\' 4"  (1.626 m)   Wt 68 kg   LMP 05/05/2023   SpO2 98%   BMI 25.75 kg/m   Physical Exam Vitals and nursing note reviewed.  Constitutional:      General: She is not in acute distress.    Appearance: Normal appearance.  HENT:     Head: Normocephalic and atraumatic.     Nose: Nose normal.     Mouth/Throat:     Mouth: Mucous membranes are moist.     Pharynx: Oropharynx is clear.  Eyes:     Extraocular Movements: Extraocular movements intact.  Cardiovascular:     Rate and Rhythm: Normal rate and regular rhythm.     Heart sounds: Normal heart sounds.  Pulmonary:     Effort: Pulmonary effort is normal.     Breath sounds: Normal breath sounds. No stridor. No wheezing.  Abdominal:     General: Abdomen is flat.  Musculoskeletal:        General: Normal range of motion.     Cervical back: Normal range of motion.  Skin:    General: Skin is warm and dry.     Comments: Small urticarial rash to R ulnar aspect of hand and near L 3rd knuckle with swelling, flexion/extension intact Small urticarial rash to proximal chest/lower neck with excoriations, no surrounding erythema, warmth or drainage  Neurological:  General: No focal deficit present.     Mental Status: She is alert and oriented to person, place, and time.  Psychiatric:        Mood and Affect: Mood normal.        Behavior: Behavior normal.     ED Results / Procedures / Treatments   Labs (all labs ordered are listed, but only abnormal results are displayed) Labs Reviewed - No data to display  EKG None  Radiology No results found.  Procedures Procedures    Medications Ordered in ED Medications - No data to display  ED Course/ Medical Decision Making/ A&P                                 Medical Decision Making This patient presents to the ED with chief complaint(s) of rash with no pertinent past medical history which further complicates the presenting complaint. The complaint involves an extensive differential diagnosis and also carries with it a high risk of complications and morbidity.     The differential diagnosis includes contact dermatitis, allergic reaction, no evidence of anaphylaxis    Additional history obtained: Additional history obtained from N/A Records reviewed N/A  ED Course and Reassessment: On patient's arrival she is hemodynamically stable in no acute distress.  She does have small amount of urticarial appearing rash on her bilateral hands and proximal chest with excoriations.  Has no signs of anaphylaxis on exam.  With the localized area appears consistent with a contact dermatitis and will be given steroid cream.  Recommended to continue taking Zyrtec daily and Benadryl as needed and to follow-up with her primary doctor.  She was given strict return precautions.  Independent labs interpretation:  N/A  Independent visualization of imaging: - N/A  Consultation: - Consulted or discussed management/test interpretation w/ external professional: N/A  Consideration for admission or further workup: Patient has no emergent conditions requiring admission or further work-up at this time and is stable for discharge home with primary care follow-up  Social Determinants of health: N/A            Final Clinical Impression(s) / ED Diagnoses Final diagnoses:  Allergic contact dermatitis due to metals    Rx / DC Orders ED Discharge Orders          Ordered    triamcinolone cream (KENALOG) 0.1 %  2 times daily        05/30/23 0852              Rexford Maus, DO 05/30/23 [phone]

## 2023-05-30 NOTE — Discharge Instructions (Signed)
 You were seen in the emergency department for your rash.  This appears consistent to an allergic type of rash related to the necklace that you are wearing.  You can continue to take Zyrtec daily until the rash resolves and I have given you a steroid cream to apply to the rash.  You can take Benadryl as needed for itching.  You should follow-up with your primary doctor in the next few days to have your symptoms rechecked.  You should return to the emergency department if you break out into full body hives, have severe shortness of breath, lip tongue or throat swelling or any other new or concerning symptoms.

## 2023-11-22 ENCOUNTER — Emergency Department (HOSPITAL_COMMUNITY)
Admission: EM | Admit: 2023-11-22 | Discharge: 2023-11-22 | Disposition: A | Discharge status: Home / Self Care | Payer: Self-pay

## 2023-11-22 ENCOUNTER — Other Ambulatory Visit: Payer: Self-pay

## 2023-11-22 ENCOUNTER — Encounter (HOSPITAL_COMMUNITY): Payer: Self-pay | Admitting: Radiology

## 2023-11-22 ENCOUNTER — Emergency Department (HOSPITAL_COMMUNITY): Payer: Self-pay

## 2023-11-22 DIAGNOSIS — W01198A Fall on same level from slipping, tripping and stumbling with subsequent striking against other object, initial encounter: Secondary | ICD-10-CM | POA: Diagnosis not present

## 2023-11-22 DIAGNOSIS — J45909 Unspecified asthma, uncomplicated: Secondary | ICD-10-CM | POA: Insufficient documentation

## 2023-11-22 DIAGNOSIS — S0990XA Unspecified injury of head, initial encounter: Secondary | ICD-10-CM

## 2023-11-22 DIAGNOSIS — S0101XA Laceration without foreign body of scalp, initial encounter: Secondary | ICD-10-CM | POA: Insufficient documentation

## 2023-11-22 DIAGNOSIS — Z23 Encounter for immunization: Secondary | ICD-10-CM | POA: Diagnosis not present

## 2023-11-22 MED ORDER — KETOROLAC TROMETHAMINE 15 MG/ML IJ SOLN
15.0000 mg | Freq: Once | INTRAMUSCULAR | Status: AC
  Administered 2023-11-22: 15 mg via INTRAMUSCULAR
  Filled 2023-11-22: qty 1

## 2023-11-22 MED ORDER — TETANUS-DIPHTH-ACELL PERTUSSIS 5-2.5-18.5 LF-MCG/0.5 IM SUSY
0.5000 mL | PREFILLED_SYRINGE | Freq: Once | INTRAMUSCULAR | Status: AC
  Administered 2023-11-22: 0.5 mL via INTRAMUSCULAR
  Filled 2023-11-22: qty 0.5

## 2023-11-22 MED ORDER — OXYCODONE-ACETAMINOPHEN 5-325 MG PO TABS
1.0000 | ORAL_TABLET | Freq: Once | ORAL | Status: AC
  Administered 2023-11-22: 1 via ORAL
  Filled 2023-11-22: qty 1

## 2023-11-22 NOTE — ED Notes (Signed)
 ..  The patient is A&OX4, ambulatory at d/c with independent steady gait, NAD. Pt verbalized understanding of d/c instructions and follow up care.

## 2023-11-22 NOTE — ED Triage Notes (Signed)
 She was cleaning and slipped and fell hitting the back of her head on the door. No LOC. Neighbor saw her after and felt she wasn't herself due to her behavior. Inch lac to the top of her head. Minimal bleeding at this time.

## 2023-11-22 NOTE — ED Notes (Signed)
 Patient transported to CT

## 2023-11-22 NOTE — Discharge Instructions (Addendum)
 You were seen in the emergency department after a fall with a small cut to your scalp.  While you were here we did a physical exam, CT imaging of your head and neck and repaired your wound.  You had no evidence of skull fracture, brain bleed, or injury to your head or neck.  You have 2 staples that need to be removed in 10 to 14 days by primary care doctor.  I have attached information sheets about head injuries and concussions that you should read.  Get help right away if: You have a severe or worsening headache. You have weakness or numbness in any part of your body, slurred speech, vision changes, or confusion. You vomit repeatedly. You lose consciousness, are sleepier than normal, or are difficult to wake up. You have a seizure.

## 2023-11-22 NOTE — ED Provider Notes (Signed)
 [place] EMERGENCY DEPARTMENT AT [facility] Provider Note HPI Tanya Sanchez is a 33 y.o. female with a medical history as below who presents to the emergency department after ground-level fall.  Patient was in her laundry room when she slipped on some clothes that were on the floor and hit the back of her head on a door hinge.  She noted immediate pain but did not lose consciousness.  She is not on any anticoagulants.  She has not vomited since.  She is complaining of a headache.  She thinks there is a wound on the back of her head because she noticed some blood.  She did not sustain any other injuries during the fall.  Denies preceding dizziness, vision changes, chest pain, palpitations, shortness of breath, abdominal pain  Past Medical History:  Diagnosis Date   ADHD (attention deficit hyperactivity disorder)    Arthritis    Asthma    Gallstones    Panic attack 2016   Past Surgical History:  Procedure Laterality Date   CHOLECYSTECTOMY N/A 10/15/2014   Procedure: LAPAROSCOPIC CHOLECYSTECTOMY WITH INTRAOPERATIVE CHOLANGIOGRAM;  Surgeon: Lynwood Pina, MD;  Location: [facility] OR;  Service: General;  Laterality: N/A;    Review of Systems Pertinent positives and negative findings are listed as part of the History of Present Illness and MDM  Physical Exam Vitals:   11/22/23 1726 11/22/23 1845 11/22/23 1930 11/22/23 1945  BP:    (!) 136/96  Pulse:  92 87 91  Resp:   16 16  Temp:      SpO2: 95% 100% 100% 100%  Weight: 68 kg     Height: 5' 4 (1.626 m)        Constitutional Nursing notes reviewed Vital signs reviewed  HEENT Small laceration to the scalp in the left parietal region, hemostatic Pupils round, equal, and reactive to light. EOMI No obvious trauma to the oropharynx  Back No midline C, T or L-spine tenderness to palpation  Respiratory Effort normal Breathing well on room air CTAB  CV Normal rate and rhythm  No chest wall or clavicular tenderness to palpation   Abdomen Soft, non-tender, non-distended No peritonitis  MSK Atraumatic No obvious deformity ROM appropriate  Neuro Cranial nerves II through XII intact 5/5 symmetric strength in bilateral upper and lower extremities Sensation to light touch intact throughout    MDM:  Initial Differential Diagnoses includes traumatic injury secondary to ground-level fall  I reviewed the patient's vitals, the nursing triage note and evaluated the patient at bedside.   Patient had a purely mechanical fall.  I considered syncope but history is not consistent with this.  Patient slipped on close in her laundry room.  She had no preceding symptoms detailed in the HPI.  Head to toe exam shows a small laceration to the parietal portion of the skull that is hemostatic.  No other injuries seen on head to toe exam.  Patient is neurologically intact with full strength in all 4 extremities.  No midline spinal tenderness to palpation.  Patient given Tdap, pain control and will get a head CT and C-spine CT  Imaging shows no evidence of intracranial abnormality, calvarial fracture, or traumatic injury to the neck.  Patient's wound was repaired with 2 staples.  She was discharged home with information about head injury and concussions  Procedures: .Laceration Repair  Date/Time: 11/22/2023 10:47 PM  Performed by: Dionisio Blunt, MD Authorized by: Ula Prentice SAUNDERS, MD   Consent:    Consent obtained:  Verbal  Consent given by:  Patient   Risks, benefits, and alternatives were discussed: yes     Alternatives discussed:  No treatment, delayed treatment and observation Universal protocol:    Procedure explained and questions answered to patient or proxy's satisfaction: yes     Relevant documents present and verified: yes     Test results available: yes     Imaging studies available: yes     Required blood products, implants, devices, and special equipment available: yes     Patient identity confirmed:   Hospital-assigned identification number Anesthesia:    Anesthesia method:  None Laceration details:    Location:  Scalp   Scalp location:  L parietal   Length (cm):  3   Depth (mm):  5 Pre-procedure details:    Preparation:  Imaging obtained to evaluate for foreign bodies Exploration:    Limited defect created (wound extended): no     Hemostasis achieved with:  Direct pressure   Imaging obtained comment:  CT   Imaging outcome: foreign body not noted     Wound exploration: wound explored through full range of motion and entire depth of wound visualized     Contaminated: no   Treatment:    Area cleansed with:  Saline   Amount of cleaning:  Standard   Irrigation solution:  Sterile saline   Irrigation method:  Syringe   Undermining:  None   Scar revision: no   Skin repair:    Repair method:  Staples   Number of staples:  2 Approximation:    Approximation:  Close Repair type:    Repair type:  Simple Post-procedure details:    Dressing:  Antibiotic ointment   Procedure completion:  Tolerated well, no immediate complications   Medications administered in the ED: Medications  Tdap (BOOSTRIX ) injection 0.5 mL (0.5 mLs Intramuscular Given 11/22/23 1820)  oxyCODONE -acetaminophen  (PERCOCET/ROXICET) 5-325 MG per tablet 1 tablet (1 tablet Oral Given 11/22/23 1819)  ketorolac  (TORADOL ) 15 MG/ML injection 15 mg (15 mg Intramuscular Given 11/22/23 1934)     Impression: 1. Injury of head, initial encounter      Patient's presentation is most consistent with acute presentation with potential threat to life or bodily function.  Disposition: ED Disposition:  Discharge   Discharge: Patient is felt to be medically appropriate for discharge at this time. Patient was instructed to follow up with their primary care doctor/specialists listed above for re-evaluation. Patient was given strict return precautions.  ED Discharge Orders     None             Dionisio Blunt, MD 11/22/23  2249    Ula Prentice SAUNDERS, MD 11/22/23 [phone]

## 2023-12-01 ENCOUNTER — Encounter (HOSPITAL_COMMUNITY): Payer: Self-pay

## 2023-12-01 ENCOUNTER — Ambulatory Visit (HOSPITAL_COMMUNITY)
Admission: EM | Admit: 2023-12-01 | Discharge: 2023-12-01 | Disposition: A | Discharge status: Home / Self Care | Payer: Self-pay | Attending: Family Medicine | Admitting: Family Medicine

## 2023-12-01 DIAGNOSIS — Z4802 Encounter for removal of sutures: Secondary | ICD-10-CM

## 2023-12-01 NOTE — ED Triage Notes (Signed)
 Patient is here for staple removal on the top center of her head. Patient denies any drainage or bleeding from the site.

## 2023-12-01 NOTE — ED Provider Notes (Signed)
 [facility]    CSN: [id number] Arrival date & time: 12/01/23  1445      History   Chief Complaint Chief Complaint  Patient presents with   Suture / Staple Removal    HPI Tanya Sanchez is a 33 y.o. female.    Suture / Staple Removal  Patient is here for staple removal.  She had two placed to the top of the head on 9/1.  No issues today otherwise.  Healing well.   Past Medical History:  Diagnosis Date   ADHD (attention deficit hyperactivity disorder)    Arthritis    Asthma    Gallstones    Panic attack 2016    Patient Active Problem List   Diagnosis Date Noted   Lumbar herniated disc 08/28/2016   Facet hypertrophy of lumbar region 08/28/2016   Symptomatic cholelithiasis 10/14/2014   Epigastric abdominal pain 10/11/2014   Chronic tension headaches 10/11/2014   Vitamin D  deficiency 07/31/2014   Panic attacks 07/30/2014   Poor dentition 07/30/2014   Allergic rhinitis 07/30/2014    Past Surgical History:  Procedure Laterality Date   CHOLECYSTECTOMY N/A 10/15/2014   Procedure: LAPAROSCOPIC CHOLECYSTECTOMY WITH INTRAOPERATIVE CHOLANGIOGRAM;  Surgeon: Lynwood Pina, MD;  Location: [facility] OR;  Service: General;  Laterality: N/A;    OB History   No obstetric history on file.      Home Medications    Prior to Admission medications   Medication Sig Start Date End Date Taking? Authorizing Provider  albuterol  (VENTOLIN  HFA) 108 (90 Base) MCG/ACT inhaler Inhale 1-2 puffs into the lungs every 6 (six) hours as needed for wheezing or shortness of breath. 04/03/22   Raspet, Frankey Botting K, PA-C  diclofenac  (VOLTAREN ) 75 MG EC tablet Take 1 tablet (75 mg total) by mouth 2 (two) times daily. 04/06/23   Sofia, Leslie K, PA-C  predniSONE  (DELTASONE ) 20 MG tablet Take 60mg  PO daily x 2 days, then40mg  PO daily x 2 days, then 20mg  PO daily x 3 days 05/10/23   Mecum, Niharika Savino E, PA-C  triamcinolone  cream (KENALOG ) 0.1 % Apply 1 Application topically 2 (two) times daily. 05/30/23    Ellouise Richerd POUR, DO    Family History Family History  Problem Relation Age of Onset   Heart disease Maternal Grandmother    Hypertension Maternal Grandmother    Depression Maternal Grandmother    Arthritis Maternal Grandmother    Depression Mother    Cancer Mother    Bladder Cancer Mother    Depression Sister     Social History Social History   Tobacco Use   Smoking status: Never   Smokeless tobacco: Never  Vaping Use   Vaping status: Some Days  Substance Use Topics   Alcohol use: No   Drug use: No     Allergies   Prozac  [fluoxetine ]   Review of Systems Review of Systems  Constitutional: Negative.   HENT: Negative.    Respiratory: Negative.    Cardiovascular: Negative.   Gastrointestinal: Negative.   Skin:  Positive for wound.     Physical Exam Triage Vital Signs ED Triage Vitals [12/01/23 1511]  Encounter Vitals Group     BP 117/78     Girls Systolic BP Percentile      Girls Diastolic BP Percentile      Boys Systolic BP Percentile      Boys Diastolic BP Percentile      Pulse Rate 70     Resp 18     Temp 98.2 F (36.8 C)  Temp Source Oral     SpO2 98 %     Weight      Height      Head Circumference      Peak Flow      Pain Score      Pain Loc      Pain Education      Exclude from Growth Chart    No data found.  Updated Vital Signs BP 117/78 (BP Location: Left Arm)   Pulse 70   Temp 98.2 F (36.8 C) (Oral)   Resp 18   LMP 11/22/2023   SpO2 98%   Visual Acuity Right Eye Distance:   Left Eye Distance:   Bilateral Distance:    Right Eye Near:   Left Eye Near:    Bilateral Near:     Physical Exam Constitutional:      Appearance: Normal appearance.  Skin:    Comments: To the top of the scalp are two staples.  No scab or dried blood noted;  area is healed well.   Neurological:     Mental Status: She is alert.      UC Treatments / Results  Labs (all labs ordered are listed, but only abnormal results are  displayed) Labs Reviewed - No data to display  EKG   Radiology No results found.  Procedures Two staples removed in the usual fashion.  Patient tolerated this well.  Wound is c/d/i  Medications Ordered in UC Medications - No data to display  Initial Impression / Assessment and Plan / UC Course  I have reviewed the triage vital signs and the nursing notes.  Pertinent labs & imaging results that were available during my care of the patient were reviewed by me and considered in my medical decision making (see chart for details).    Final Clinical Impressions(s) / UC Diagnoses   Final diagnoses:  Visit for suture removal     Discharge Instructions      You were seen today for staple removal.  You may gently wash your hair/scalp over the next several days.  Return as needed.      ED Prescriptions   None    PDMP not reviewed this encounter.   Darral Longs, MD 12/01/23 [phone]

## 2023-12-01 NOTE — Discharge Instructions (Addendum)
 You were seen today for staple removal.  You may gently wash your hair/scalp over the next several days.  Return as needed.

## 2023-12-05 ENCOUNTER — Encounter (HOSPITAL_COMMUNITY): Payer: Self-pay

## 2023-12-05 ENCOUNTER — Emergency Department (HOSPITAL_COMMUNITY)
Admission: EM | Admit: 2023-12-05 | Discharge: 2023-12-05 | Disposition: A | Discharge status: Home / Self Care | Attending: Emergency Medicine | Admitting: Emergency Medicine

## 2023-12-05 ENCOUNTER — Other Ambulatory Visit: Payer: Self-pay

## 2023-12-05 DIAGNOSIS — M542 Cervicalgia: Secondary | ICD-10-CM | POA: Insufficient documentation

## 2023-12-05 DIAGNOSIS — M7918 Myalgia, other site: Secondary | ICD-10-CM

## 2023-12-05 MED ORDER — METHOCARBAMOL 500 MG PO TABS
750.0000 mg | ORAL_TABLET | Freq: Once | ORAL | Status: AC
  Administered 2023-12-05: 750 mg via ORAL
  Filled 2023-12-05: qty 2

## 2023-12-05 MED ORDER — IBUPROFEN 600 MG PO TABS: 600.0000 mg | ORAL_TABLET | Freq: Four times a day (QID) | ORAL | 0 refills | Status: AC | PRN

## 2023-12-05 MED ORDER — METHOCARBAMOL 500 MG PO TABS: 1000.0000 mg | ORAL_TABLET | Freq: Two times a day (BID) | ORAL | 0 refills | Status: AC

## 2023-12-05 MED ORDER — IBUPROFEN 200 MG PO TABS
600.0000 mg | ORAL_TABLET | Freq: Once | ORAL | Status: AC
  Administered 2023-12-05: 600 mg via ORAL
  Filled 2023-12-05: qty 3

## 2023-12-05 NOTE — Discharge Instructions (Addendum)
 Take Robaxin  and ibuprofen  as prescribed. Warm compresses may also provide some relief for likely muscular pain and soreness.   You are encouraged to follow up with your primary care provider for recheck in one week.  Return to the ED as needed.

## 2023-12-05 NOTE — ED Provider Notes (Signed)
 [place] [facility] AT [facility] Provider Note   CSN: [id number] Arrival date & time: 12/05/23  1940     Patient presents with: Fall and Neck Injury   Tanya Sanchez is a 33 y.o. female.   Patient to ED with c/o neck and upper back pain for the past 2-3 days. She fell on 9/1 and sustained a minor head injury but states symptoms from that injury had completely resolved. No new fall. No pain that extends into he upper extremities. No weakness. She reports it is worse with certain positions or movement.   The history is provided by the patient. No language interpreter was used.  Fall  Neck Injury       Prior to Admission medications   Medication Sig Start Date End Date Taking? Authorizing Provider  ibuprofen  (ADVIL ) 600 MG tablet Take 1 tablet (600 mg total) by mouth every 6 (six) hours as needed. 12/05/23  Yes Kortlynn Poust, Margit, PA-C  methocarbamol  (ROBAXIN ) 500 MG tablet Take 2 tablets (1,000 mg total) by mouth 2 (two) times daily. 12/05/23  Yes Aisea Bouldin, Margit, PA-C  albuterol  (VENTOLIN  HFA) 108 (90 Base) MCG/ACT inhaler Inhale 1-2 puffs into the lungs every 6 (six) hours as needed for wheezing or shortness of breath. 04/03/22   Raspet, Erin K, PA-C  diclofenac  (VOLTAREN ) 75 MG EC tablet Take 1 tablet (75 mg total) by mouth 2 (two) times daily. 04/06/23   Sofia, Leslie K, PA-C  predniSONE  (DELTASONE ) 20 MG tablet Take 60mg  PO daily x 2 days, then40mg  PO daily x 2 days, then 20mg  PO daily x 3 days 05/10/23   Mecum, Erin E, PA-C  triamcinolone  cream (KENALOG ) 0.1 % Apply 1 Application topically 2 (two) times daily. 05/30/23   Kingsley, Victoria K, DO    Allergies: Prozac  [fluoxetine ]    Review of Systems  Updated Vital Signs BP 121/87 (BP Location: Left Arm)   Pulse 91   Temp 98.3 F (36.8 C) (Oral)   Resp 19   Ht 5' 4 (1.626 m)   Wt 69.9 kg   LMP 11/22/2023   SpO2 98%   BMI 26.43 kg/m   Physical Exam Vitals and nursing note reviewed.   Constitutional:      Appearance: Normal appearance.  HENT:     Head: Normocephalic and atraumatic.  Neck:     Comments: There is midline and paracervical tenderness to palpation. She reports pain with turning head side to side.  Cardiovascular:     Rate and Rhythm: Normal rate.  Pulmonary:     Effort: Pulmonary effort is normal.  Musculoskeletal:        General: Normal range of motion.     Cervical back: Neck supple.     Comments: There is no swelling or redness of the upper back. There is generalized tenderness to the musculature between scapulas. FROM UE's.  Skin:    General: Skin is warm and dry.     Findings: No bruising or erythema.  Neurological:     Mental Status: She is oriented to person, place, and time.     Sensory: No sensory deficit.     Motor: No weakness.     Comments: Strength in UE's is full and symmetric.      (all labs ordered are listed, but only abnormal results are displayed) Labs Reviewed - No data to display  EKG: None  Radiology: No results found.   Procedures   Medications Ordered in the ED  ibuprofen  (ADVIL ) tablet 600  mg (600 mg Oral Given 12/05/23 2133)  methocarbamol  (ROBAXIN ) tablet 750 mg (750 mg Oral Given 12/05/23 2133)    Clinical Course as of 12/05/23 2154  Sun Dec 05, 2023  2047 Patient to ED c/o neck and upper back pain x 2-3 days. Concerned it is related to fall of 9/1. Chart reviewed. CT head and c-spine done at time of previous injury on 9/1 are negative for intracranial or neck injury. Neurologic exam today is without deficit - doubt current symptoms related to previous fall, or to radiculopathy. Favor MSK pain. Robaxin  and ibuprofen  recommended. Rx provided.  [SU]    Clinical Course User Index [SU] Odell Balls, PA-C                                 Medical Decision Making Risk OTC drugs. Prescription drug management.        Final diagnoses:  Musculoskeletal pain  Neck pain    ED Discharge Orders           Ordered    ibuprofen  (ADVIL ) 600 MG tablet  Every 6 hours PRN        12/05/23 2151    methocarbamol  (ROBAXIN ) 500 MG tablet  2 times daily        12/05/23 2151               Odell Balls, PA-C 12/05/23 2154    Freddi Hamilton, MD 12/07/23 2019

## 2023-12-05 NOTE — ED Triage Notes (Signed)
 Pt states she had a fall 9/1 and hit her head, unsure of LOC, seen at [facility] and had negative head CT and CT of spine. C/o worsening confusion, headaches, and neck pain.

## 2023-12-05 NOTE — ED Notes (Signed)
 Patient d/c with home care instructions. VS obtained.

## 2024-03-02 ENCOUNTER — Other Ambulatory Visit (HOSPITAL_COMMUNITY): Payer: Self-pay
